# Patient Record
Sex: Male | Born: 1970 | Race: White | Hispanic: No | Marital: Married | Smoking: Never smoker
Health system: Southern US, Community
[De-identification: ages and names within clinical notes are randomized; demographics above are authoritative.]

## PROBLEM LIST (undated history)

## (undated) DIAGNOSIS — I5032 Chronic diastolic (congestive) heart failure: Secondary | ICD-10-CM

## (undated) DIAGNOSIS — R12 Heartburn: Secondary | ICD-10-CM

## (undated) DIAGNOSIS — R600 Localized edema: Secondary | ICD-10-CM

## (undated) DIAGNOSIS — K219 Gastro-esophageal reflux disease without esophagitis: Secondary | ICD-10-CM

## (undated) DIAGNOSIS — I2510 Atherosclerotic heart disease of native coronary artery without angina pectoris: Secondary | ICD-10-CM

## (undated) DIAGNOSIS — M5126 Other intervertebral disc displacement, lumbar region: Secondary | ICD-10-CM

## (undated) DIAGNOSIS — M549 Dorsalgia, unspecified: Secondary | ICD-10-CM

## (undated) DIAGNOSIS — E785 Hyperlipidemia, unspecified: Secondary | ICD-10-CM

## (undated) DIAGNOSIS — I10 Essential (primary) hypertension: Secondary | ICD-10-CM

## (undated) DIAGNOSIS — R609 Edema, unspecified: Secondary | ICD-10-CM

## (undated) DIAGNOSIS — M2550 Pain in unspecified joint: Secondary | ICD-10-CM

## (undated) HISTORY — DX: Dorsalgia, unspecified: M54.9

## (undated) HISTORY — DX: Localized edema: R60.0

## (undated) HISTORY — DX: Pain in unspecified joint: M25.50

## (undated) HISTORY — DX: Heartburn: R12

## (undated) HISTORY — DX: Atherosclerotic heart disease of native coronary artery without angina pectoris: I25.10

## (undated) HISTORY — DX: Edema, unspecified: R60.9

## (undated) HISTORY — DX: Morbid (severe) obesity due to excess calories: E66.01

## (undated) HISTORY — DX: Chronic diastolic (congestive) heart failure: I50.32

## (undated) HISTORY — DX: Hyperlipidemia, unspecified: E78.5

---

## 2000-01-19 ENCOUNTER — Encounter: Admission: RE | Admit: 2000-01-19 | Discharge: 2000-01-19 | Payer: Self-pay | Admitting: Family Medicine

## 2000-01-19 ENCOUNTER — Encounter: Payer: Self-pay | Admitting: Family Medicine

## 2014-12-18 ENCOUNTER — Other Ambulatory Visit: Payer: Self-pay | Admitting: Physician Assistant

## 2014-12-18 ENCOUNTER — Ambulatory Visit
Admission: RE | Admit: 2014-12-18 | Discharge: 2014-12-18 | Disposition: A | Discharge status: Home / Self Care | Payer: 59 | Source: Ambulatory Visit | Attending: Physician Assistant | Admitting: Physician Assistant

## 2014-12-18 DIAGNOSIS — R52 Pain, unspecified: Secondary | ICD-10-CM

## 2015-07-31 ENCOUNTER — Encounter: Payer: Self-pay | Admitting: Cardiology

## 2015-07-31 ENCOUNTER — Ambulatory Visit (INDEPENDENT_AMBULATORY_CARE_PROVIDER_SITE_OTHER): Payer: Commercial Managed Care - HMO | Admitting: Cardiology

## 2015-07-31 ENCOUNTER — Encounter: Payer: Self-pay | Admitting: *Deleted

## 2015-07-31 VITALS — BP 120/72 | HR 89 | Ht >= 80 in | Wt >= 6400 oz

## 2015-07-31 DIAGNOSIS — E785 Hyperlipidemia, unspecified: Secondary | ICD-10-CM | POA: Insufficient documentation

## 2015-07-31 DIAGNOSIS — Z8249 Family history of ischemic heart disease and other diseases of the circulatory system: Secondary | ICD-10-CM

## 2015-07-31 DIAGNOSIS — R079 Chest pain, unspecified: Secondary | ICD-10-CM

## 2015-07-31 DIAGNOSIS — I208 Other forms of angina pectoris: Secondary | ICD-10-CM

## 2015-07-31 MED ORDER — ATORVASTATIN CALCIUM 40 MG PO TABS: 40.0000 mg | ORAL_TABLET | Freq: Every day | ORAL | Status: DC

## 2015-07-31 MED ORDER — NITROGLYCERIN 0.4 MG SL SUBL: 0.4000 mg | SUBLINGUAL_TABLET | SUBLINGUAL | Status: DC | PRN

## 2015-07-31 MED ORDER — ASPIRIN EC 81 MG PO TBEC: 81.0000 mg | DELAYED_RELEASE_TABLET | Freq: Every day | ORAL | Status: AC

## 2015-07-31 NOTE — Progress Notes (Signed)
07/31/2015 Jacob Hunt [facility]   Jul 12, 1970  [id number]  Primary Physician No primary care provider on file. Primary Cardiologist: New (discussed plan with Dr. Patty Sermons)   Reason for Visit/CC: PCP Referral for CP Evaluation   HPI:  The patient is a morbidly obese male who has been referred by [facility] for evaluation for chest pain. His cardiac risk factors include, HLD, obesity and a strong family h/o premature CAD. His mother had a MI at age 64. His father had a MI at age 55. His maternal grandfather suffered a fatal MI and is brother who is 21 y/o recently suffered a MI 1 month ago and got a stent. The patient denies any prior h/o tobacco abuse or DM.  He notes a 3 day history of intermittent substernal chest tightness. Feels like "indigestion". Feels like pressure/burning sensation. Does not radiate. Unrelieved with TUMS. Worse with exertion. Relieved with rest. He is currently CP. EKG shows NSR. BP is well controled at 120/66.   His PCP checked basic labs yesterday. Lipid panel showed an elevated LDL of 132 mg/dL. Hepatic function is normal. BMP showed normal renal function. CBC negative for anemia.      Current Outpatient Prescriptions  Medication Sig Dispense Refill  . aspirin EC 81 MG tablet Take 1 tablet (81 mg total) by mouth daily. 30 tablet 0  . atorvastatin (LIPITOR) 40 MG tablet Take 1 tablet (40 mg total) by mouth daily. 90 tablet 3  . nitroGLYCERIN (NITROSTAT) 0.4 MG SL tablet Place 1 tablet (0.4 mg total) under the tongue every 5 (five) minutes as needed for chest pain. 25 tablet 3   No current facility-administered medications for this visit.    No Known Allergies  Social History   Social History  . Marital Status: Single    Spouse Name: N/A  . Number of Children: N/A  . Years of Education: N/A   Occupational History  . Not on file.   Social History Main Topics  . Smoking status: Never Smoker   . Smokeless tobacco: Not on file  . Alcohol Use:  No  . Drug Use: No  . Sexual Activity: Yes   Other Topics Concern  . Not on file   Social History Narrative     Review of Systems: General: negative for chills, fever, night sweats or weight changes.  Cardiovascular: negative for chest pain, dyspnea on exertion, edema, orthopnea, palpitations, paroxysmal nocturnal dyspnea or shortness of breath Dermatological: negative for rash Respiratory: negative for cough or wheezing Urologic: negative for hematuria Abdominal: negative for nausea, vomiting, diarrhea, bright red blood per rectum, melena, or hematemesis Neurologic: negative for visual changes, syncope, or dizziness All other systems reviewed and are otherwise negative except as noted above.    Blood pressure 120/72, pulse 89, height 7' (2.134 m), weight 419 lb 9.6 oz (190.329 kg).  General appearance: alert, cooperative, no distress and morbidly obese Neck: no carotid bruit and no JVD Lungs: clear to auscultation bilaterally Heart: regular rate and rhythm, S1, S2 normal, no murmur, click, rub or gallop Extremities: no LEE Pulses: 2+ and symmetric Skin: warm and dry Neurologic: Grossly normal  EKG NSR  ASSESSMENT AND PLAN:   1. Exertional Angina: patient with strong family h/o premature CAD, personal history of untreated HLD and morbid obesity w/ symptoms concerning for angina. He is currently CP free. Resting EKG w/o ischemia. BP well controlled. I've discussed case with Dr. Patty Sermons. We will arrange for an outpatient LHC at [facility] on 08/01/15. 81  mg of daily ASA recommended. Rx given for PRN SL NTG.   2. HLD: lipid panel yesterday showed an elevated LDL level of 123 mg/dL. He has cardiac risk factors as well as symptoms concerning for angina. Hepatic function is normal. We will start statin therapy with Lipitor 40 mg. Recheck FLP and HFTs in 6 weeks.   3. Morbid Obesity: weight loss advised, however patient instructed to wait until after cath to engage in physical activity. If  evidence of CAD, we can refer for cardiac rehab.   PLAN  Plan for OP LHC +/- PCI with Dr. Swaziland on 08/02/15.   Robbie Lis PA-C 07/31/2015 3:50 PM

## 2015-07-31 NOTE — Patient Instructions (Addendum)
Medication Instructions:  ATORVASTATIN 40 MG  1 TAB  AT BEDTIME ASPIRIN 81 MG  EVERY DAY  NTG AS  DIRECTED  Labwork: NONE  Testing/Procedures: Your physician has requested that you have a cardiac catheterization. Cardiac catheterization is used to diagnose and/or treat various heart conditions. Doctors may recommend this procedure for a number of different reasons. The most common reason is to evaluate chest pain. Chest pain can be a symptom of coronary artery disease (CAD), and cardiac catheterization can show whether plaque is narrowing or blocking your heart's arteries. This procedure is also used to evaluate the valves, as well as measure the blood flow and oxygen levels in different parts of your heart. For further information please visit [email or web address] Please follow instruction sheet, as given.   Follow-Up:Coronary Angiogram A coronary angiogram, also called coronary angiography, is an X-ray procedure used to look at the arteries in the heart. In this procedure, a dye (contrast dye) is injected through a long, hollow tube (catheter). The catheter is about the size of a piece of cooked spaghetti and is inserted through your groin, wrist, or arm. The dye is injected into each artery, and X-rays are then taken to show if there is a blockage in the arteries of your heart. LET [facility] CARE PROVIDER KNOW ABOUT:  Any allergies you have, including allergies to shellfish or contrast dye.   All medicines you are taking, including vitamins, herbs, eye drops, creams, and over-the-counter medicines.   Previous problems you or members of your family have had with the use of anesthetics.   Any blood disorders you have.   Previous surgeries you have had.  History of kidney problems or failure.   Other medical conditions you have. RISKS AND COMPLICATIONS  Generally, a coronary angiogram is a safe procedure. However, problems can occur and include:  Allergic reaction to the  dye.  Bleeding from the access site or other locations.  Kidney injury, especially in people with impaired kidney function.  Stroke (rare).  Heart attack (rare). BEFORE THE PROCEDURE   Do not eat or drink anything after midnight the night before the procedure or as directed by your health care provider.   Ask your health care provider about changing or stopping your regular medicines. This is especially important if you are taking diabetes medicines or blood thinners. PROCEDURE  You may be given a medicine to help you relax (sedative) before the procedure. This medicine is given through an intravenous (IV) access tube that is inserted into one of your veins.   The area where the catheter will be inserted will be washed and shaved. This is usually done in the groin but may be done in the fold of your arm (near your elbow) or in the wrist.   A medicine will be given to numb the area where the catheter will be inserted (local anesthetic).   The health care provider will insert the catheter into an artery. The catheter will be guided by using a special type of X-ray (fluoroscopy) of the blood vessel being examined.   A special dye will then be injected into the catheter, and X-rays will be taken. The dye will help to show where any narrowing or blockages are located in the heart arteries.  AFTER THE PROCEDURE   If the procedure is done through the leg, you will be kept in bed lying flat for several hours. You will be instructed to not bend or cross your legs.  The insertion site will be  checked frequently.   The pulse in your feet or wrist will be checked frequently.   Additional blood tests, X-rays, and an electrocardiogram may be done.    This information is not intended to replace advice given to you by your health care provider. Make sure you discuss any questions you have with your health care provider.   Document Released: 12/27/2002 Document Revised: 07/13/2014  Document Reviewed: 11/14/2012 Elsevier Interactive Patient Education 2016 [employer].    Any Other Special Instructions Will Be Listed Below (If Applicable). Nitroglycerin sublingual tablets What is this medicine? NITROGLYCERIN (nye troe GLI ser in) is a type of vasodilator. It relaxes blood vessels, increasing the blood and oxygen supply to your heart. This medicine is used to relieve chest pain caused by angina. It is also used to prevent chest pain before activities like climbing stairs, going outdoors in cold weather, or sexual activity. This medicine may be used for other purposes; ask your health care provider or pharmacist if you have questions. What should I tell my health care provider before I take this medicine? They need to know if you have any of these conditions: -anemia -head injury, recent stroke, or bleeding in the brain -liver disease -previous heart attack -an unusual or allergic reaction to nitroglycerin, other medicines, foods, dyes, or preservatives -pregnant or trying to get pregnant -breast-feeding How should I use this medicine? Take this medicine by mouth as needed. At the first sign of an angina attack (chest pain or tightness) place one tablet under your tongue. You can also take this medicine 5 to 10 minutes before an event likely to produce chest pain. Follow the directions on the prescription label. Let the tablet dissolve under the tongue. Do not swallow whole. Replace the dose if you accidentally swallow it. It will help if your mouth is not dry. Saliva around the tablet will help it to dissolve more quickly. Do not eat or drink, smoke or chew tobacco while a tablet is dissolving. If you are not better within 5 minutes after taking ONE dose of nitroglycerin, call 9-1-1 immediately to seek emergency medical care. Do not take more than 3 nitroglycerin tablets over 15 minutes. If you take this medicine often to relieve symptoms of angina, your doctor or health  care professional may provide you with different instructions to manage your symptoms. If symptoms do not go away after following these instructions, it is important to call 9-1-1 immediately. Do not take more than 3 nitroglycerin tablets over 15 minutes. Talk to your pediatrician regarding the use of this medicine in children. Special care may be needed. Overdosage: If you think you have taken too much of this medicine contact a poison control center or emergency room at once. NOTE: This medicine is only for you. Do not share this medicine with others. What if I miss a dose? This does not apply. This medicine is only used as needed. What may interact with this medicine? Do not take this medicine with any of the following medications: -certain migraine medicines like ergotamine and dihydroergotamine (DHE) -medicines used to treat erectile dysfunction like sildenafil, tadalafil, and vardenafil -riociguat This medicine may also interact with the following medications: -alteplase -aspirin -heparin -medicines for high blood pressure -medicines for mental depression -other medicines used to treat angina -phenothiazines like chlorpromazine, mesoridazine, prochlorperazine, thioridazine This list may not describe all possible interactions. Give your health care provider a list of all the medicines, herbs, non-prescription drugs, or dietary supplements you use. Also tell them  if you smoke, drink alcohol, or use illegal drugs. Some items may interact with your medicine. What should I watch for while using this medicine? Tell your doctor or health care professional if you feel your medicine is no longer working. Keep this medicine with you at all times. Sit or lie down when you take your medicine to prevent falling if you feel dizzy or faint after using it. Try to remain calm. This will help you to feel better faster. If you feel dizzy, take several deep breaths and lie down with your feet propped up, or  bend forward with your head resting between your knees. You may get drowsy or dizzy. Do not drive, use machinery, or do anything that needs mental alertness until you know how this drug affects you. Do not stand or sit up quickly, especially if you are an older patient. This reduces the risk of dizzy or fainting spells. Alcohol can make you more drowsy and dizzy. Avoid alcoholic drinks. Do not treat yourself for coughs, colds, or pain while you are taking this medicine without asking your doctor or health care professional for advice. Some ingredients may increase your blood pressure. What side effects may I notice from receiving this medicine? Side effects that you should report to your doctor or health care professional as soon as possible: -blurred vision -dry mouth -skin rash -sweating -the feeling of extreme pressure in the head -unusually weak or tired Side effects that usually do not require medical attention (report to your doctor or health care professional if they continue or are bothersome): -flushing of the face or neck -headache -irregular heartbeat, palpitations -nausea, vomiting This list may not describe all possible side effects. Call your doctor for medical advice about side effects. You may report side effects to FDA at 1-800-FDA-1088. Where should I keep my medicine? Keep out of the reach of children. Store at room temperature between 20 and 25 degrees C (68 and 77 degrees F). Store in Retail buyer. Protect from light and moisture. Keep tightly closed. Throw away any unused medicine after the expiration date. NOTE: This sheet is a summary. It may not cover all possible information. If you have questions about this medicine, talk to your doctor, pharmacist, or health care provider.    2016, Elsevier/Gold Standard. (2013-04-20 17:57:36)      If you need a refill on your cardiac medications before your next appointment, please call your pharmacy.

## 2015-08-02 ENCOUNTER — Encounter (HOSPITAL_COMMUNITY): Payer: Self-pay | Admitting: Cardiology

## 2015-08-02 ENCOUNTER — Ambulatory Visit (HOSPITAL_COMMUNITY)
Admission: RE | Admit: 2015-08-02 | Discharge: 2015-08-02 | Disposition: A | Discharge status: Home / Self Care | Payer: Commercial Managed Care - HMO | Source: Ambulatory Visit | Attending: Cardiology | Admitting: Cardiology

## 2015-08-02 ENCOUNTER — Encounter (HOSPITAL_COMMUNITY)
Admission: RE | Disposition: A | Discharge status: Home / Self Care | Payer: Self-pay | Source: Ambulatory Visit | Attending: Cardiology

## 2015-08-02 DIAGNOSIS — Z7982 Long term (current) use of aspirin: Secondary | ICD-10-CM | POA: Insufficient documentation

## 2015-08-02 DIAGNOSIS — R079 Chest pain, unspecified: Secondary | ICD-10-CM | POA: Diagnosis present

## 2015-08-02 DIAGNOSIS — I208 Other forms of angina pectoris: Secondary | ICD-10-CM | POA: Diagnosis not present

## 2015-08-02 DIAGNOSIS — Z6841 Body Mass Index (BMI) 40.0 and over, adult: Secondary | ICD-10-CM | POA: Diagnosis not present

## 2015-08-02 DIAGNOSIS — Z8249 Family history of ischemic heart disease and other diseases of the circulatory system: Secondary | ICD-10-CM | POA: Diagnosis not present

## 2015-08-02 DIAGNOSIS — E785 Hyperlipidemia, unspecified: Secondary | ICD-10-CM | POA: Diagnosis present

## 2015-08-02 HISTORY — PX: CARDIAC CATHETERIZATION: SHX172

## 2015-08-02 LAB — PROTIME-INR
INR: 1.21 (ref 0.00–1.49)
Prothrombin Time: 15.4 seconds — ABNORMAL HIGH (ref 11.6–15.2)

## 2015-08-02 SURGERY — LEFT HEART CATH AND CORONARY ANGIOGRAPHY

## 2015-08-02 MED ORDER — HEPARIN (PORCINE) IN NACL 2-0.9 UNIT/ML-% IJ SOLN
INTRAMUSCULAR | Status: AC
  Filled 2015-08-02: qty 1000

## 2015-08-02 MED ORDER — SODIUM CHLORIDE 0.9 % IV SOLN: 250.0000 mL | INTRAVENOUS | Status: DC | PRN

## 2015-08-02 MED ORDER — IOHEXOL 350 MG/ML SOLN
INTRAVENOUS | Status: DC | PRN
  Administered 2015-08-02: 80 mL via INTRA_ARTERIAL

## 2015-08-02 MED ORDER — SODIUM CHLORIDE 0.9% FLUSH: 3.0000 mL | Freq: Two times a day (BID) | INTRAVENOUS | Status: DC

## 2015-08-02 MED ORDER — HEPARIN SODIUM (PORCINE) 1000 UNIT/ML IJ SOLN
INTRAMUSCULAR | Status: DC | PRN
  Administered 2015-08-02: 6000 [IU] via INTRAVENOUS

## 2015-08-02 MED ORDER — HEPARIN SODIUM (PORCINE) 1000 UNIT/ML IJ SOLN
INTRAMUSCULAR | Status: AC
  Filled 2015-08-02: qty 1

## 2015-08-02 MED ORDER — SODIUM CHLORIDE 0.9 % WEIGHT BASED INFUSION: 1.0000 mL/kg/h | INTRAVENOUS | Status: DC

## 2015-08-02 MED ORDER — VERAPAMIL HCL 2.5 MG/ML IV SOLN
INTRAVENOUS | Status: AC
  Filled 2015-08-02: qty 2

## 2015-08-02 MED ORDER — LIDOCAINE HCL (PF) 1 % IJ SOLN
INTRAMUSCULAR | Status: DC | PRN
  Administered 2015-08-02: 10:00:00

## 2015-08-02 MED ORDER — SODIUM CHLORIDE 0.9 % WEIGHT BASED INFUSION
3.0000 mL/kg/h | INTRAVENOUS | Status: AC
  Administered 2015-08-02: 3 mL/kg/h via INTRAVENOUS

## 2015-08-02 MED ORDER — LIDOCAINE HCL (PF) 1 % IJ SOLN
INTRAMUSCULAR | Status: AC
  Filled 2015-08-02: qty 30

## 2015-08-02 MED ORDER — MIDAZOLAM HCL 2 MG/2ML IJ SOLN
INTRAMUSCULAR | Status: DC | PRN
  Administered 2015-08-02: 2 mg via INTRAVENOUS

## 2015-08-02 MED ORDER — SODIUM CHLORIDE 0.9% FLUSH
3.0000 mL | INTRAVENOUS | Status: DC | PRN
Start: 2015-08-02 — End: 2015-08-02

## 2015-08-02 MED ORDER — HEPARIN (PORCINE) IN NACL 2-0.9 UNIT/ML-% IJ SOLN
INTRAMUSCULAR | Status: AC
  Filled 2015-08-02: qty 500

## 2015-08-02 MED ORDER — FENTANYL CITRATE (PF) 100 MCG/2ML IJ SOLN
INTRAMUSCULAR | Status: AC
  Filled 2015-08-02: qty 2

## 2015-08-02 MED ORDER — FENTANYL CITRATE (PF) 100 MCG/2ML IJ SOLN
INTRAMUSCULAR | Status: DC | PRN
Start: 2015-08-02 — End: 2015-08-02
  Administered 2015-08-02: 25 ug via INTRAVENOUS

## 2015-08-02 MED ORDER — HEPARIN (PORCINE) IN NACL 2-0.9 UNIT/ML-% IJ SOLN
INTRAMUSCULAR | Status: DC | PRN
  Administered 2015-08-02: 10:00:00 via INTRA_ARTERIAL

## 2015-08-02 MED ORDER — ASPIRIN 81 MG PO CHEW
81.0000 mg | CHEWABLE_TABLET | ORAL | Status: DC
Start: 2015-08-02 — End: 2015-08-02

## 2015-08-02 MED ORDER — MIDAZOLAM HCL 2 MG/2ML IJ SOLN
INTRAMUSCULAR | Status: AC
  Filled 2015-08-02: qty 2

## 2015-08-02 MED ORDER — SODIUM CHLORIDE 0.9% FLUSH: 3.0000 mL | INTRAVENOUS | Status: DC | PRN

## 2015-08-02 SURGICAL SUPPLY — 11 items

## 2015-08-02 NOTE — H&P (View-Only) (Signed)
 07/31/2015 Jacob Hunt   03/17/1971  [id number]  Primary Physician No primary care provider on file. Primary Cardiologist: New (discussed plan with Dr. Brackbill)   Reason for Visit/CC: PCP Referral for CP Evaluation   HPI:  The patient is a morbidly obese male who has been referred by [facility] for evaluation for chest pain. His cardiac risk factors include, HLD, obesity and a strong family h/o premature CAD. His mother had a MI at age 50. His father had a MI at age 60. His maternal grandfather suffered a fatal MI and is brother who is 50 y/o recently suffered a MI 1 month ago and got a stent. The patient denies any prior h/o tobacco abuse or DM.  He notes a 3 day history of intermittent substernal chest tightness. Feels like "indigestion". Feels like pressure/burning sensation. Does not radiate. Unrelieved with TUMS. Worse with exertion. Relieved with rest. He is currently CP. EKG shows NSR. BP is well controled at 120/66.   His PCP checked basic labs yesterday. Lipid panel showed an elevated LDL of 132 mg/dL. Hepatic function is normal. BMP showed normal renal function. CBC negative for anemia.      Current Outpatient Prescriptions  Medication Sig Dispense Refill  . aspirin EC 81 MG tablet Take 1 tablet (81 mg total) by mouth daily. 30 tablet 0  . atorvastatin (LIPITOR) 40 MG tablet Take 1 tablet (40 mg total) by mouth daily. 90 tablet 3  . nitroGLYCERIN (NITROSTAT) 0.4 MG SL tablet Place 1 tablet (0.4 mg total) under the tongue every 5 (five) minutes as needed for chest pain. 25 tablet 3   No current facility-administered medications for this visit.    No Known Allergies  Social History   Social History  . Marital Status: Single    Spouse Name: N/A  . Number of Children: N/A  . Years of Education: N/A   Occupational History  . Not on file.   Social History Main Topics  . Smoking status: Never Smoker   . Smokeless tobacco: Not on file  . Alcohol Use:  No  . Drug Use: No  . Sexual Activity: Yes   Other Topics Concern  . Not on file   Social History Narrative     Review of Systems: General: negative for chills, fever, night sweats or weight changes.  Cardiovascular: negative for chest pain, dyspnea on exertion, edema, orthopnea, palpitations, paroxysmal nocturnal dyspnea or shortness of breath Dermatological: negative for rash Respiratory: negative for cough or wheezing Urologic: negative for hematuria Abdominal: negative for nausea, vomiting, diarrhea, bright red blood per rectum, melena, or hematemesis Neurologic: negative for visual changes, syncope, or dizziness All other systems reviewed and are otherwise negative except as noted above.    Blood pressure 120/72, pulse 89, height 7' (2.134 m), weight 419 lb 9.6 oz (190.329 kg).  General appearance: alert, cooperative, no distress and morbidly obese Neck: no carotid bruit and no JVD Lungs: clear to auscultation bilaterally Heart: regular rate and rhythm, S1, S2 normal, no murmur, click, rub or gallop Extremities: no LEE Pulses: 2+ and symmetric Skin: warm and dry Neurologic: Grossly normal  EKG NSR  ASSESSMENT AND PLAN:   1. Exertional Angina: patient with strong family h/o premature CAD, personal history of untreated HLD and morbid obesity w/ symptoms concerning for angina. He is currently CP free. Resting EKG w/o ischemia. BP well controlled. I've discussed case with Dr. Brackbill. We will arrange for an outpatient LHC at [facility] on 08/01/15. 81   mg of daily ASA recommended. Rx given for PRN SL NTG.   2. HLD: lipid panel yesterday showed an elevated LDL level of 123 mg/dL. He has cardiac risk factors as well as symptoms concerning for angina. Hepatic function is normal. We will start statin therapy with Lipitor 40 mg. Recheck FLP and HFTs in 6 weeks.   3. Morbid Obesity: weight loss advised, however patient instructed to wait until after cath to engage in physical activity. If  evidence of CAD, we can refer for cardiac rehab.   PLAN  Plan for OP LHC +/- PCI with Dr. Jordan on 08/02/15.   SIMMONS, BRITTAINY PA-C 07/31/2015 3:50 PM   

## 2015-08-02 NOTE — Research (Signed)
CADLAD Informed Consent   Subject Name: Jacob Hunt [facility]  Subject met inclusion and exclusion criteria.  The informed consent form, study requirements and expectations were reviewed with the subject and questions and concerns were addressed prior to the signing of the consent form.  The subject verbalized understanding of the trail requirements.  The subject agreed to participate in the CADLAD trial and signed the informed consent.  The informed consent was obtained prior to performance of any protocol-specific procedures for the subject.  A copy of the signed informed consent was given to the subject and a copy was placed in the subject's medical record.  [name] 08/02/2015, 0825 am

## 2015-08-02 NOTE — Interval H&P Note (Signed)
History and Physical Interval Note:  08/02/2015 9:38 AM  Jacob Hunt  has presented today for surgery, with the diagnosis of cp  The various methods of treatment have been discussed with the patient and family. After consideration of risks, benefits and other options for treatment, the patient has consented to  Procedure(s): Left Heart Cath and Coronary Angiography (N/A) as a surgical intervention .  The patient's history has been reviewed, patient examined, no change in status, stable for surgery.  I have reviewed the patient's chart and labs.  Questions were answered to the patient's satisfaction.     Theron [facility] 08/02/2015 9:38 AM Cath Lab Visit (complete for each Cath Lab visit)  Clinical Evaluation Leading to the Procedure:   ACS: No.  Non-ACS:    Anginal Classification: CCS III  Anti-ischemic medical therapy: No Therapy  Non-Invasive Test Results: No non-invasive testing performed  Prior CABG: No previous CABG

## 2015-08-02 NOTE — Discharge Instructions (Signed)
Radial Site Care °Refer to this sheet in the next few weeks. These instructions provide you with information about caring for yourself after your procedure. Your health care provider may also give you more specific instructions. Your treatment has been planned according to current medical practices, but problems sometimes occur. Call your health care provider if you have any problems or questions after your procedure. °WHAT TO EXPECT AFTER THE PROCEDURE °After your procedure, it is typical to have the following: °· Bruising at the radial site that usually fades within 1-2 weeks. °· Blood collecting in the tissue (hematoma) that may be painful to the touch. It should usually decrease in size and tenderness within 1-2 weeks. °HOME CARE INSTRUCTIONS °· Take medicines only as directed by your health care provider. °· You may shower 24-48 hours after the procedure or as directed by your health care provider. Remove the bandage (dressing) and gently wash the site with plain soap and water. Pat the area dry with a clean towel. Do not rub the site, because this may cause bleeding. °· Do not take baths, swim, or use a hot tub until your health care provider approves. °· Check your insertion site every day for redness, swelling, or drainage. °· Do not apply powder or lotion to the site. °· Do not flex or bend the affected arm for 24 hours or as directed by your health care provider. °· Do not push or pull heavy objects with the affected arm for 24 hours or as directed by your health care provider. °· Do not lift over 10 lb (4.5 kg) for 5 days after your procedure or as directed by your health care provider. °· Ask your health care provider when it is okay to: °¨ Return to work or school. °¨ Resume usual physical activities or sports. °¨ Resume sexual activity. °· Do not drive home if you are discharged the same day as the procedure. Have someone else drive you. °· You may drive 24 hours after the procedure unless otherwise  instructed by your health care provider. °· Do not operate machinery or power tools for 24 hours after the procedure. °· If your procedure was done as an outpatient procedure, which means that you went home the same day as your procedure, a responsible adult should be with you for the first 24 hours after you arrive home. °· Keep all follow-up visits as directed by your health care provider. This is important. °SEEK MEDICAL CARE IF: °· You have a fever. °· You have chills. °· You have increased bleeding from the radial site. Hold pressure on the site. °SEEK IMMEDIATE MEDICAL CARE IF: °· You have unusual pain at the radial site. °· You have redness, warmth, or swelling at the radial site. °· You have drainage (other than a small amount of blood on the dressing) from the radial site. °· The radial site is bleeding, and the bleeding does not stop after 30 minutes of holding steady pressure on the site. °· Your arm or hand becomes pale, cool, tingly, or numb. °  °This information is not intended to replace advice given to you by your health care provider. Make sure you discuss any questions you have with your health care provider. °  °Document Released: 07/25/2010 Document Revised: 07/13/2014 Document Reviewed: 01/08/2014 °Elsevier Interactive Patient Education ©2016 Elsevier Inc. ° °

## 2015-08-08 ENCOUNTER — Telehealth: Payer: Self-pay | Admitting: Cardiology

## 2015-08-08 NOTE — Telephone Encounter (Signed)
Pt wife calling was told to call if any problems-pt having some puss coming out of wound site, a little red, and a little swollen, no pain-doesn't look infected pls advise [phone]

## 2015-08-08 NOTE — Telephone Encounter (Signed)
Returned call.  Pt had cath on 1/27 -- now having clear drainage from cath site. This is the first day he has noticed it. Notes mild swelling, mild redness. Denies pain.  Aware I am routing to physician to advise.

## 2015-08-09 NOTE — Telephone Encounter (Signed)
Would ask the patient to come in and have a provider look at the cath site to evaluate ifnit is infected or not. Looks like Dr. Swazil[place] did the cath.  Dr. Rennis Golden

## 2015-08-09 NOTE — Telephone Encounter (Signed)
Returned call. Phone rings w/ no answer or VM pickup. 

## 2016-03-20 ENCOUNTER — Other Ambulatory Visit: Payer: Self-pay | Admitting: Orthopaedic Surgery

## 2016-03-20 DIAGNOSIS — T1590XA Foreign body on external eye, part unspecified, unspecified eye, initial encounter: Secondary | ICD-10-CM

## 2016-03-20 DIAGNOSIS — M5416 Radiculopathy, lumbar region: Secondary | ICD-10-CM

## 2016-03-20 DIAGNOSIS — M545 Low back pain: Secondary | ICD-10-CM

## 2016-03-23 ENCOUNTER — Ambulatory Visit
Admission: RE | Admit: 2016-03-23 | Discharge: 2016-03-23 | Disposition: A | Discharge status: Home / Self Care | Payer: Commercial Managed Care - HMO | Source: Ambulatory Visit | Attending: Orthopaedic Surgery | Admitting: Orthopaedic Surgery

## 2016-03-23 DIAGNOSIS — M545 Low back pain: Secondary | ICD-10-CM

## 2016-03-23 DIAGNOSIS — T1590XA Foreign body on external eye, part unspecified, unspecified eye, initial encounter: Secondary | ICD-10-CM

## 2016-03-23 DIAGNOSIS — M5416 Radiculopathy, lumbar region: Secondary | ICD-10-CM

## 2016-08-10 DIAGNOSIS — G479 Sleep disorder, unspecified: Secondary | ICD-10-CM | POA: Diagnosis not present

## 2017-03-15 ENCOUNTER — Other Ambulatory Visit (HOSPITAL_COMMUNITY): Payer: Self-pay

## 2017-03-15 ENCOUNTER — Observation Stay (HOSPITAL_COMMUNITY)
Admission: EM | Admit: 2017-03-15 | Discharge: 2017-03-16 | Disposition: A | Discharge status: Home / Self Care | Payer: 59 | Attending: Cardiology | Admitting: Cardiology

## 2017-03-15 ENCOUNTER — Other Ambulatory Visit: Payer: Self-pay

## 2017-03-15 ENCOUNTER — Encounter (HOSPITAL_COMMUNITY)
Admission: EM | Disposition: A | Discharge status: Home / Self Care | Payer: Self-pay | Source: Home / Self Care | Attending: Physician Assistant

## 2017-03-15 ENCOUNTER — Telehealth: Payer: Self-pay | Admitting: Cardiology

## 2017-03-15 ENCOUNTER — Encounter (HOSPITAL_COMMUNITY): Payer: Self-pay | Admitting: *Deleted

## 2017-03-15 ENCOUNTER — Emergency Department (HOSPITAL_COMMUNITY): Payer: 59

## 2017-03-15 DIAGNOSIS — Z7982 Long term (current) use of aspirin: Secondary | ICD-10-CM | POA: Diagnosis not present

## 2017-03-15 DIAGNOSIS — Z8249 Family history of ischemic heart disease and other diseases of the circulatory system: Secondary | ICD-10-CM

## 2017-03-15 DIAGNOSIS — Z6841 Body Mass Index (BMI) 40.0 and over, adult: Secondary | ICD-10-CM | POA: Diagnosis not present

## 2017-03-15 DIAGNOSIS — I214 Non-ST elevation (NSTEMI) myocardial infarction: Secondary | ICD-10-CM

## 2017-03-15 DIAGNOSIS — R079 Chest pain, unspecified: Secondary | ICD-10-CM | POA: Diagnosis not present

## 2017-03-15 DIAGNOSIS — I10 Essential (primary) hypertension: Secondary | ICD-10-CM

## 2017-03-15 DIAGNOSIS — E876 Hypokalemia: Secondary | ICD-10-CM | POA: Diagnosis not present

## 2017-03-15 DIAGNOSIS — R7989 Other specified abnormal findings of blood chemistry: Principal | ICD-10-CM | POA: Insufficient documentation

## 2017-03-15 DIAGNOSIS — E782 Mixed hyperlipidemia: Secondary | ICD-10-CM

## 2017-03-15 DIAGNOSIS — E785 Hyperlipidemia, unspecified: Secondary | ICD-10-CM | POA: Insufficient documentation

## 2017-03-15 DIAGNOSIS — R778 Other specified abnormalities of plasma proteins: Secondary | ICD-10-CM | POA: Diagnosis present

## 2017-03-15 DIAGNOSIS — R748 Abnormal levels of other serum enzymes: Secondary | ICD-10-CM | POA: Diagnosis not present

## 2017-03-15 HISTORY — DX: Essential (primary) hypertension: I10

## 2017-03-15 HISTORY — DX: Gastro-esophageal reflux disease without esophagitis: K21.9

## 2017-03-15 HISTORY — DX: Other intervertebral disc displacement, lumbar region: M51.26

## 2017-03-15 HISTORY — PX: LEFT HEART CATH AND CORONARY ANGIOGRAPHY: CATH118249

## 2017-03-15 HISTORY — PX: CARDIAC CATHETERIZATION: SHX172

## 2017-03-15 LAB — BASIC METABOLIC PANEL
Anion gap: 8 (ref 5–15)
BUN: 10 mg/dL (ref 6–20)
CALCIUM: 9.5 mg/dL (ref 8.9–10.3)
CO2: 24 mmol/L (ref 22–32)
CREATININE: 0.82 mg/dL (ref 0.61–1.24)
Chloride: 104 mmol/L (ref 101–111)
GFR calc Af Amer: >60 mL/min (ref >60)
GFR calc non Af Amer: >60 mL/min (ref >60)
GLUCOSE: 118 mg/dL — AB (ref 65–99)
POTASSIUM: 4.7 mmol/L (ref 3.5–5.1)
Sodium: 136 mmol/L (ref 135–145)

## 2017-03-15 LAB — CBC
HCT: 43.5 % (ref 39.0–52.0)
Hemoglobin: 14.6 g/dL (ref 13.0–17.0)
MCH: 29.8 pg (ref 26.0–34.0)
MCHC: 33.6 g/dL (ref 30.0–36.0)
MCV: 88.8 fL (ref 78.0–100.0)
PLATELETS: 239 10*3/uL (ref 150–400)
RBC: 4.9 MIL/uL (ref 4.22–5.81)
RDW: 12.9 % (ref 11.5–15.5)
WBC: 7.3 10*3/uL (ref 4.0–10.5)

## 2017-03-15 LAB — TROPONIN I: Troponin I: 6.29 ng/mL (ref <0.03)

## 2017-03-15 LAB — I-STAT TROPONIN, ED: TROPONIN I, POC: 0.66 ng/mL — AB (ref 0.00–0.08)

## 2017-03-15 LAB — PROTIME-INR
INR: 1.19
Prothrombin Time: 15 seconds (ref 11.4–15.2)

## 2017-03-15 SURGERY — LEFT HEART CATH AND CORONARY ANGIOGRAPHY
Anesthesia: LOCAL

## 2017-03-15 MED ORDER — SODIUM CHLORIDE 0.9 % IV SOLN: INTRAVENOUS | Status: AC

## 2017-03-15 MED ORDER — HEPARIN (PORCINE) IN NACL 100-0.45 UNIT/ML-% IJ SOLN
1600.0000 [IU]/h | INTRAMUSCULAR | Status: DC
  Administered 2017-03-15: 1600 [IU]/h via INTRAVENOUS
  Filled 2017-03-15 (×2): qty 250

## 2017-03-15 MED ORDER — ACETAMINOPHEN 325 MG PO TABS: 650.0000 mg | ORAL_TABLET | ORAL | Status: DC | PRN

## 2017-03-15 MED ORDER — SODIUM CHLORIDE 0.9% FLUSH: 3.0000 mL | INTRAVENOUS | Status: DC | PRN

## 2017-03-15 MED ORDER — ATORVASTATIN CALCIUM 80 MG PO TABS
80.0000 mg | ORAL_TABLET | Freq: Every day | ORAL | Status: DC
  Administered 2017-03-15: 80 mg via ORAL
  Filled 2017-03-15 (×2): qty 1

## 2017-03-15 MED ORDER — ASPIRIN EC 81 MG PO TBEC
81.0000 mg | DELAYED_RELEASE_TABLET | Freq: Every day | ORAL | Status: DC
Start: 2017-03-16 — End: 2017-03-16
  Administered 2017-03-16: 11:00:00 81 mg via ORAL
  Filled 2017-03-15: qty 1

## 2017-03-15 MED ORDER — SODIUM CHLORIDE 0.9 % IV SOLN: INTRAVENOUS | Status: DC

## 2017-03-15 MED ORDER — ONDANSETRON HCL 4 MG/2ML IJ SOLN: 4.0000 mg | Freq: Four times a day (QID) | INTRAMUSCULAR | Status: DC | PRN

## 2017-03-15 MED ORDER — IOPAMIDOL (ISOVUE-370) INJECTION 76%
INTRAVENOUS | Status: AC
  Filled 2017-03-15: qty 100

## 2017-03-15 MED ORDER — SODIUM CHLORIDE 0.9% FLUSH
3.0000 mL | Freq: Two times a day (BID) | INTRAVENOUS | Status: DC
  Administered 2017-03-16: 3 mL via INTRAVENOUS

## 2017-03-15 MED ORDER — SODIUM CHLORIDE 0.9 % IV SOLN: 250.0000 mL | INTRAVENOUS | Status: DC | PRN

## 2017-03-15 MED ORDER — HEPARIN SODIUM (PORCINE) 1000 UNIT/ML IJ SOLN
INTRAMUSCULAR | Status: AC
  Filled 2017-03-15: qty 1

## 2017-03-15 MED ORDER — HEPARIN BOLUS VIA INFUSION
4000.0000 [IU] | Freq: Once | INTRAVENOUS | Status: AC
  Administered 2017-03-15: 4000 [IU] via INTRAVENOUS
  Filled 2017-03-15: qty 4000

## 2017-03-15 MED ORDER — HEPARIN SODIUM (PORCINE) 1000 UNIT/ML IJ SOLN
INTRAMUSCULAR | Status: DC | PRN
  Administered 2017-03-15: 8000 [IU] via INTRAVENOUS

## 2017-03-15 MED ORDER — NITROGLYCERIN 0.4 MG SL SUBL
0.4000 mg | SUBLINGUAL_TABLET | SUBLINGUAL | Status: DC | PRN
Start: 2017-03-15 — End: 2017-03-16

## 2017-03-15 MED ORDER — ASPIRIN 81 MG PO CHEW: 81.0000 mg | CHEWABLE_TABLET | ORAL | Status: DC

## 2017-03-15 MED ORDER — FENTANYL CITRATE (PF) 100 MCG/2ML IJ SOLN
INTRAMUSCULAR | Status: AC
  Filled 2017-03-15: qty 2

## 2017-03-15 MED ORDER — VERAPAMIL HCL 2.5 MG/ML IV SOLN
INTRAVENOUS | Status: DC | PRN
  Administered 2017-03-15: 18:00:00 via INTRA_ARTERIAL

## 2017-03-15 MED ORDER — HEPARIN (PORCINE) IN NACL 2-0.9 UNIT/ML-% IJ SOLN
INTRAMUSCULAR | Status: AC
  Filled 2017-03-15: qty 1000

## 2017-03-15 MED ORDER — SODIUM CHLORIDE 0.9% FLUSH: 3.0000 mL | Freq: Two times a day (BID) | INTRAVENOUS | Status: DC

## 2017-03-15 MED ORDER — LOSARTAN POTASSIUM 25 MG PO TABS
25.0000 mg | ORAL_TABLET | Freq: Every day | ORAL | Status: DC
  Administered 2017-03-16: 11:00:00 25 mg via ORAL
  Filled 2017-03-15: qty 1

## 2017-03-15 MED ORDER — ASPIRIN EC 81 MG PO TBEC
81.0000 mg | DELAYED_RELEASE_TABLET | Freq: Every day | ORAL | Status: DC
  Administered 2017-03-15: 81 mg via ORAL
  Filled 2017-03-15: qty 1

## 2017-03-15 MED ORDER — LIDOCAINE HCL (PF) 1 % IJ SOLN
INTRAMUSCULAR | Status: DC | PRN
  Administered 2017-03-15: 4 mL via INTRADERMAL

## 2017-03-15 MED ORDER — MIDAZOLAM HCL 2 MG/2ML IJ SOLN
INTRAMUSCULAR | Status: DC | PRN
  Administered 2017-03-15: 2 mg via INTRAVENOUS

## 2017-03-15 MED ORDER — LIDOCAINE HCL (PF) 1 % IJ SOLN
INTRAMUSCULAR | Status: AC
  Filled 2017-03-15: qty 30

## 2017-03-15 MED ORDER — VERAPAMIL HCL 2.5 MG/ML IV SOLN
INTRAVENOUS | Status: AC
  Filled 2017-03-15: qty 2

## 2017-03-15 MED ORDER — IOPAMIDOL (ISOVUE-370) INJECTION 76%
INTRAVENOUS | Status: DC | PRN
  Administered 2017-03-15: 95 mL via INTRA_ARTERIAL

## 2017-03-15 MED ORDER — HEPARIN (PORCINE) IN NACL 2-0.9 UNIT/ML-% IJ SOLN
INTRAMUSCULAR | Status: AC | PRN
  Administered 2017-03-15: 1000 mL via INTRA_ARTERIAL

## 2017-03-15 MED ORDER — FENTANYL CITRATE (PF) 100 MCG/2ML IJ SOLN
INTRAMUSCULAR | Status: DC | PRN
  Administered 2017-03-15: 25 ug via INTRAVENOUS

## 2017-03-15 MED ORDER — MIDAZOLAM HCL 2 MG/2ML IJ SOLN
INTRAMUSCULAR | Status: AC
  Filled 2017-03-15: qty 2

## 2017-03-15 SURGICAL SUPPLY — 13 items
CATH EXPO 5F FL3.5 (CATHETERS) ×1 IMPLANT
CATH INFINITI 5FR ANG PIGTAIL (CATHETERS) ×2 IMPLANT
CATH INFINITI JR4 5F (CATHETERS) ×2 IMPLANT
DEVICE RAD COMP TR BAND LRG (VASCULAR PRODUCTS) ×2 IMPLANT
GLIDESHEATH SLEND SS 6F .021 (SHEATH) ×2 IMPLANT
GUIDEWIRE INQWIRE 1.5J.035X260 (WIRE) ×1 IMPLANT
HOVERMATT SINGLE USE (MISCELLANEOUS) ×2 IMPLANT
INQWIRE 1.5J .035X260CM (WIRE) ×2
KIT HEART LEFT (KITS) ×2 IMPLANT
PACK CARDIAC CATHETERIZATION (CUSTOM PROCEDURE TRAY) ×2 IMPLANT
SYR MEDRAD MARK V 150ML (SYRINGE) ×2 IMPLANT
TRANSDUCER W/STOPCOCK (MISCELLANEOUS) ×2 IMPLANT
TUBING CIL FLEX 10 FLL-RA (TUBING) ×2 IMPLANT

## 2017-03-15 NOTE — ED Provider Notes (Signed)
[facility]Y DEPT Provider Note   CSN: [id number] Arrival date & time: 03/15/17  1059     History   Chief Complaint Chief Complaint  Patient presents with  . Chest Pain    HPI Jacob Hunt is a 46 y.o. male.  HPI   Patient's 46 year old male presenting with chest pain. Patient has morbid obesity, and cardiac disease early in family members, hypokalemia. Patient was seen by Dr. Patty SermonsBrackbill in 2017 and had a cardiac catheterization for risk factors and symptoms of indigestion. Cath shows LAD asked ectasia otherwise normal. Patient has been having increasing indigestion indigestion symptoms at home. This morning the chest pain woke from sleep. At 6 AM. This has gotten much better. No risk factors for pulmonary embolism such as long travel, prolonged immobilization or unilateral leg swelling.   Past Medical History:  Diagnosis Date  . Morbid obesity [facility](HCC)     Patient Active Problem List   Diagnosis Date Noted  . Exertional angina (HCC) 07/31/2015  . HLD (hyperlipidemia) 07/31/2015  . Family history of premature CAD 07/31/2015  . Morbid obesity (HCC)     Past Surgical History:  Procedure Laterality Date  . CARDIAC CATHETERIZATION N/A 08/02/2015   Procedure: Left Heart Cath and Coronary Angiography;  Surgeon: Peter M SwazilandJordan, MD;  [facility];  Service: Cardiovascular;  Laterality: N/A;  . NO PAST SURGERIES         Home Medications    Prior to Admission medications   Medication Sig Start Date End Date Taking? Authorizing Provider  aspirin EC 81 MG tablet Take 1 tablet (81 mg total) by mouth daily. 07/31/15   Robbie LisSimmons, Brittainy M, PA-C  atorvastatin (LIPITOR) 40 MG tablet Take 1 tablet (40 mg total) by mouth daily. 07/31/15   Robbie LisSimmons, Brittainy M, PA-C  nitroGLYCERIN (NITROSTAT) 0.4 MG SL tablet Place 1 tablet (0.4 mg total) under the tongue every 5 (five) minutes as needed for chest pain. 07/31/15   Allayne ButcherSimmons, Brittainy M, PA-C    Family History Family  History  Problem Relation Age of Onset  . Diabetes Mother        AGE 44  . Hypertension Mother   . CAD Mother   . Cancer Mother        BREAST  . Diabetes Father        AGE 90  . Hypertension Father   . CAD Father   . Diabetes Brother        AGE 60  . Hypertension Brother   . CAD Brother   . Cancer Maternal Aunt        BREAST  . Coronary artery disease Maternal Grandfather   . Cancer Maternal Grandfather        BREAST  . Other Other        FX HX OF AODM    Social History Social History  Substance Use Topics  . Smoking status: Never Smoker  . Smokeless tobacco: Never Used  . Alcohol use No     Allergies   Patient has no known allergies.   Review of Systems Review of Systems  Constitutional: Negative for activity change.  Respiratory: Negative for shortness of breath.   Cardiovascular: Positive for chest pain.  Gastrointestinal: Negative for abdominal pain.     Physical Exam Updated Vital Signs BP (!) 143/75 (BP Location: Left Wrist)   Pulse 64   Temp 97.6 F (36.4 C) (Oral)   Resp (!) 27   SpO2 98%   Physical Exam  Constitutional: He is oriented to person, place, and time. He appears well-nourished.  Morbid obesity  HENT:  Head: Normocephalic.  Eyes: Conjunctivae are normal.  Cardiovascular: Normal rate and regular rhythm.   No murmur heard. Pulmonary/Chest: Effort normal and breath sounds normal. No respiratory distress. He has no wheezes. He has no rales.  Abdominal: Soft. He exhibits no distension. There is no tenderness.  Neurological: He is oriented to person, place, and time.  Skin: Skin is warm and dry. He is not diaphoretic.  Psychiatric: He has a normal mood and affect. His behavior is normal.     ED Treatments / Results  Labs (all labs ordered are listed, but only abnormal results are displayed) Labs Reviewed  BASIC METABOLIC PANEL - Abnormal; Notable for the following:       Result Value   Glucose, Bld 118 (*)    All other  components within normal limits  I-STAT TROPONIN, ED - Abnormal; Notable for the following:    Troponin i, poc 0.66 (*)    All other components within normal limits  CBC    EKG  EKG Interpretation  Date/Time:  Monday March 15 2017 11:26:27 EDT Ventricular Rate:  63 PR Interval:  204 QRS Duration: 86 QT Interval:  402 QTC Calculation: 411 R Axis:   36 Text Interpretation:  Normal sinus rhythm Normal ECG Normal sinus rhythm Confirmed by Corlis Leak, Courteney ([id number]) on 03/15/2017 12:11:00 PM       Radiology Dg Chest 2 View  Result Date: 03/15/2017 CLINICAL DATA:  46 year old male with a history of chest pain EXAM: CHEST  2 VIEW COMPARISON:  None. FINDINGS: The heart size and mediastinal contours are within normal limits. Both lungs are clear. The visualized skeletal structures are unremarkable. IMPRESSION: No radiographic evidence of acute cardiopulmonary disease Electronically Signed   By: Gilmer Mor D.O.   On: 03/15/2017 12:01    Procedures Procedures (including critical care time)  Medications Ordered in ED Medications - No data to display   Initial Impression / Assessment and Plan / ED Course  I have reviewed the triage vital signs and the nursing notes.  Pertinent labs & imaging results that were available during my care of the patient were reviewed by me and considered in my medical decision making (see chart for details).      Patient's 46 year old male presenting with chest pain. Patient has morbid obesity, and cardiac disease early in family members, hypokalemia. Patient was seen by Dr. Patty Sermons in 2017 and had a cardiac catheterization for risk factors and symptoms of indigestion. Cath shows LAD asked ectasia otherwise normal. Patient has been having increasing indigestion indigestion symptoms at home. This morning the chest pain woke from sleep. At 6 AM. This has gotten much better. No risk factors for pulmonary embolism such as long travel, prolonged  immobilization or unilateral leg swelling.   1:12 PM Will start heparin, repeat troponins, discuss with cardiology.   CRITICAL CARE Performed by: Arlana Hove Total critical care time: 60 minutes Critical care time was exclusive of separately billable procedures and treating other patients. Critical care was necessary to treat or prevent imminent or life-threatening deterioration. Critical care was time spent personally by me on the following activities: development of treatment plan with patient and/or surrogate as well as nursing, discussions with consultants, evaluation of patient's response to treatment, examination of patient, obtaining history from patient or surrogate, ordering and performing treatments and interventions, ordering and review of laboratory studies, ordering and review of radiographic  studies, pulse oximetry and re-evaluation of patient's condition.   Final Clinical Impressions(s) / ED Diagnoses   Final diagnoses:  None    New Prescriptions New Prescriptions   No medications on file     Abelino Derrick, MD 03/18/17 [phone]

## 2017-03-15 NOTE — ED Triage Notes (Signed)
Pt states he was awoke around 5am with chest discomfort and burning and radiated to his throat. Pt states he has been dealing with reflux and he has tried everything and it is not going away.  Pt has family history of heart disease.

## 2017-03-15 NOTE — H&P (Signed)
Expand All Collapse All   Hide copied text   Cardiology H&P   Patient ID: Jacob Hunt; [id number]; [birth year]   Admit date: 03/15/2017 Date of Consult: 03/15/2017  Primary Care Provider: Clovis Riley, L.August Saucer, MD Primary Cardiologist: New (Seen by Dr. Patty Sermons in 2017)   Patient Profile:   Jacob Hunt is a 46 y.o. male with a hx of normal coronary arteries by cath 07/2015, morbid obesity, family h/o premature CAD and HLD who is being seen today for the evaluation of chest pain and elevated troponin at the request of Dr. Corlis Leak, Emergency Medicine.  History of Present Illness:   Pt was first seen in our clinic on 07/30/16 as a new patient for evaluation of exertional dyspnea and chest pain, in the setting of risk factors including, HLD and family h/o premature CAD. His mother had a MI at age 66. His father had a MI at age 32. His maternal grandfather suffered a fatal MI and is brother also suffered an MI at age 69. Dr. Patty Sermons was DOD and recommended definitive LHC. Cath was performed by Dr. Swaziland on 08/02/15 and showed ectasia of the proximal to mid LAD, but otherwise normal coronary angiography and normal LV function. His symptoms were felt to be noncardiac and continued risk factor reduction was recommended.   He presented to the [facility] ED earlier this morning with complaint of CP, feeling like indigestion. Started ~5 AM this morning. Substernal and feels like a "gas bubble". He has had frequent belching which leads to mild relief but no complete resolution. He has also tried [place] but only 2 tablets. No relief. Discomfort is worse in supine position and relieved sitting up wright. He denies any associated dyspnea, diaphoresis, dizziness, syncope/ near syncope. The discomfort does not change with exertion. He notes he ate spicy chicken fajitas last night.  Pain inially was 6/10. Now 2/10.   EKG shows NSR w/ low voltage QRS complexes, likely secondary to body habits/ obesity.  Initialb POC troponin abnormal at 0.66. CBC and BMP unremarkable. SCr WNL at 0.82.  CXR w/o radiographic evidence of acute cardiopulmonary disease. He has been started on IV heparin.       Past Medical History:  Diagnosis Date  . Morbid obesity (HCC)          Past Surgical History:  Procedure Laterality Date  . CARDIAC CATHETERIZATION N/A 08/02/2015   Procedure: Left Heart Cath and Coronary Angiography;  Surgeon: Peter M Swaziland, MD;  Location: [facility] INVASIVE CV LAB;  Service: Cardiovascular;  Laterality: N/A;  . NO PAST SURGERIES       Home Medications:         Prior to Admission medications   Medication Sig Start Date End Date Taking? Authorizing Provider  ibuprofen (ADVIL,MOTRIN) 200 MG tablet Take 200 mg by mouth every 6 (six) hours as needed for moderate pain.   Yes [provider]  nitroGLYCERIN (NITROSTAT) 0.4 MG SL tablet Place 1 tablet (0.4 mg total) under the tongue every 5 (five) minutes as needed for chest pain. 07/31/15  Yes Robbie Lis M, PA-C  aspirin EC 81 MG tablet Take 1 tablet (81 mg total) by mouth daily. Patient not taking: Reported on 03/15/2017 07/31/15   Robbie Lis M, PA-C  atorvastatin (LIPITOR) 40 MG tablet Take 1 tablet (40 mg total) by mouth daily. Patient not taking: Reported on 03/15/2017 07/31/15   Allayne Butcher, PA-C    Inpatient Medications: Scheduled Meds:  Continuous Infusions:  PRN Meds:  Allergies:   No Known Allergies  Social History:   Social History        Social History  . Marital status: Married    Spouse name: N/A  . Number of children: N/A  . Years of education: N/A      Occupational History  . Not on file.       Social History Main Topics  . Smoking status: Never Smoker  . Smokeless tobacco: Never Used  . Alcohol use No  . Drug use: No  . Sexual activity: Yes       Other Topics Concern  . Not on file      Social History Narrative  . No narrative on file      Family History:         Family History  Problem Relation Age of Onset  . Diabetes Mother        AGE 28  . Hypertension Mother   . CAD Mother   . Cancer Mother        BREAST  . Diabetes Father        AGE 42  . Hypertension Father   . CAD Father   . Diabetes Brother        AGE 90  . Hypertension Brother   . CAD Brother   . Cancer Maternal Aunt        BREAST  . Coronary artery disease Maternal Grandfather   . Cancer Maternal Grandfather        BREAST  . Other Other        FX HX OF AODM     ROS:  Please see the history of present illness.  ROS  All other ROS reviewed and negative.     Physical Exam/Data:         Vitals:   03/15/17 1224 03/15/17 1225 03/15/17 1245 03/15/17 1300  BP: (!) 143/75 (!) 143/75  118/66  Pulse: 61 64 67 66  Resp: 16 (!) Temp:  97.6 F (36.4 C)    TempSrc:  Oral    SpO2: 97% 98% 98% 95%  Weight:    (!) 428 lb 5.7 oz (194.3 kg)  Height:    5' 7.5" (1.715 m)   No intake or output data in the 24 hours ending 03/15/17 1322    Filed Weights   03/15/17 1300  Weight: (!) 428 lb 5.7 oz (194.3 kg)   Body mass index is 66.1 kg/m.  General:  Well nourished, well developed, in no acute distress, morbidly obese  HEENT: normal Lymph: no adenopathy Neck: no JVD Endocrine:  No thryomegaly Vascular: No carotid bruits; FA pulses 2+ bilaterally without bruits  Cardiac:  normal S1, S2; RRR; no murmur  Lungs:  clear to auscultation bilaterally, no wheezing, rhonchi or rales  Abd: soft, nontender, no hepatomegaly  Ext: no edema Musculoskeletal:  No deformities, BUE and BLE strength normal and equal Skin: warm and dry  Neuro:  CNs 2-12 intact, no focal abnormalities noted Psych:  Normal affect   EKG:  The EKG was personally reviewed and demonstrates:  NSR with low QRS complexes Telemetry:  Telemetry was personally reviewed and demonstrates:  NSR  Relevant CV Studies: LHC 08/01/16 - normal  coronaries and normal LVEF (see cath report in CV procedures for details)  Laboratory Data:  Chemistry Last Labs    Recent Labs Lab 03/15/17 1139  NA 136  K 4.7  CL 104  CO2 24  GLUCOSE 118*  BUN 10  CREATININE 0.82  CALCIUM 9.5  GFRNONAA >60  GFRAA >60  ANIONGAP 8      Last Labs   No results for input(s): PROT, ALBUMIN, AST, ALT, ALKPHOS, BILITOT in the last 168 hours.   Hematology Last Labs    Recent Labs Lab 03/15/17 1139  WBC 7.3  RBC 4.90  HGB 14.6  HCT 43.5  MCV 88.8  MCH 29.8  MCHC 33.6  RDW 12.9  PLT 239     Cardiac Enzymes Last Labs   No results for input(s): TROPONINI in the last 168 hours.    Recent Labs Lab 03/15/17 1148  TROPIPOC 0.66*    BNP Last Labs   No results for input(s): BNP, PROBNP in the last 168 hours.    DDimer  Last Labs   No results for input(s): DDIMER in the last 168 hours.    Radiology/Studies:  Dg Chest 2 View  Result Date: 03/15/2017 CLINICAL DATA:  46 year old male with a history of chest pain EXAM: CHEST  2 VIEW COMPARISON:  None. FINDINGS: The heart size and mediastinal contours are within normal limits. Both lungs are clear. The visualized skeletal structures are unremarkable. IMPRESSION: No radiographic evidence of acute cardiopulmonary disease Electronically Signed   By: Gilmer MorJaime  Wagner D.O.   On: 03/15/2017 12:01    Assessment and Plan:   1. Chest Pain w/ Elevated Troponin: pt had LHC 07/2015 that showed normal coronaries and normal LVEF. Now back with CP that seems more c/w GI etiology, however in the presence of abnormal troponin at 0.66, which is concerning. He has been started on IV heparin. He will need admission for observation and further w/u. We will continue to cycle cardiac enzymes to assess trend and will obtain a 2D echo. Unfortunately, he is not a good candidate for noninvasive stress testing for risk stratification given his morbid obesity/ body habitus Body mass index is 66.1 kg/m., 428  lb. Thus repeat LHC would be the only way to exclude ACS. If no culprit lesions to explain is chest pain and elevated troponin, then suspect GI etiology/GERD. Can try GI cocktail and PPI. MD to follow with further recs.     For questions or updates, please contact [facility] Please consult [email or web address] for contact info under Cardiology/STEMI. Daytime calls, contact the Day Call APP (6a-8a) or assigned team (Teams A-D) provider (7:30a - 5p). All other daytime calls (7:30-5p), contact the Card Master @ [phone].  Nighttime calls, contact the assigned APP (5p-8p) or MD (6:30p-8p). Overnight calls (8p-6a), contact the on call Fellow @ [phone].   Signed, Robbie LisBrittainy Simmons, PA-C  03/15/2017 1:22 PM   The patient was seen, examined and discussed with Brittainy M. Sharol HarnessSimmons, PA-C and I agree with the above.   46 y.o. male with a hx of normal coronary arteries by cath 07/2015, morbid obesity, severe family h/o premature CAD with MI in his father, mother, brother at age 44, and HLD who is being seen today for the evaluation of chest pain and elevated troponin of 0.66. His ECG is normal. He is morbidly obese and not active. A year ago he had negative troponin, today 0.66. With his extensive FH of an early CAD we will repeat cardiac cath. He also didn't follow with statins or ASA. The mainstay of therapy would also be weight loss. Dietitian classes. I will start losartan 25 mg po daily for hypertension.   Tobias AlexanderKatarina Ahjanae Cassel, MD 03/15/2017

## 2017-03-15 NOTE — Telephone Encounter (Signed)
Patient's wife calling and states that the patient has been having sharp, crushing, continuous 9/10 pain in the center of his chest that radiates into his throat. She states that his pain has been going on since 5am and that he feels nauseous. Advised for the patient to be seen in the ER. Wife verbalized understanding and was in agreement.

## 2017-03-15 NOTE — ED Notes (Addendum)
CRITICAL VALUE ALERT  Critical Value:  Troponin 0.66  Date & Time Notied:  03/15/2017 1202   Provider Notified: Dr. Clarene DukeLittle  Orders Received/Actions taken: Will get pt back to room asap. Notified receiving RN , Baird LyonsCasey to do repeat EKG once pt gets to room.

## 2017-03-15 NOTE — Progress Notes (Signed)
ANTICOAGULATION CONSULT NOTE - Initial Consult  Pharmacy Consult for heparin Indication: chest pain/ACS  No Known Allergies  Patient Measurements: Height: 5' 7.5" (171.5 cm) Weight: (!) 428 lb 5.7 oz (194.3 kg) IBW/kg (Calculated) : 67.25 Heparin Dosing Weight: 117kg  Vital Signs: Temp: 97.6 F (36.4 C) (09/10 1225) Temp Source: Oral (09/10 1225) BP: 118/66 (09/10 1300) Pulse Rate: 66 (09/10 1300)  Labs:  Recent Labs  03/15/17 1139  HGB 14.6  HCT 43.5  PLT 239  CREATININE 0.82    Estimated Creatinine Clearance: 194 mL/min (by C-G formula based on SCr of 0.82 mg/dL).   Medical History: Past Medical History:  Diagnosis Date  . Morbid obesity (HCC)     Medications:  Infusions:  . heparin      Assessment: 45 yom presented to the ED with CP. Troponin elevated and now starting IV heparin. Baseline CBC is WNL and [name].   Goal of Therapy:  Heparin level 0.3-0.7 units/ml Monitor platelets by anticoagulation protocol: Yes   Plan:  Heparin bolus 4000 units IV x 1 Heparin gtt 1600 units/hr Check a 6 hr heparin level Daily heparin level and CBC  Jacob Hunt, Jacob Hunt 03/15/2017,1:26 PM

## 2017-03-15 NOTE — Telephone Encounter (Signed)
New message   Pt wife if calling for pt.  Pt c/o of Chest Pain: STAT if CP now or developed within 24 hours  1. Are you having CP right now? Yes per wife, sharp in the center of his chest into his throat.  2. Are you experiencing any other symptoms (ex. SOB, nausea, vomiting, sweating)? No   3. How long have you been experiencing CP? Started this morning about 5am. He thought is was indigestion.  4. Is your CP continuous or coming and going? Continuous   5. Have you taken Nitroglycerin? No  ?

## 2017-03-16 ENCOUNTER — Inpatient Hospital Stay (HOSPITAL_COMMUNITY): Payer: 59

## 2017-03-16 ENCOUNTER — Other Ambulatory Visit: Payer: Self-pay | Admitting: Physician Assistant

## 2017-03-16 ENCOUNTER — Encounter (HOSPITAL_COMMUNITY): Payer: Self-pay | Admitting: Cardiovascular Disease

## 2017-03-16 DIAGNOSIS — R748 Abnormal levels of other serum enzymes: Secondary | ICD-10-CM

## 2017-03-16 DIAGNOSIS — R072 Precordial pain: Secondary | ICD-10-CM | POA: Diagnosis not present

## 2017-03-16 DIAGNOSIS — R079 Chest pain, unspecified: Secondary | ICD-10-CM

## 2017-03-16 DIAGNOSIS — R0789 Other chest pain: Secondary | ICD-10-CM | POA: Diagnosis not present

## 2017-03-16 DIAGNOSIS — E782 Mixed hyperlipidemia: Secondary | ICD-10-CM | POA: Diagnosis not present

## 2017-03-16 DIAGNOSIS — I214 Non-ST elevation (NSTEMI) myocardial infarction: Secondary | ICD-10-CM | POA: Diagnosis not present

## 2017-03-16 DIAGNOSIS — R7989 Other specified abnormal findings of blood chemistry: Secondary | ICD-10-CM | POA: Diagnosis not present

## 2017-03-16 DIAGNOSIS — R778 Other specified abnormalities of plasma proteins: Secondary | ICD-10-CM

## 2017-03-16 LAB — CBC
HEMATOCRIT: 40.5 % (ref 39.0–52.0)
HEMOGLOBIN: 13.8 g/dL (ref 13.0–17.0)
MCH: 30.3 pg (ref 26.0–34.0)
MCHC: 34.1 g/dL (ref 30.0–36.0)
MCV: 88.8 fL (ref 78.0–100.0)
Platelets: 221 10*3/uL (ref 150–400)
RBC: 4.56 MIL/uL (ref 4.22–5.81)
RDW: 13.4 % (ref 11.5–15.5)
WBC: 8.2 10*3/uL (ref 4.0–10.5)

## 2017-03-16 LAB — LIPID PANEL
CHOLESTEROL: 186 mg/dL (ref 0–200)
HDL: 36 mg/dL — AB (ref >40)
LDL CALC: 103 mg/dL — AB (ref 0–99)
TRIGLYCERIDES: 235 mg/dL — AB (ref <150)
Total CHOL/HDL Ratio: 5.2 RATIO
VLDL: 47 mg/dL — ABNORMAL HIGH (ref 0–40)

## 2017-03-16 LAB — RAPID URINE DRUG SCREEN, HOSP PERFORMED
Amphetamines: NOT DETECTED
BENZODIAZEPINES: POSITIVE — AB
Barbiturates: NOT DETECTED
Cocaine: NOT DETECTED
Opiates: NOT DETECTED
Tetrahydrocannabinol: NOT DETECTED

## 2017-03-16 LAB — ECHOCARDIOGRAM COMPLETE
Height: 67.5 in
Weight: 6843.08 oz

## 2017-03-16 LAB — HIV ANTIBODY (ROUTINE TESTING W REFLEX): HIV SCREEN 4TH GENERATION: NONREACTIVE

## 2017-03-16 LAB — TROPONIN I: Troponin I: 5.77 ng/mL (ref <0.03)

## 2017-03-16 MED ORDER — LOSARTAN POTASSIUM 50 MG PO TABS: 50.0000 mg | ORAL_TABLET | Freq: Every day | ORAL | 2 refills | Status: DC

## 2017-03-16 MED ORDER — IOPAMIDOL (ISOVUE-370) INJECTION 76%
INTRAVENOUS | Status: AC
  Administered 2017-03-16: 100 mL
  Filled 2017-03-16: qty 100

## 2017-03-16 MED ORDER — LOSARTAN POTASSIUM 50 MG PO TABS: 50.0000 mg | ORAL_TABLET | Freq: Every day | ORAL | Status: DC

## 2017-03-16 MED ORDER — HYDROCHLOROTHIAZIDE 12.5 MG PO CAPS: 25.0000 mg | ORAL_CAPSULE | Freq: Every day | ORAL | Status: DC

## 2017-03-16 MED ORDER — HYDROCHLOROTHIAZIDE 25 MG PO TABS: 25.0000 mg | ORAL_TABLET | Freq: Every day | ORAL | 2 refills | Status: DC

## 2017-03-16 MED ORDER — ATORVASTATIN CALCIUM 80 MG PO TABS: 80.0000 mg | ORAL_TABLET | Freq: Every day | ORAL | 12 refills | Status: DC

## 2017-03-16 MED ORDER — LOSARTAN POTASSIUM 50 MG PO TABS
25.0000 mg | ORAL_TABLET | Freq: Once | ORAL | Status: AC
Start: 2017-03-16 — End: 2017-03-16
  Administered 2017-03-16: 25 mg via ORAL
  Filled 2017-03-16: qty 1

## 2017-03-16 MED ORDER — PERFLUTREN LIPID MICROSPHERE
1.0000 mL | INTRAVENOUS | Status: AC | PRN
Start: 2017-03-16 — End: 2017-03-16
  Administered 2017-03-16: 10:00:00 3 mL via INTRAVENOUS

## 2017-03-16 MED ORDER — NITROGLYCERIN 0.4 MG SL SUBL: 0.4000 mg | SUBLINGUAL_TABLET | SUBLINGUAL | 3 refills | Status: DC | PRN

## 2017-03-16 NOTE — Progress Notes (Signed)
Progress Note  Patient Name: Jacob Hunt Date of Encounter: 03/16/2017  Primary Cardiologist: Dr. Delton See (new, prev Dr. Patty Sermons)  Subjective   No CP. Feeling back to baseline. Ambulated without complication.  Inpatient Medications    Scheduled Meds: . aspirin EC  81 mg Oral Daily  . atorvastatin  80 mg Oral q1800  . losartan  25 mg Oral Daily  . sodium chloride flush  3 mL Intravenous Q12H   Continuous Infusions: . sodium chloride     PRN Meds: sodium chloride, acetaminophen, nitroGLYCERIN, ondansetron (ZOFRAN) IV, perflutren lipid microspheres (DEFINITY) IV suspension, sodium chloride flush   Vital Signs    Vitals:   03/16/17 0010 03/16/17 0600 03/16/17 0700 03/16/17 0805  BP: 122/74 121/72 (!) 155/86 (!) 155/86  Pulse: 68 67 68   Resp: 20  (!) 21 (!) 25  Temp: 98.7 F (37.1 C) 98.2 F (36.8 C) 97.7 F (36.5 C)   TempSrc: Oral Oral Oral   SpO2: 97% 97% 97%   Weight:  (!) 427 lb 11.1 oz (194 kg)    Height:        Intake/Output Summary (Last 24 hours) at 03/16/17 1058 Last data filed at 03/16/17 0700  Gross per 24 hour  Intake              290 ml  Output                0 ml  Net              290 ml   Filed Weights   03/15/17 1300 03/16/17 0600  Weight: (!) 428 lb 5.7 oz (194.3 kg) (!) 427 lb 11.1 oz (194 kg)    Telemetry    NSR, brief SB 56bpm but no significant pauses or bradycardia - Personally Reviewed  Physical Exam   GEN: No acute distress. Morbidly obese HEENT: Normocephalic, atraumatic, sclera non-icteric. Neck: No JVD or bruits. Cardiac: RRR no murmurs, rubs, or gallops.  Radials/DP/PT 1+ and equal bilaterally.  Respiratory: Diminished throughout likely due to habitus but otherwise clear to auscultation bilaterally. Breathing is unlabored. GI: Soft, nontender, non-distended, BS +x 4. MS: no deformity. Extremities: No clubbing or cyanosis. Extremely large baseline leg habitus, equal bilaterally, indentation of sock line, suspect  lipedema. Distal pedal pulses are 2+ and equal bilaterally. Right radial cath site without hematoma or ecchymosis; good pulse. Neuro:  AAOx3. Follows commands. Psych:  Responds to questions appropriately with a normal affect.  Labs    Chemistry Recent Labs Lab 03/15/17 1139  NA 136  K 4.7  CL 104  CO2 24  GLUCOSE 118*  BUN 10  CREATININE 0.82  CALCIUM 9.5  GFRNONAA >60  GFRAA >60  ANIONGAP 8     Hematology Recent Labs Lab 03/15/17 1139 03/16/17 0132  WBC 7.3 8.2  RBC 4.90 4.56  HGB 14.6 13.8  HCT 43.5 40.5  MCV 88.8 88.8  MCH 29.8 30.3  MCHC 33.6 34.1  RDW 12.9 13.4  PLT 239 221    Cardiac Enzymes Recent Labs Lab 03/15/17 1834 03/16/17 0132  TROPONINI 6.29* 5.77*    Recent Labs Lab 03/15/17 1148  TROPIPOC 0.66*   Radiology    Dg Chest 2 View  Result Date: 03/15/2017 CLINICAL DATA:  46 year old male with a history of chest pain EXAM: CHEST  2 VIEW COMPARISON:  None. FINDINGS: The heart size and mediastinal contours are within normal limits. Both lungs are clear. The visualized skeletal structures are unremarkable. IMPRESSION: No  radiographic evidence of acute cardiopulmonary disease Electronically Signed   By: Gilmer MorJaime  Wagner D.O.   On: 03/15/2017 12:01    Cardiac Studies   Cath 03/15/17  Procedures  LEFT HEART CATH AND CORONARY ANGIOGRAPHY  Conclusion   1. Widely patent coronary arteries with mild ectasia of the proximal LAD and minimal nonobstructive disease involving the proximal RCA, unchanged from 2017 cath 2. Normal LV systolic function  Suspect noncardiac symptoms     Patient Profile     46 y.o. male with normal coronaries 07/2015, super morbid obesity (Body mass index is 66 kg/m.), HLD, family history of premature CAD who [facility] which woke him out of sleep. R/i with +Troponins up to 6 overnight.  Assessment & Plan    1. Elevated troponin - etiology not totally clear, peaked at 6 overnight. Cath unrevealing for cause.  Remaining differential includes untreated OSA leading to hypoxia/demand ischemia, coronary vasospasm or possibly transient tachyarrhythmia not perceived by patient. Discussed PE w/u with Dr. Delton SeeNelson who recommends to hold off. Await her recs regarding further workup. 2D echo pending. UDS ordered.  2. Morbid obesity - discussed importance of long-term weight loss. Wife has OSA. She has not noticed patient snoring or stopping breathing but also wears mask herself so may not hear these events. Recommend OP sleep study. Will review with Dr. Delton SeeNelson whether we need to consider inpatient overnight oximetry prior to DC.  3. HTN - losartan started yesterday. Will discuss potential addition of beta blocker.  4. Hyperlipidemia - patient not taking atorvastatin as OP. Resumed this admission.  Signed, Ronie Spiesayna Dunn PA-C (pager [phone]) 03/16/2017, 10:58 AM    The patient was seen, examined and discussed with Ronie Spiesayna Dunn, PA-C and I agree with the above.   46 y.o.malewith a hx of normal coronary arteries by cath 07/2015, morbid obesity, severe family h/o premature CAD with MI in his father, mother, brother at age 44, and HLDwho is being seen today for the evaluation of chest pain and elevated troponinof 0.66 ->6.3->5.7. His ECG is normal. Cardiac cath showed normal coronaries, normal LVEF. He feels better today, no recurrent chest pain or SOB. We will order chest CTA to rule out pulmonary embolism, if normal discharge home.  He is morbidly obese and not active. The mainstay of therapy would also be weight loss. Dietitian classes. I will further increase losartan to 50 mg po daily and add HCTZ 25 mg po daily for hypertension.   Tobias AlexanderKatarina Marchetta Navratil, MD 03/16/2017

## 2017-03-16 NOTE — Care Management Note (Signed)
Case Management Note  Patient Details  Name: Gale Journeyhomas S Alpern MRN: [id number] Date of Birth: 1970-12-25  Subjective/Objective:    From home, s/p left heart cath and coronary angiography.                Action/Plan: NCM will follow for dc needs.   Expected Discharge Date:                  Expected Discharge Plan:  Home/Self Care  In-House Referral:     Discharge planning Services  CM Consult  Post Acute Care Choice:    Choice offered to:     DME Arranged:    DME Agency:     HH Arranged:    HH Agency:     Status of Service:  Completed, signed off  If discussed at [employer] Length of Stay Meetings, dates discussed:    Additional Comments:  Leone Havenaylor, Renalda Locklin Clinton, RN 03/16/2017, 9:16 AM

## 2017-03-16 NOTE — Progress Notes (Signed)
Spoke w/ CM earlier regarding pt status as they will have to have physician advisors review appropriateness of status. Admitting team placed as inpatient, pt with troponin of 6 but not clearly an ACS-mediated STEMI. She reported since patient is [employer] they have opportunity to further review on back end. Dayna Dunn PA-C

## 2017-03-16 NOTE — Progress Notes (Signed)
  Echocardiogram 2D Echocardiogram with definity has been performed.  Leta JunglingCooper, Colbi Staubs M 03/16/2017, 10:03 AM

## 2017-03-16 NOTE — Discharge Summary (Signed)
Discharge Summary    Patient ID: Jacob Hunt,  MRN: [id number], DOB/AGE: 44-Nov-1972 44 y.o.  Admit date: 03/15/2017 Discharge date: 03/16/2017  Primary Care Provider: Clovis Riley, L.Dean Primary Cardiologist: Previously Dr. Patty Sermons, now Dr. Delton See  Discharge Diagnoses    Principal Problem:   Elevated troponin Active Problems:   Morbid obesity (HCC)   HLD (hyperlipidemia)   Chest pain, unspecified   Diagnostic Studies/Procedures    Procedures LEFT HEART CATH AND CORONARY ANGIOGRAPHY  Conclusion   1. Widely patent coronary arteries with mild ectasia of the proximal LAD and minimal nonobstructive disease involving the proximal RCA, unchanged from 2017 cath 2. Normal LV systolic function Suspect noncardiac symptoms   2d echo 03/16/17 Study Conclusions  - Left ventricle: The cavity size was normal. There was mild focal   basal hypertrophy of the septum. Systolic function was normal.   The estimated ejection fraction was in the range of 55% to 60%.   Wall motion was normal; there were no regional wall motion   abnormalities. Doppler parameters are consistent with abnormal   left ventricular relaxation (grade 1 diastolic dysfunction).  Impressions:  - Technically diffiicult; definity used; normal LV systolic   function; mild diastolic dysfunction.   _____________     History of Present Illness     Jacob Hunt is a 46 y.o. male with history of normal coronaries 07/2015, super morbid obesity (Body mass index is 66 kg/m.), HLD, family history of premature CAD who presented to [facility] with CP which woke him out of sleep.   Hospital Course    It started ~5 AM on morning of admission, substernal, felt like a "gas bubble." He had had frequent belching which led to mild relief but no complete resolution. He has also tried TUMS without relief.  Discomfort was worse in supine position and relieved sitting upright. He denies any associated dyspnea, diaphoresis, dizziness,  syncope/ near syncope. EKG showedNSR w/ low voltage QRS complexes, likely secondary to body habitus/obesity. Initial POC troponin abnormal at 0.66. CBC and BMP unremarkable. CXR w/o radiographic evidence of acute cardiopulmonary disease. He was started on IV heparin and admitted for further evaluation. He underwent cardiac cath same day as admission demonstrating widely patent coronary arteries with mild ectasia of the proximal LAD and minimal nonobstructive disease involving the proximal RCA, unchanged from 2017 cath, normal LV function. Overnight his troponins rose to 6.29 then down to 5.77. Etiology of troponins not totally clear. CTA was negative for PE. Dr.Nelson recommended medical therapy with BP control and statin. Losartan and HCTZ were added. Will need BMET at f/u visit. LDL was 103 with trig 235; patient not taking statin prior to admission - atorvastatin was resumed. If the patient is tolerating statin at time of follow-up appointment, would consider rechecking liver function/lipid panel in 6-8 weeks. There was question raised of whether his severe morbid obesity could have contributed to OSA, possibly causing a demand ischemia picture. Dr. Excell Seltzer also suggested that the possibility of an underlying tachy-arrhythmia could have precipitated events. Will plan 30 day event monitor. Sent message to schedulers at office to help arrange sleep study and 30 day event monitor. They will call him with this information. TOC f/u arranged. Dr. Delton See has seen and examined the patient today and feels he is stable for discharge. He has ambulated without any recurrent symptoms and felt fine today. _____________  Discharge Vitals Blood pressure (!) 152/89, pulse 68, temperature 97.8 F (36.6 C), temperature source Oral, resp. rate 17, height  5' 7.5" (1.715 m), weight (!) 427 lb 11.1 oz (194 kg), SpO2 94 %.  Filed Weights   03/15/17 1300 03/16/17 0600  Weight: (!) 428 lb 5.7 oz (194.3 kg) (!) 427 lb 11.1 oz (194  kg)    Labs & Radiologic Studies    CBC  Recent Labs  03/15/17 1139 03/16/17 0132  WBC 7.3 8.2  HGB 14.6 13.8  HCT 43.5 40.5  MCV 88.8 88.8  PLT 239 221   Basic Metabolic Panel  Recent Labs  03/15/17 1139  NA 136  K 4.7  CL 104  CO2 24  GLUCOSE 118*  BUN 10  CREATININE 0.82  CALCIUM 9.5   Cardiac Enzymes  Recent Labs  03/15/17 1834 03/16/17 0132  TROPONINI 6.29* 5.77*   Fasting Lipid Panel  Recent Labs  03/16/17 0132  CHOL 186  HDL 36*  LDLCALC 103*  TRIG 235*  CHOLHDL 5.2   _____________  Dg Chest 2 View  Result Date: 03/15/2017 CLINICAL DATA:  46 year old male with a history of chest pain EXAM: CHEST  2 VIEW COMPARISON:  None. FINDINGS: The heart size and mediastinal contours are within normal limits. Both lungs are clear. The visualized skeletal structures are unremarkable. IMPRESSION: No radiographic evidence of acute cardiopulmonary disease Electronically Signed   By: Gilmer MorJaime  Wagner D.O.   On: 03/15/2017 12:01   Ct Angio Chest Pe W Or Wo Contrast  Result Date: 03/16/2017 CLINICAL DATA:  Centralized chest pain began yesterday. No shortness of breath. EXAM: CT ANGIOGRAPHY CHEST WITH CONTRAST TECHNIQUE: Multidetector CT imaging of the chest was performed using the standard protocol during bolus administration of intravenous contrast. Multiplanar CT image reconstructions and MIPs were obtained to evaluate the vascular anatomy. CONTRAST:  83 mL Isovue 370 COMPARISON:  None. FINDINGS: Cardiovascular: Satisfactory opacification of the pulmonary arteries to the segmental level. No evidence of pulmonary embolism. Normal heart size. No pericardial effusion. Mediastinum/Nodes: No enlarged mediastinal, hilar, or axillary lymph nodes. Thyroid gland, trachea, and esophagus demonstrate no significant findings. Lungs/Pleura: Lungs are clear. No pleural effusion or pneumothorax. Upper Abdomen: No acute abnormality. Diffuse low attenuation of the liver as can be seen with  hepatic steatosis. Musculoskeletal: No chest wall abnormality. No acute or significant osseous findings. Review of the MIP images confirms the above findings. IMPRESSION: 1. No evidence pulmonary embolus. 2. No active cardiopulmonary disease. Electronically Signed   By: Elige KoHetal  Patel   On: 03/16/2017 16:02   Disposition   Pt is being discharged home today in good condition.  Follow-up Plans & Appointments    Follow-up Information    Lars MassonNelson, Katarina H, MD Follow up.   Specialty:  Cardiology Why:  [facility] - office will call you to schedule your sleep study and set up a 30-day heart monitor.  Contact information: [address] [place] [address] [phone]        Dyann KiefLenze, Michele M, PA-C Follow up.   Specialty:  Cardiology Why:  [facility] - 03/29/17 at 12:15pm. Contact information: [address] [place] [address] [phone]          Discharge Instructions    Diet - low sodium heart healthy    Complete by:  As directed    Increase activity slowly    Complete by:  As directed    No driving for 1 week. No lifting over 10 lbs for 2 weeks. No sexual activity for 2 weeks. You will be seen back in the office before  clearing to return to work. Keep procedure site clean & dry. If you notice increased pain, swelling, bleeding or pus, call/return!  You may shower, but no soaking baths/hot tubs/pools for 1 week.   The provoking event for your elevated troponin was not clear, but your weight puts you at risk for significant sleep apnea which can sometimes cause decreased oxygen levels at night. You will receive a call about setting up sleep study as outpatient. We would also like for you to wear a 30-day event monitor to exclude rhythm abnormalities causing this elevation. If symptoms recur you should return to the emergency room.  Ibuprofen was stopped due to increased cardiovascular risk.  Please monitor your blood pressure occasionally  at home. Call your doctor if you tend to get readings of greater than 130 on the top number or 80 on the bottom number.      Discharge Medications   Allergies as of 03/16/2017   No Known Allergies     Medication List    STOP taking these medications   ibuprofen 200 MG tablet Commonly known as:  ADVIL,MOTRIN     TAKE these medications   aspirin EC 81 MG tablet Take 1 tablet (81 mg total) by mouth daily.   atorvastatin 40 MG tablet Commonly known as:  LIPITOR Take 1 tablet (40 mg total) by mouth daily.   hydrochlorothiazide 25 MG tablet Commonly known as:  HYDRODIURIL Take 1 tablet (25 mg total) by mouth daily.   losartan 50 MG tablet Commonly known as:  COZAAR Take 1 tablet (50 mg total) by mouth daily.   nitroGLYCERIN 0.4 MG SL tablet Commonly known as:  NITROSTAT Place 1 tablet (0.4 mg total) under the tongue every 5 (five) minutes as needed for chest pain.            Discharge Care Instructions        Start     Ordered   03/17/17 0000  losartan (COZAAR) 50 MG tablet  Daily    Question:  Supervising Provider  Answer:  Dolores Patty   03/16/17 1638   03/16/17 0000  hydrochlorothiazide (HYDRODIURIL) 25 MG tablet  Daily    Question:  Supervising Provider  Answer:  Dolores Patty   03/16/17 1638   03/16/17 0000  Diet - low sodium heart healthy     03/16/17 1638   03/16/17 0000  Increase activity slowly     03/16/17 1645       Allergies:  No Known Allergies   Outstanding Labs/Studies   N/A  Duration of Discharge Encounter   Greater than 30 minutes including physician time.  Signed, Laurann Montana PA-C 03/16/2017, 4:56 PM

## 2017-03-17 ENCOUNTER — Telehealth: Payer: Self-pay | Admitting: *Deleted

## 2017-03-17 NOTE — Telephone Encounter (Signed)
[facility].  Patient understands to follow up with provider Stacie GlazeYes--[name], PA  on 03/23/17 at [address]  Patient understands discharge instructions? Yes  Patient understands medications and regiment?  Yes  Patient understands to bring all medications to this visit? yes

## 2017-03-19 ENCOUNTER — Telehealth: Payer: Self-pay | Admitting: *Deleted

## 2017-03-19 NOTE — Telephone Encounter (Signed)
-----   Message from Gerome Sam sent at 03/17/2017  8:10 AM EDT ----- Regarding: 03/17/17  sleep study.   Thanks   ----- Message ----- From: Laurann Montana, PA-C Sent: 03/16/2017   4:49 PM To: [facility], [facility] Scheduling Subject: Need some items for follow-up.                 Hey scheduling and [facility], I am discharging this patient from the hospital today. Needs a sleep study for morbid obesity and elevated troponin. Also needs a 30 day monitor for elevated troponin. Now that Lamar Laundry is gone I wasn't sure who to send to. Will also need TOC phone call. Can you help with these requests and call pt with appt info? Thanks. Dayna Dunn PA-C

## 2017-03-19 NOTE — Telephone Encounter (Signed)
Informed patient of upcoming sleep study and patient understanding was verbalized. Patient understands his sleep study is scheduled for Tuesday April 20 2017. Patient understands his sleep study will be done at [facility] sleep lab. Patient understands he will receive a sleep packet in a week or so. Patient understands to call if he does not receive the sleep packet in a timely manner. Patient agrees with treatment and thanked me for call.

## 2017-03-23 ENCOUNTER — Ambulatory Visit (INDEPENDENT_AMBULATORY_CARE_PROVIDER_SITE_OTHER): Payer: 59 | Admitting: Physician Assistant

## 2017-03-23 ENCOUNTER — Encounter: Payer: Self-pay | Admitting: Physician Assistant

## 2017-03-23 VITALS — BP 118/80 | HR 82 | Ht 67.0 in | Wt >= 6400 oz

## 2017-03-23 DIAGNOSIS — R7989 Other specified abnormal findings of blood chemistry: Principal | ICD-10-CM

## 2017-03-23 DIAGNOSIS — R778 Other specified abnormalities of plasma proteins: Secondary | ICD-10-CM

## 2017-03-23 DIAGNOSIS — I10 Essential (primary) hypertension: Secondary | ICD-10-CM | POA: Diagnosis not present

## 2017-03-23 DIAGNOSIS — E782 Mixed hyperlipidemia: Secondary | ICD-10-CM

## 2017-03-23 DIAGNOSIS — R748 Abnormal levels of other serum enzymes: Secondary | ICD-10-CM

## 2017-03-23 NOTE — Progress Notes (Signed)
Cardiology Office Note    Date:  03/23/2017   ID:  Jacob Hunt, DOB 05-27-1971, MRN [id number]  PCP:  Clovis Riley, Elbert Ewings.Jacob Saucer, MD  Cardiologist: Dr. Delton See  No chief complaint on file.   History of Present Illness:  Jacob Hunt is a 46 y.o. male with history of normal coronary arteries 07/2015, morbid obesity BMI 66, HLD, family history of premature CAD. He was admitted to [facility] with chest pain that woke him from sleep. Troponins rose to 6.29. He underwent cardiac catheterization demonstrated widely patent coronary arteries with mild ectasia of the proximal LAD and minimal nonobstructive disease involving the proximal RCA unchanged from 2017. Had normal LV function. Etiology of troponins not totally clear. CTA was negative for PE. Medical therapy for blood pressure control and statins was recommended. Losartan HCTZ were added. Needs follow-up bmet. Patient is scheduled for sleep apnea test as well as a 30 day monitor to rule out any underlying tachyarrhythmia that could have precipitated this event.  Patient comes in accompanied by his wife and mother. He's had some dizziness after he takes his medications and leg cramps. They have been checking his blood pressures but have a regular sized cuff and need to get a large cuff as it doesn't coincides with our blood pressure readings. He like to return to work next week. 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 Leg swelling has improved and he lost 24 pounds the hospital. Had some minor chest pains but they're nonspecific and short lived. He also complains of cough wondering if some of this is, from his medications. He didn't take his medications this morning and he feels good without any symptoms.   Past Medical History:  Diagnosis Date  . Essential hypertension   . GERD (gastroesophageal reflux disease)   . Lumbar herniated disc   . Morbid obesity (HCC)     Past Surgical History:  Procedure Laterality Date  . CARDIAC CATHETERIZATION N/A 08/02/2015   Procedure: Left Heart Cath and Coronary Angiography;  Surgeon: Peter M Swaziland, MD;  Location: [facility] INVASIVE CV LAB;  Service: Cardiovascular;  Laterality: N/A;  . CARDIAC CATHETERIZATION  03/15/2017  . LEFT HEART CATH AND CORONARY ANGIOGRAPHY N/A 03/15/2017   Procedure: LEFT HEART CATH AND CORONARY ANGIOGRAPHY;  Surgeon: Tonny Bollman, MD;  Location: [facility] INVASIVE CV LAB;  Service: Cardiovascular;  Laterality: N/A;    Current Medications: Current Meds  Medication Sig  . aspirin EC 81 MG tablet Take 1 tablet (81 mg total) by mouth daily.  Marland Kitchen atorvastatin (LIPITOR) 80 MG tablet Take 1 tablet (80 mg total) by mouth daily at 6 PM.  . hydrochlorothiazide (HYDRODIURIL) 25 MG tablet Take 1 tablet (25 mg total) by mouth daily.  Marland Kitchen losartan (COZAAR) 50 MG tablet Take 1 tablet (50 mg total) by mouth daily.  . nitroGLYCERIN (NITROSTAT) 0.4 MG SL tablet Place 1 tablet (0.4 mg total) under the tongue every 5 (five) minutes x 3 doses as needed for chest pain.     Allergies:   Patient has no known allergies.   Social History   Social History  . Marital status: Married    Spouse name: N/A  . Number of children: N/A  . Years of education: N/A   Social History Main Topics  . Smoking status: Never Smoker  . Smokeless tobacco: Never Used  . Alcohol use No  . Drug use: No  . Sexual activity: Yes   Other Topics Concern  . None   Social History Narrative  .  None     Family History:  The patient's   family history includes CAD in his brother, father, and mother; Cancer in his maternal aunt, maternal grandfather, and mother; Coronary artery disease in his maternal grandfather; Diabetes in his brother, father, and mother; Hypertension in his brother, father, and mother; Other in his other.   ROS:   Please see the history of present illness.    Review of Systems  Constitution: Positive for weakness.  HENT: Negative.   Cardiovascular: Positive for chest pain and leg swelling.  Respiratory: Positive  for cough.   Endocrine: Negative.   Hematologic/Lymphatic: Negative.   Musculoskeletal: Negative.   Gastrointestinal: Negative.   Genitourinary: Negative.    All other systems reviewed and are negative.   PHYSICAL EXAM:   VS:  BP 118/80   Pulse 82   Ht  (1.702 m)   Wt (!) 403 lb (182.8 kg)   BMI 63.12 kg/m   Physical Exam  GEN: Well nourished, well developed, in no acute distress  HEENT: normal  Neck: no JVD, carotid bruits, or masses Cardiac:RRR; no murmurs, rubs, or gallops  Respiratory:  clear to auscultation bilaterally, normal work of breathing GI: soft, nontender, nondistended, + BS Ext: without cyanosis, clubbing, or edema, Good distal pulses bilaterally MS: no deformity or atrophy  Skin: warm and dry, no rash Neuro:  Alert and Oriented x 3, Strength and sensation are intact Psych: euthymic mood, full affect  Wt Readings from Last 3 Encounters:  03/23/17 (!) 403 lb (182.8 kg)  03/16/17 (!) 427 lb 11.1 oz (194 kg)  08/02/15 (!) 419 lb (190.1 kg)      Studies/Labs Reviewed:   EKG:  EKG is not ordered today.    Recent Labs: 03/15/2017: BUN 10; Creatinine, Ser 0.82; Potassium 4.7; Sodium 136 03/16/2017: Hemoglobin 13.8; Platelets 221   Lipid Panel    Component Value Date/Time   CHOL 186 03/16/2017 0132   TRIG 235 (H) 03/16/2017 0132   HDL 36 (L) 03/16/2017 0132   CHOLHDL 5.2 03/16/2017 0132   VLDL 47 (H) 03/16/2017 0132   LDLCALC 103 (H) 03/16/2017 0132    Additional studies/ records that were reviewed today include:  2Decho 03/16/17 Study Conclusions   - Left ventricle: The cavity size was normal. There was mild focal   basal hypertrophy of the septum. Systolic function was normal.   The estimated ejection fraction was in the range of 55% to 60%.   Wall motion was normal; there were no regional wall motion   abnormalities. Doppler parameters are consistent with abnormal   left ventricular relaxation (grade 1 diastolic dysfunction).     Impressions:   - Technically diffiicult; definity used; normal LV systolic   function; mild diastolic dysfunction  CTA 03/16/17 IMPRESSION: 1. No evidence pulmonary embolus. 2. No active cardiopulmonary disease.     Electronically Signed   By: Elige Ko   On: 03/16/2017 16:02    Cardiac Cath 03/15/17 Conclusion  1. Widely patent coronary arteries with mild ectasia of the proximal LAD and minimal nonobstructive disease involving the proximal RCA, unchanged from 2017 cath 2. Normal LV systolic function   Suspect noncardiac symptoms      ASSESSMENT:    1. Elevated troponin   2. Morbid obesity (HCC)   3. Essential hypertension   4. Mixed hyperlipidemia      PLAN:  In order of problems listed above:  Elevated troponins with chest pain unclear etiology, cardiac catheterization with normal coronary arteries and  unchanged from cath 2017. Scheduled for sleep apnea test and 30 day monitor to rule out any underlying tachyarrhythmias that caused the elevated troponins of 6. Follow-up with Dr. Delton See next available.  Morbid obesity weight loss essential. Discussed joining weight watchers which he did many years ago.  Essential hypertension blood pressure is good today and he hasn't taken any medications. I wonder if his blood pressures are actually getting low. Bascom to take his HCTZ in the morning and losartan in the evening. They were are going to order a large cuff for his blood pressure machine to keep closer track. The meantime he will have it checked at the local pharmacy.  Mixed hyperlipidemia on increased Lipitor. Check fasting lipid panels and LFTs in 6 weeks.    Medication Adjustments/Labs and Tests Ordered: Current medicines are reviewed at length with the patient today.  Concerns regarding medicines are outlined above.  Medication changes, Labs and Tests ordered today are listed in the Patient Instructions below. There are no Patient Instructions on file for this  visit.   Elson Clan, PA-C  03/23/2017 12:03 PM    [facility] [address] [place], [place], [place]  [address] Phone: [phone]; Fax: [phone]

## 2017-03-23 NOTE — Addendum Note (Signed)
Addended by: Dyann Kief on: 03/23/2017 02:08 PM   Modules accepted: Level of Service

## 2017-03-23 NOTE — Patient Instructions (Signed)
Medication Instructions:  Your physician has recommended you make the following change in your medication:  1. Take HCTZ in the am. 2. Take Losartan in the pm.   Labwork: Your physician recommends that you have  lab work in: today BMET Your physician recommends that you return for a FASTING lipid profile: on October 29.   Testing/Procedures: none  Follow-Up: Your physician recommends that you keep your scheduled follow-up appointment with Dr. Delton See.   Any Other Special Instructions Will Be Listed Below (If Applicable). Jacob Hunt would like patient to join Weight Watchers. Patient had success in the past.  Patient is to go to the local [facility] to get blood pressure checked and get a large cuff.  Patient was given a note today to return to work.   If you need a refill on your cardiac medications before your next appointment, please call your pharmacy.

## 2017-03-24 LAB — BASIC METABOLIC PANEL
BUN / CREAT RATIO: 18 (ref 9–20)
BUN: 17 mg/dL (ref 6–24)
CO2: 18 mmol/L — ABNORMAL LOW (ref 20–29)
CREATININE: 0.94 mg/dL (ref 0.76–1.27)
Calcium: 10 mg/dL (ref 8.7–10.2)
Chloride: 95 mmol/L — ABNORMAL LOW (ref 96–106)
GFR calc Af Amer: 114 mL/min/{1.73_m2} (ref >59)
GFR, EST NON AFRICAN AMERICAN: 99 mL/min/{1.73_m2} (ref >59)
GLUCOSE: 104 mg/dL — AB (ref 65–99)
Potassium: 4.7 mmol/L (ref 3.5–5.2)
SODIUM: 134 mmol/L (ref 134–144)

## 2017-03-26 ENCOUNTER — Telehealth: Payer: Self-pay | Admitting: Cardiology

## 2017-03-26 NOTE — Telephone Encounter (Signed)
[name](Wife) is calling to find out if Mr. Petrosyan can take Tyenol with his medication ( Atorvastatin , losartan,HCTZ and a low dose Aspirin ) Please call

## 2017-03-26 NOTE — Telephone Encounter (Signed)
Left a detailed message on the confirmed VM of the pt and wife's number that Tylenol is safe with his health history and cardiac meds taking, and NSAIDS should be avoided. Left a detailed message for both parties to call back with any additional questions regarding this issue.

## 2017-03-29 ENCOUNTER — Ambulatory Visit: Payer: 59 | Admitting: Physician Assistant

## 2017-03-30 ENCOUNTER — Other Ambulatory Visit: Payer: Self-pay | Admitting: Physician Assistant

## 2017-03-30 DIAGNOSIS — R778 Other specified abnormalities of plasma proteins: Secondary | ICD-10-CM

## 2017-03-30 DIAGNOSIS — R000 Tachycardia, unspecified: Secondary | ICD-10-CM

## 2017-03-30 DIAGNOSIS — R079 Chest pain, unspecified: Secondary | ICD-10-CM

## 2017-03-30 DIAGNOSIS — R7989 Other specified abnormal findings of blood chemistry: Secondary | ICD-10-CM

## 2017-03-31 ENCOUNTER — Ambulatory Visit (INDEPENDENT_AMBULATORY_CARE_PROVIDER_SITE_OTHER): Payer: 59

## 2017-03-31 DIAGNOSIS — R000 Tachycardia, unspecified: Secondary | ICD-10-CM | POA: Diagnosis not present

## 2017-03-31 DIAGNOSIS — R778 Other specified abnormalities of plasma proteins: Secondary | ICD-10-CM

## 2017-03-31 DIAGNOSIS — R748 Abnormal levels of other serum enzymes: Secondary | ICD-10-CM

## 2017-03-31 DIAGNOSIS — R7989 Other specified abnormal findings of blood chemistry: Secondary | ICD-10-CM

## 2017-03-31 DIAGNOSIS — R079 Chest pain, unspecified: Secondary | ICD-10-CM

## 2017-04-01 ENCOUNTER — Telehealth: Payer: Self-pay | Admitting: Physician Assistant

## 2017-04-01 NOTE — Telephone Encounter (Signed)
New message  Pt wife call requesting to speak with someone about pts monitor. She would like to ask a few questions. Please call back to discuss

## 2017-04-02 DIAGNOSIS — R000 Tachycardia, unspecified: Secondary | ICD-10-CM | POA: Diagnosis not present

## 2017-04-02 NOTE — Telephone Encounter (Signed)
Follow up   Pt wife is calling back to speak to rn about pt monitor    1. Is this related to a heart monitor you are wearing? Yes pt wife is calling  (If the patient says no, please ask     if they are caling about ICD/pacemaker.)   2. What is your issue?? Pt wife is calling because she has some questions that she want to to ask    (If the patient is calling for results of the heart monitor this     message should be sent to nurse.)   Please route to covering RN/CMA/RMA for results. Route to monitor technicians or your monitor tech representative for your site for any technical concerns

## 2017-04-02 NOTE — Telephone Encounter (Signed)
Wife not on DPR. But spoke to the patient and answered all there questions. Also advised he should fill out a DPR at next appt.

## 2017-04-14 NOTE — Telephone Encounter (Addendum)
Patient's wife Jacob Hunt) called today on behalf of the patient to cancel his in lab study and order a home sleep study. Home sleep study will be ordered through [facility] today. Patient and wife notified and given the [phone] number to reach [facility] for questions.  Informed patient of upcoming Home sleep study and patient understanding was verbalized. Patient understands his sleep study will be done at Home. Patient understands he will receive a sleep call in a week or so. Patient understands to call if he does not receive the sleep packet in a timely manner. Patient agrees with treatment and thanked me for call.

## 2017-04-14 NOTE — Telephone Encounter (Signed)
In lab denied  Deidre Ala, CMA        [place], in lab study was denied. But I see this has already been canceled.  Thanks,  Amy

## 2017-04-20 ENCOUNTER — Encounter (HOSPITAL_BASED_OUTPATIENT_CLINIC_OR_DEPARTMENT_OTHER): Payer: 59

## 2017-04-29 ENCOUNTER — Encounter (INDEPENDENT_AMBULATORY_CARE_PROVIDER_SITE_OTHER): Payer: Self-pay

## 2017-04-29 ENCOUNTER — Ambulatory Visit (INDEPENDENT_AMBULATORY_CARE_PROVIDER_SITE_OTHER): Payer: 59 | Admitting: Cardiology

## 2017-04-29 ENCOUNTER — Encounter: Payer: Self-pay | Admitting: Cardiology

## 2017-04-29 VITALS — BP 124/72 | HR 77 | Ht 67.0 in | Wt >= 6400 oz

## 2017-04-29 DIAGNOSIS — I10 Essential (primary) hypertension: Secondary | ICD-10-CM | POA: Diagnosis not present

## 2017-04-29 DIAGNOSIS — R748 Abnormal levels of other serum enzymes: Secondary | ICD-10-CM | POA: Diagnosis not present

## 2017-04-29 DIAGNOSIS — Z8249 Family history of ischemic heart disease and other diseases of the circulatory system: Secondary | ICD-10-CM

## 2017-04-29 DIAGNOSIS — R7989 Other specified abnormal findings of blood chemistry: Secondary | ICD-10-CM

## 2017-04-29 DIAGNOSIS — I5033 Acute on chronic diastolic (congestive) heart failure: Secondary | ICD-10-CM | POA: Diagnosis not present

## 2017-04-29 DIAGNOSIS — R778 Other specified abnormalities of plasma proteins: Secondary | ICD-10-CM

## 2017-04-29 DIAGNOSIS — E785 Hyperlipidemia, unspecified: Secondary | ICD-10-CM | POA: Diagnosis not present

## 2017-04-29 MED ORDER — FUROSEMIDE 40 MG PO TABS: 40.0000 mg | ORAL_TABLET | Freq: Every day | ORAL | 3 refills | Status: DC

## 2017-04-29 NOTE — Progress Notes (Signed)
Cardiology Office Note    Date:  04/29/2017   ID:  Jacob Hunt, DOB 06/21/1971, MRN [id number]  PCP:  Clovis Riley, Elbert Ewings.August Saucer, MD  Cardiologist: Dr. Delton See  Chief complain: One-month follow-up  History of Present Illness:  Jacob Hunt is a 46 y.o. male with history of normal coronary arteries 07/2015, morbid obesity BMI 66, HLD, family history of premature CAD. He was admitted to [facility] with chest pain that woke him from sleep. Troponins rose to 6.29. He underwent cardiac catheterization demonstrated widely patent coronary arteries with mild ectasia of the proximal LAD and minimal nonobstructive disease involving the proximal RCA unchanged from 2017. Had normal LV function. Etiology of troponins not totally clear. CTA was negative for PE. Medical therapy for blood pressure control and statins was recommended. Losartan HCTZ were added. Needs follow-up bmet. Patient is scheduled for sleep apnea test as well as a 30 day monitor to rule out any underlying tachyarrhythmia that could have precipitated this event.  Patient comes in accompanied by his wife and mother. He's had some dizziness after he takes his medications and leg cramps. They have been checking his blood pressures but have a regular sized cuff and need to get a large cuff as it doesn't coincides with our blood pressure readings. He like to return to work next week. 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 Leg swelling has improved and he lost 24 pounds the hospital. Had some minor chest pains but they're nonspecific and short lived. He also complains of cough wondering if some of this is, from his medications. He didn't take his medications this morning and he feels good without any symptoms.  04/29/2017, this is one month's follow-up, the patient is coming in accompanied by his wife, he denies any further episodes of chest pain or shortness of breath but has noticed worsening lower extremity edema. This is not responses to hydrochlorothiazide. He  doesn't have any orthopnea or proximal nocturnal dyspnea. No palpitations. He works for [employer] and recreation for the city of [place]. He states he walks a lot however when asked to exercise he says he cannot walk as his herniated disc but would be able to use stationary bike. He is trying to get into weight watcher class through his work.   Past Medical History:  Diagnosis Date  . Essential hypertension   . GERD (gastroesophageal reflux disease)   . Lumbar herniated disc   . Morbid obesity (HCC)     Past Surgical History:  Procedure Laterality Date  . CARDIAC CATHETERIZATION N/A 08/02/2015   Procedure: Left Heart Cath and Coronary Angiography;  Surgeon: Peter M Swaziland, MD;  Location: [facility] INVASIVE CV LAB;  Service: Cardiovascular;  Laterality: N/A;  . CARDIAC CATHETERIZATION  03/15/2017  . LEFT HEART CATH AND CORONARY ANGIOGRAPHY N/A 03/15/2017   Procedure: LEFT HEART CATH AND CORONARY ANGIOGRAPHY;  Surgeon: Tonny Bollman, MD;  Location: [facility] INVASIVE CV LAB;  Service: Cardiovascular;  Laterality: N/A;    Current Medications: Current Meds  Medication Sig  . aspirin EC 81 MG tablet Take 1 tablet (81 mg total) by mouth daily.  Marland Kitchen atorvastatin (LIPITOR) 80 MG tablet Take 1 tablet (80 mg total) by mouth daily at 6 PM.  . losartan (COZAAR) 50 MG tablet Take 1 tablet (50 mg total) by mouth daily.  . nitroGLYCERIN (NITROSTAT) 0.4 MG SL tablet Place 1 tablet (0.4 mg total) under the tongue every 5 (five) minutes x 3 doses as needed for chest pain.  . [DISCONTINUED] hydrochlorothiazide (HYDRODIURIL)  25 MG tablet Take 1 tablet (25 mg total) by mouth daily.     Allergies:   Patient has no known allergies.   Social History   Social History  . Marital status: Married    Spouse name: N/A  . Number of children: N/A  . Years of education: N/A   Social History Main Topics  . Smoking status: Never Smoker  . Smokeless tobacco: Never Used  . Alcohol use No  . Drug use: No  . Sexual activity:  Yes   Other Topics Concern  . None   Social History Narrative  . None     Family History:  The patient's   family history includes CAD in his brother, father, and mother; Cancer in his maternal aunt, maternal grandfather, and mother; Coronary artery disease in his maternal grandfather; Diabetes in his brother, father, and mother; Hypertension in his brother, father, and mother; Other in his other.   ROS:   Please see the history of present illness.    Review of Systems  Constitution: Positive for weakness.  HENT: Negative.   Cardiovascular: Positive for chest pain and leg swelling.  Respiratory: Positive for cough.   Endocrine: Negative.   Hematologic/Lymphatic: Negative.   Musculoskeletal: Negative.   Gastrointestinal: Negative.   Genitourinary: Negative.    All other systems reviewed and are negative.   PHYSICAL EXAM:   VS:  BP 124/72   Pulse 77   Ht 5\' 7"  (1.702 m)   Wt (!) 413 lb (187.3 kg)   SpO2 97%   BMI 64.68 kg/m   Physical Exam  GEN: Well nourished, well developed, in no acute distress  HEENT: normal  Neck: no JVD, carotid bruits, or masses Cardiac:RRR; no murmurs, rubs, or gallops  Respiratory:  clear to auscultation bilaterally, normal work of breathing GI: soft, nontender, nondistended, + BS Ext: without cyanosis, clubbing, or edema, Good distal pulses bilaterally MS: no deformity or atrophy  Skin: warm and dry, no rash Neuro:  Alert and Oriented x 3, Strength and sensation are intact Psych: euthymic mood, full affect  Wt Readings from Last 3 Encounters:  04/29/17 (!) 413 lb (187.3 kg)  03/23/17 (!) 403 lb (182.8 kg)  03/16/17 (!) 427 lb 11.1 oz (194 kg)      Studies/Labs Reviewed:   EKG:  EKG is not ordered today.    Recent Labs: 03/16/2017: Hemoglobin 13.8; Platelets 221 03/23/2017: BUN 17; Creatinine, Ser 0.94; Potassium 4.7; Sodium 134   Lipid Panel    Component Value Date/Time   CHOL 186 03/16/2017 0132   TRIG 235 (H) 03/16/2017 0132     HDL 36 (L) 03/16/2017 0132   CHOLHDL 5.2 03/16/2017 0132   VLDL 47 (H) 03/16/2017 0132   LDLCALC 103 (H) 03/16/2017 0132    Additional studies/ records that were reviewed today include:  2Decho 03/16/17 Study Conclusions   - Left ventricle: The cavity size was normal. There was mild focal   basal hypertrophy of the septum. Systolic function was normal.   The estimated ejection fraction was in the range of 55% to 60%.   Wall motion was normal; there were no regional wall motion   abnormalities. Doppler parameters are consistent with abnormal   left ventricular relaxation (grade 1 diastolic dysfunction).   Impressions:   - Technically diffiicult; definity used; normal LV systolic   function; mild diastolic dysfunction  CTA 03/16/17 IMPRESSION: 1. No evidence pulmonary embolus. 2. No active cardiopulmonary disease.     Electronically Signed  By: Elige KoHetal  Patel   On: 03/16/2017 16:02    Cardiac Cath 03/15/17 Conclusion  1. Widely patent coronary arteries with mild ectasia of the proximal LAD and minimal nonobstructive disease involving the proximal RCA, unchanged from 2017 cath 2. Normal LV systolic function   Suspect noncardiac symptoms      ASSESSMENT:    1. Acute on chronic diastolic CHF (congestive heart failure) (HCC)   2. Hyperlipidemia, unspecified hyperlipidemia type   3. Essential hypertension   4. Morbid obesity (HCC)   5. Family history of premature CAD   6. Elevated troponin      PLAN:  In order of problems listed above:  1. I will discontinue hydrochlorothiazide and start Lasix 40 mg daily, he is advised to take an extra Lasix in the afternoon if his lower extremity edema persist. The mainstay of therapy will be weight loss and regular exercise.   2. Elevated troponins with chest pain unclear etiology, cardiac catheterization with normal coronary arteries and unchanged from cath 2017. Echocardiogram shows normal LV EF with no regional wall motion  abnormalities.  3. Hypertension, well controlled we'll continue the same regimen.  4. Obstructive sleep apnea  -awaiting home sleep study.   5. Morbid obesity  - I will check his TSH today, he is encouraged to join weight loss programs through his work however if this fails he should continue bariatric surgery or at least get a consultation.   6. Hyperlipidemia  - will recheck lipids and LFTs.    Medication Adjustments/Labs and Tests Ordered: Current medicines are reviewed at length with the patient today.  Concerns regarding medicines are outlined above.  Medication changes, Labs and Tests ordered today are listed in the Patient Instructions below. Patient Instructions  Medication Instructions:   STOP TAKING HYDROCHLOROTHIAZIDE NOW  START TAKING LASIX 40 MG ONCE DAILY    Labwork:  COME AS SCHEDULED ON 05/03/17 AT OUR LAB--WE WILL ADD ON ADDITIONAL LABS ETC. CBC W DIFF, TSH, AND HEMOGLOBIN A1C.  MAKE SURE YOU COME FASTING TO THIS LAB APPOINTMENT.     Follow-Up:  3 MONTHS WITH DR Delton SeeNELSON       If you need a refill on your cardiac medications before your next appointment, please call your pharmacy.      Signed, Tobias AlexanderKatarina Jaleea Alesi, MD  04/29/2017 1:03 PM    [facility] [address], [place], [place]  [address] Phone: [phone]) [phone]; Fax: [phone]

## 2017-04-29 NOTE — Patient Instructions (Signed)
Medication Instructions:   STOP TAKING HYDROCHLOROTHIAZIDE NOW  START TAKING LASIX 40 MG ONCE DAILY    Labwork:  COME AS SCHEDULED ON 05/03/17 AT OUR LAB--WE WILL ADD ON ADDITIONAL LABS ETC. CBC W DIFF, TSH, AND HEMOGLOBIN A1C.  MAKE SURE YOU COME FASTING TO THIS LAB APPOINTMENT.     Follow-Up:  3 MONTHS WITH DR Delton SeeNELSON       If you need a refill on your cardiac medications before your next appointment, please call your pharmacy.

## 2017-05-03 ENCOUNTER — Other Ambulatory Visit: Payer: 59

## 2017-05-03 DIAGNOSIS — E782 Mixed hyperlipidemia: Secondary | ICD-10-CM

## 2017-05-03 DIAGNOSIS — I5033 Acute on chronic diastolic (congestive) heart failure: Secondary | ICD-10-CM

## 2017-05-03 DIAGNOSIS — E785 Hyperlipidemia, unspecified: Secondary | ICD-10-CM | POA: Diagnosis not present

## 2017-05-03 DIAGNOSIS — I10 Essential (primary) hypertension: Secondary | ICD-10-CM | POA: Diagnosis not present

## 2017-05-03 DIAGNOSIS — Z8249 Family history of ischemic heart disease and other diseases of the circulatory system: Secondary | ICD-10-CM

## 2017-05-03 LAB — HEPATIC FUNCTION PANEL
ALT: 25 IU/L (ref 0–44)
AST: 24 IU/L (ref 0–40)
Albumin: 3.9 g/dL (ref 3.5–5.5)
Alkaline Phosphatase: 85 IU/L (ref 39–117)
BILIRUBIN TOTAL: 0.4 mg/dL (ref 0.0–1.2)
Bilirubin, Direct: 0.13 mg/dL (ref 0.00–0.40)
TOTAL PROTEIN: 6 g/dL (ref 6.0–8.5)

## 2017-05-03 LAB — LIPID PANEL
CHOL/HDL RATIO: 2.9 ratio (ref 0.0–5.0)
Cholesterol, Total: 125 mg/dL (ref 100–199)
HDL: 43 mg/dL (ref >39)
LDL CALC: 46 mg/dL (ref 0–99)
TRIGLYCERIDES: 179 mg/dL — AB (ref 0–149)
VLDL Cholesterol Cal: 36 mg/dL (ref 5–40)

## 2017-05-04 LAB — CBC WITH DIFFERENTIAL/PLATELET
Basophils Absolute: 0 10*3/uL (ref 0.0–0.2)
Basos: 0 %
EOS (ABSOLUTE): 0.1 10*3/uL (ref 0.0–0.4)
Eos: 1 %
Hematocrit: 41.6 % (ref 37.5–51.0)
Hemoglobin: 14.1 g/dL (ref 13.0–17.7)
Immature Grans (Abs): 0 10*3/uL (ref 0.0–0.1)
Immature Granulocytes: 0 %
Lymphocytes Absolute: 2.6 10*3/uL (ref 0.7–3.1)
Lymphs: 36 %
MCH: 29.1 pg (ref 26.6–33.0)
MCHC: 33.9 g/dL (ref 31.5–35.7)
MCV: 86 fL (ref 79–97)
Monocytes Absolute: 0.7 10*3/uL (ref 0.1–0.9)
Monocytes: 9 %
Neutrophils Absolute: 3.8 10*3/uL (ref 1.4–7.0)
Neutrophils: 54 %
Platelets: 282 10*3/uL (ref 150–379)
RBC: 4.85 x10E6/uL (ref 4.14–5.80)
RDW: 13.9 % (ref 12.3–15.4)
WBC: 7.1 10*3/uL (ref 3.4–10.8)

## 2017-05-04 LAB — TSH: TSH: 3.45 u[IU]/mL (ref 0.450–4.500)

## 2017-05-04 LAB — HEMOGLOBIN A1C
Est. average glucose Bld gHb Est-mCnc: 114 mg/dL
Hgb A1c MFr Bld: 5.6 % (ref 4.8–5.6)

## 2017-05-17 NOTE — Telephone Encounter (Addendum)
Late Entry: Received a fax 05/12/17 for patient from [facility] stating the Home Sleep Test was cancelled for the reason: Does not meet payers medical policy requirements. Questions about this matter, please contact [facility] at [phone]. Reached out to [facility] says patients BMI should be less than 50. Patients BMI was 63 so he has to go into the lab to be monitored according to his insurance [employer].

## 2017-06-03 DIAGNOSIS — M545 Low back pain: Secondary | ICD-10-CM | POA: Diagnosis not present

## 2017-06-03 DIAGNOSIS — M5116 Intervertebral disc disorders with radiculopathy, lumbar region: Secondary | ICD-10-CM | POA: Diagnosis not present

## 2017-06-07 ENCOUNTER — Other Ambulatory Visit: Payer: Self-pay | Admitting: *Deleted

## 2017-06-07 MED ORDER — LOSARTAN POTASSIUM 50 MG PO TABS: 50.0000 mg | ORAL_TABLET | Freq: Every day | ORAL | 9 refills | Status: DC

## 2017-06-11 DIAGNOSIS — M6281 Muscle weakness (generalized): Secondary | ICD-10-CM | POA: Diagnosis not present

## 2017-06-11 DIAGNOSIS — M545 Low back pain: Secondary | ICD-10-CM | POA: Diagnosis not present

## 2017-06-16 DIAGNOSIS — M6281 Muscle weakness (generalized): Secondary | ICD-10-CM | POA: Diagnosis not present

## 2017-06-16 DIAGNOSIS — M545 Low back pain: Secondary | ICD-10-CM | POA: Diagnosis not present

## 2017-06-21 DIAGNOSIS — M6281 Muscle weakness (generalized): Secondary | ICD-10-CM | POA: Diagnosis not present

## 2017-06-21 DIAGNOSIS — M545 Low back pain: Secondary | ICD-10-CM | POA: Diagnosis not present

## 2017-06-23 DIAGNOSIS — M545 Low back pain: Secondary | ICD-10-CM | POA: Diagnosis not present

## 2017-06-23 DIAGNOSIS — M6281 Muscle weakness (generalized): Secondary | ICD-10-CM | POA: Diagnosis not present

## 2017-06-30 DIAGNOSIS — M6281 Muscle weakness (generalized): Secondary | ICD-10-CM | POA: Diagnosis not present

## 2017-06-30 DIAGNOSIS — M545 Low back pain: Secondary | ICD-10-CM | POA: Diagnosis not present

## 2017-07-05 DIAGNOSIS — M6281 Muscle weakness (generalized): Secondary | ICD-10-CM | POA: Diagnosis not present

## 2017-07-05 DIAGNOSIS — M545 Low back pain: Secondary | ICD-10-CM | POA: Diagnosis not present

## 2017-07-07 DIAGNOSIS — M545 Low back pain: Secondary | ICD-10-CM | POA: Diagnosis not present

## 2017-07-07 DIAGNOSIS — M6281 Muscle weakness (generalized): Secondary | ICD-10-CM | POA: Diagnosis not present

## 2017-07-12 DIAGNOSIS — M6281 Muscle weakness (generalized): Secondary | ICD-10-CM | POA: Diagnosis not present

## 2017-07-12 DIAGNOSIS — M545 Low back pain: Secondary | ICD-10-CM | POA: Diagnosis not present

## 2017-07-14 DIAGNOSIS — M6281 Muscle weakness (generalized): Secondary | ICD-10-CM | POA: Diagnosis not present

## 2017-07-14 DIAGNOSIS — M545 Low back pain: Secondary | ICD-10-CM | POA: Diagnosis not present

## 2017-07-19 DIAGNOSIS — M545 Low back pain: Secondary | ICD-10-CM | POA: Diagnosis not present

## 2017-07-19 DIAGNOSIS — M6281 Muscle weakness (generalized): Secondary | ICD-10-CM | POA: Diagnosis not present

## 2017-07-21 DIAGNOSIS — M6281 Muscle weakness (generalized): Secondary | ICD-10-CM | POA: Diagnosis not present

## 2017-07-21 DIAGNOSIS — M545 Low back pain: Secondary | ICD-10-CM | POA: Diagnosis not present

## 2017-07-26 DIAGNOSIS — M545 Low back pain: Secondary | ICD-10-CM | POA: Diagnosis not present

## 2017-07-26 DIAGNOSIS — M6281 Muscle weakness (generalized): Secondary | ICD-10-CM | POA: Diagnosis not present

## 2017-08-02 ENCOUNTER — Encounter: Payer: Self-pay | Admitting: Cardiology

## 2017-08-02 ENCOUNTER — Ambulatory Visit: Payer: 59 | Admitting: Cardiology

## 2017-08-02 VITALS — BP 112/72 | HR 68 | Ht 67.0 in | Wt >= 6400 oz

## 2017-08-02 DIAGNOSIS — I10 Essential (primary) hypertension: Secondary | ICD-10-CM | POA: Diagnosis not present

## 2017-08-02 DIAGNOSIS — I5032 Chronic diastolic (congestive) heart failure: Secondary | ICD-10-CM

## 2017-08-02 DIAGNOSIS — E785 Hyperlipidemia, unspecified: Secondary | ICD-10-CM

## 2017-08-02 LAB — COMPREHENSIVE METABOLIC PANEL
ALT: 21 IU/L (ref 0–44)
AST: 21 IU/L (ref 0–40)
Albumin/Globulin Ratio: 1.8 (ref 1.2–2.2)
Albumin: 4 g/dL (ref 3.5–5.5)
Alkaline Phosphatase: 97 IU/L (ref 39–117)
BUN/Creatinine Ratio: 16 (ref 9–20)
BUN: 13 mg/dL (ref 6–24)
Bilirubin Total: 0.7 mg/dL (ref 0.0–1.2)
CO2: 23 mmol/L (ref 20–29)
Calcium: 9.6 mg/dL (ref 8.7–10.2)
Chloride: 100 mmol/L (ref 96–106)
Creatinine, Ser: 0.81 mg/dL (ref 0.76–1.27)
GFR calc Af Amer: 125 mL/min/{1.73_m2} (ref >59)
GFR calc non Af Amer: 108 mL/min/{1.73_m2} (ref >59)
Globulin, Total: 2.2 g/dL (ref 1.5–4.5)
Glucose: 104 mg/dL — ABNORMAL HIGH (ref 65–99)
Potassium: 4.5 mmol/L (ref 3.5–5.2)
Sodium: 140 mmol/L (ref 134–144)
Total Protein: 6.2 g/dL (ref 6.0–8.5)

## 2017-08-02 NOTE — Patient Instructions (Signed)
Medication Instructions:   Your physician recommends that you continue on your current medications as directed. Please refer to the Current Medication list given to you today.   Labwork:  TODAY--CMET    Follow-Up:  IN June 2019 WITH DR Delton SeeNELSON       If you need a refill on your cardiac medications before your next appointment, please call your pharmacy.

## 2017-08-02 NOTE — Progress Notes (Signed)
Cardiology Office Note    Date:  08/02/2017   ID:  Jacob Hunt, DOB Jul 31, 1970, MRN [id number]  PCP:  Clovis RileyMitchell, Elbert EwingsL.August Saucerean, MD  Cardiologist: Dr. Delton SeeNelson  Chief complain: One-month follow-up  History of Present Illness:  Jacob Hunt is a 47 y.o. male with history of normal coronary arteries 07/2015, morbid obesity BMI 66, HLD, family history of premature CAD. He was [facility] to [facility] with chest pain that woke him from sleep. Troponins rose to 6.29. He underwent cardiac catheterization demonstrated widely patent coronary arteries with mild ectasia of the proximal LAD and minimal nonobstructive disease involving the proximal RCA unchanged from 2017. Had normal LV function. Etiology of troponins not totally clear. CTA was negative for PE. Medical therapy for blood pressure control and statins was recommended. Losartan HCTZ were added. Needs follow-up bmet. Patient is scheduled for sleep apnea test as well as a 30 day monitor to rule out any underlying tachyarrhythmia that could have precipitated this event.  Patient comes in accompanied by his wife and mother. He's had some dizziness after he takes his medications and leg cramps. They have been checking his blood pressures but have a regular sized cuff and need to get a large cuff as it doesn't coincides with our blood pressure readings. He like to return to work next week. 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 Leg swelling has improved and he lost 24 pounds the hospital. Had some minor chest pains but they're nonspecific and short lived. He also complains of cough wondering if some of this is, from his medications. He didn't take his medications this morning and he feels good without any symptoms.  04/29/2017, this is one month's follow-up, the patient is coming in accompanied by his wife, he denies any further episodes of chest pain or shortness of breath but has noticed worsening lower extremity edema. This is not responses to hydrochlorothiazide. He  doesn't have any orthopnea or proximal nocturnal dyspnea. No palpitations. He works for [employer] [place]. He states he walks a lot however when asked to exercise he says he cannot walk as his herniated disc but would be able to use stationary bike. He is trying to get into weight watcher class through his work.  08/02/2017 - 3 months follow-up, the patient states that Lasix but his lower extremity edema significantly. However in December he injured his back and was diagnosed with a herniated disc. He was bedbound for months unable to work and again 10 pounds. His now undergoing physical therapy and also going to gym 3 times a week, determined to loose weight. No more chest pain shortness of breath dizziness.   Past Medical History:  Diagnosis Date  . Essential hypertension   . GERD (gastroesophageal reflux disease)   . Lumbar herniated disc   . Morbid obesity (HCC)     Past Surgical History:  Procedure Laterality Date  . CARDIAC CATHETERIZATION N/A 08/02/2015   Procedure: Left Heart Cath and Coronary Angiography;  Surgeon: Peter M SwazilandJordan, MD;  [facility];  Service: Cardiovascular;  Laterality: N/A;  . CARDIAC CATHETERIZATION  03/15/2017  . LEFT HEART CATH AND CORONARY ANGIOGRAPHY N/A 03/15/2017   Procedure: LEFT HEART CATH AND CORONARY ANGIOGRAPHY;  Surgeon: Tonny Bollmanooper, Michael, MD;  [facility];  Service: Cardiovascular;  Laterality: N/A;    Current Medications: Current Meds  Medication Sig  . aspirin EC 81 MG tablet Take 1 tablet (81 mg total) by mouth daily.  .Marland Kitchen  atorvastatin (LIPITOR) 80 MG tablet Take 1 tablet (80 mg total) by mouth daily at 6 PM.  . losartan (COZAAR) 50 MG tablet Take 1 tablet (50 mg total) by mouth daily.  . nitroGLYCERIN (NITROSTAT) 0.4 MG SL tablet Place 1 tablet (0.4 mg total) under the tongue every 5 (five) minutes x 3 doses as needed for chest pain.     Allergies:   Patient has no known allergies.    Social History   Socioeconomic History  . Marital status: Married    Spouse name: None  . Number of children: None  . Years of education: None  . Highest education level: None  Social Needs  . Financial resource strain: None  . Food insecurity - worry: None  . Food insecurity - inability: None  . Transportation needs - medical: None  . Transportation needs - non-medical: None  Occupational History  . None  Tobacco Use  . Smoking status: Never Smoker  . Smokeless tobacco: Never Used  Substance and Sexual Activity  . Alcohol use: No    Alcohol/week: 0.0 oz  . Drug use: No  . Sexual activity: Yes  Other Topics Concern  . None  Social History Narrative  . None     Family History:  The patient's   family history includes CAD in his brother, father, and mother; Cancer in his maternal aunt, maternal grandfather, and mother; Coronary artery disease in his maternal grandfather; Diabetes in his brother, father, and mother; Hypertension in his brother, father, and mother; Other in his other.   ROS:   Please see the history of present illness.    Review of Systems  Constitution: Positive for weakness.  HENT: Negative.   Cardiovascular: Positive for chest pain and leg swelling.  Respiratory: Positive for cough.   Endocrine: Negative.   Hematologic/Lymphatic: Negative.   Musculoskeletal: Negative.   Gastrointestinal: Negative.   Genitourinary: Negative.    All other systems reviewed and are negative.   PHYSICAL EXAM:   VS:  BP 112/72   Pulse 68   Ht 5\' 7"  (1.702 m)   Wt (!) 414 lb (187.8 kg)   SpO2 97%   BMI 64.84 kg/m   Physical Exam  GEN: Well nourished, well developed, in no acute distress  HEENT: normal  Neck: no JVD, carotid bruits, or masses Cardiac:RRR; no murmurs, rubs, or gallops  Respiratory:  clear to auscultation bilaterally, normal work of breathing GI: soft, nontender, nondistended, + BS Ext: without cyanosis, clubbing, or edema, Good distal pulses  bilaterally MS: no deformity or atrophy  Skin: warm and dry, no rash Neuro:  Alert and Oriented x 3, Strength and sensation are intact Psych: euthymic mood, full affect  Wt Readings from Last 3 Encounters:  08/02/17 (!) 414 lb (187.8 kg)  04/29/17 (!) 413 lb (187.3 kg)  03/23/17 (!) 403 lb (182.8 kg)      Studies/Labs Reviewed:   EKG:  EKG is not ordered today.    Recent Labs: 03/23/2017: BUN 17; Creatinine, Ser 0.94; Potassium 4.7; Sodium 134 05/03/2017: ALT 25; Hemoglobin 14.1; Platelets 282; TSH 3.450   Lipid Panel    Component Value Date/Time   CHOL 125 05/03/2017 0935   TRIG 179 (H) 05/03/2017 0935   HDL 43 05/03/2017 0935   CHOLHDL 2.9 05/03/2017 0935   CHOLHDL 5.2 03/16/2017 0132   VLDL 47 (H) 03/16/2017 0132   LDLCALC 46 05/03/2017 0935    Additional studies/ records that were reviewed today include:  2Decho 03/16/17 Study Conclusions   -  Left ventricle: The cavity size was normal. There was mild focal   basal hypertrophy of the septum. Systolic function was normal.   The estimated ejection fraction was in the range of 55% to 60%.   Wall motion was normal; there were no regional wall motion   abnormalities. Doppler parameters are consistent with abnormal   left ventricular relaxation (grade 1 diastolic dysfunction).   Impressions:   - Technically diffiicult; definity used; normal LV systolic   function; mild diastolic dysfunction  CTA 03/16/17 IMPRESSION: 1. No evidence pulmonary embolus. 2. No active cardiopulmonary disease.     Electronically Signed   By: Elige Ko   On: 03/16/2017 16:02    Cardiac Cath 03/15/17 Conclusion  1. Widely patent coronary arteries with mild ectasia of the proximal LAD and minimal nonobstructive disease involving the proximal RCA, unchanged from 2017 cath 2. Normal LV systolic function   Suspect noncardiac symptoms      ASSESSMENT:    1. Hyperlipidemia, unspecified hyperlipidemia type   2. Essential  hypertension   3. Morbid obesity (HCC)   4. Chronic diastolic CHF (congestive heart failure) (HCC)      PLAN:  In order of problems listed above:  1. Chronic diastolic CHF - continue Lasix daily, check electrolytes, kidney function today.  2. Elevated troponins with chest pain unclear etiology, cardiac catheterization with normal coronary arteries and unchanged from cath 2017. Echocardiogram shows normal LV EF with no regional wall motion abnormalities.  3. Hypertension, well controlled we'll continue the same regimen.  4. Obstructive sleep apnea  - awaiting home sleep study.   5. Morbid obesity  - TSH normal, he struggled with weight for the last 10 years, trying different diets, always loose 20 pounds but then gains 30. He is explained bariatric surgery, he wants to try on his own sometime, we'll follow-up in 6 months with goal to 30 pound loss and if that fails he might consider bariatric surgery then.  6. Hyperlipidemia  - will recheck LFTs. Continue atorvastatin.   Medication Adjustments/Labs and Tests Ordered: Current medicines are reviewed at length with the patient today.  Concerns regarding medicines are outlined above.  Medication changes, Labs and Tests ordered today are listed in the Patient Instructions below. Patient Instructions  Medication Instructions:   Your physician recommends that you continue on your current medications as directed. Please refer to the Current Medication list given to you today.   Labwork:  TODAY--CMET    Follow-Up:  IN June 2019 WITH DR Delton See       If you need a refill on your cardiac medications before your next appointment, please call your pharmacy.      Signed, Tobias Alexander, MD  08/02/2017 9:04 AM    [facility] [address] [place], [place], [place]  [address] Phone: [phone]; Fax: [phone]

## 2017-08-09 DIAGNOSIS — M545 Low back pain: Secondary | ICD-10-CM | POA: Diagnosis not present

## 2017-08-09 DIAGNOSIS — M6281 Muscle weakness (generalized): Secondary | ICD-10-CM | POA: Diagnosis not present

## 2017-11-19 ENCOUNTER — Encounter: Payer: Self-pay | Admitting: Cardiology

## 2017-12-12 NOTE — Progress Notes (Signed)
Cardiology Office Note    Date:  12/13/2017   ID:  Jacob Journeyhomas S Crutcher, DOB Apr 25, 1971, MRN [id number]  PCP:  Clovis RileyMitchell, Elbert EwingsL.August Saucerean, MD  Cardiologist: Dr. Delton SeeNelson  Chief complain: 4 months follow-up  History of Present Illness:  Jacob Hunt is a 47 y.o. male with history of normal coronary arteries 07/2015, morbid obesity BMI 66, HLD, family history of premature CAD. [facility]2017 with chest pain that woke him from sleep. Troponins rose to 6.29. He underwent cardiac catheterization demonstrated widely patent coronary arteries with mild ectasia of the proximal LAD and minimal nonobstructive disease involving the proximal RCA unchanged from 2017. Had normal LV function. Etiology of troponins not totally clear. CTA was negative for PE. Medical therapy for blood pressure control and statins was recommended. Losartan HCTZ were added. Needs follow-up bmet. Patient is scheduled for sleep apnea test as well as a 30 day monitor to rule out any underlying tachyarrhythmia that could have precipitated this event. He has been treated for lower extremity edema with lasix.  08/02/2017 - 3 months follow-up, the patient states that Lasix but his lower extremity edema significantly. However in December he injured his back and was diagnosed with a herniated disc. He was bedbound for months unable to work and again 10 pounds. His now undergoing physical therapy and also going to gym 3 times a week, determined to loose weight. No more chest pain shortness of breath dizziness.  12/13/17 - 4 months follow up, no admission since the last visit, he has been feeling well, he denies any chest pain shortness of breath no orthopnea proximal nocturnal dyspnea.  He developed lower extremity edema only when he forgets to take his Lasix.  No dizziness or syncope.  He is trying to go to gym once a week.  Past Medical History:  Diagnosis Date  . Essential hypertension   . GERD (gastroesophageal reflux disease)     . Lumbar herniated disc   . Morbid obesity (HCC)    Past Surgical History:  Procedure Laterality Date  . CARDIAC CATHETERIZATION N/A 08/02/2015   Procedure: Left Heart Cath and Coronary Angiography;  Surgeon: Peter M SwazilandJordan, MD;  [facility];  Service: Cardiovascular;  Laterality: N/A;  . CARDIAC CATHETERIZATION  03/15/2017  . LEFT HEART CATH AND CORONARY ANGIOGRAPHY N/A 03/15/2017   Procedure: LEFT HEART CATH AND CORONARY ANGIOGRAPHY;  Surgeon: Tonny Bollmanooper, Michael, MD;  [facility];  Service: Cardiovascular;  Laterality: N/A;    Current Medications: Current Meds  Medication Sig  . aspirin EC 81 MG tablet Take 1 tablet (81 mg total) by mouth daily.  Marland Kitchen. atorvastatin (LIPITOR) 80 MG tablet Take 1 tablet (80 mg total) by mouth daily at 6 PM.  . losartan (COZAAR) 50 MG tablet Take 1 tablet (50 mg total) by mouth daily.  . nitroGLYCERIN (NITROSTAT) 0.4 MG SL tablet Place 1 tablet (0.4 mg total) under the tongue every 5 (five) minutes x 3 doses as needed for chest pain.     Allergies:   Patient has no known allergies.   Social History   Socioeconomic History  . Marital status: Married    Spouse name: Not on file  . Number of children: Not on file  . Years of education: Not on file  . Highest education level: Not on file  Occupational History  . Not on file  Social Needs  . Financial resource strain: Not on file  . Food insecurity:  Worry: Not on file    Inability: Not on file  . Transportation needs:    Medical: Not on file    Non-medical: Not on file  Tobacco Use  . Smoking status: Never Smoker  . Smokeless tobacco: Never Used  Substance and Sexual Activity  . Alcohol use: No    Alcohol/week: 0.0 oz  . Drug use: No  . Sexual activity: Yes  Lifestyle  . Physical activity:    Days per week: Not on file    Minutes per session: Not on file  . Stress: Not on file  Relationships  . Social connections:    Talks on phone: Not on file    Gets  together: Not on file    Attends religious service: Not on file    Active member of club or organization: Not on file    Attends meetings of clubs or organizations: Not on file    Relationship status: Not on file  Other Topics Concern  . Not on file  Social History Narrative  . Not on file     Family History:  The patient's   family history includes CAD in his brother, father, and mother; Cancer in his maternal aunt, maternal grandfather, and mother; Coronary artery disease in his maternal grandfather; Diabetes in his brother, father, and mother; Hypertension in his brother, father, and mother; Other in his other.   ROS:   Please see the history of present illness.    Review of Systems  HENT: Negative.   Cardiovascular: Positive for chest pain and leg swelling.  Respiratory: Positive for cough.   Endocrine: Negative.   Hematologic/Lymphatic: Negative.   Musculoskeletal: Negative.   Gastrointestinal: Negative.   Genitourinary: Negative.   Neurological: Positive for weakness.   All other systems reviewed and are negative.   PHYSICAL EXAM:   VS:  BP 122/80   Pulse 71   Ht 5\' 7"  (1.702 m)   Wt (!) 412 lb 12.8 oz (187.2 kg)   SpO2 96%   BMI 64.65 kg/m   Physical Exam  GEN: Well nourished, well developed, in no acute distress  HEENT: normal  Neck: no JVD, carotid bruits, or masses Cardiac:RRR; no murmurs, rubs, or gallops  Respiratory:  clear to auscultation bilaterally, normal work of breathing GI: soft, nontender, nondistended, + BS Ext: without cyanosis, clubbing, or edema, Good distal pulses bilaterally MS: no deformity or atrophy  Skin: warm and dry, no rash Neuro:  Alert and Oriented x 3, Strength and sensation are intact Psych: euthymic mood, full affect  Wt Readings from Last 3 Encounters:  12/13/17 (!) 412 lb 12.8 oz (187.2 kg)  08/02/17 (!) 414 lb (187.8 kg)  04/29/17 (!) 413 lb (187.3 kg)      Studies/Labs Reviewed:   EKG:  EKG is not ordered today.     Recent Labs: 05/03/2017: Hemoglobin 14.1; Platelets 282; TSH 3.450 08/02/2017: ALT 21; BUN 13; Creatinine, Ser 0.81; Potassium 4.5; Sodium 140   Lipid Panel    Component Value Date/Time   CHOL 125 05/03/2017 0935   TRIG 179 (H) 05/03/2017 0935   HDL 43 05/03/2017 0935   CHOLHDL 2.9 05/03/2017 0935   CHOLHDL 5.2 03/16/2017 0132   VLDL 47 (H) 03/16/2017 0132   LDLCALC 46 05/03/2017 0935    Additional studies/ records that were reviewed today include:  2Decho 03/16/17 Study Conclusions   - Left ventricle: The cavity size was normal. There was mild focal   basal hypertrophy of the septum. Systolic function  was normal.   The estimated ejection fraction was in the range of 55% to 60%.   Wall motion was normal; there were no regional wall motion   abnormalities. Doppler parameters are consistent with abnormal   left ventricular relaxation (grade 1 diastolic dysfunction).   Impressions: - Technically diffiicult; definity used; normal LV systolic   function; mild diastolic dysfunction  CTA 03/16/17 IMPRESSION: 1. No evidence pulmonary embolus. 2. No active cardiopulmonary disease.   Cardiac Cath 03/15/17 Conclusion  1. Widely patent coronary arteries with mild ectasia of the proximal LAD and minimal nonobstructive disease involving the proximal RCA, unchanged from 2017 cath 2. Normal LV systolic function   Suspect noncardiac symptoms    ASSESSMENT:    1. Hyperlipidemia, unspecified hyperlipidemia type   2. Essential hypertension   3. Chronic diastolic CHF (congestive heart failure) (HCC)   4. Elevated troponin      PLAN:  In order of problems listed above:  1. Chronic diastolic CHF - continue Lasix daily, electrolytes and creatinine normal in January 2019, he is advised to take extra Lasix in the afternoon on days his swelling is worse.  2. H/o elevated troponins with chest pain unclear etiology, cardiac catheterization with normal coronary arteries and unchanged from  cath 2017. Echocardiogram shows normal LV EF with no regional wall motion abnormalities.  3. Hypertension, well controlled we'll continue the same regimen.  4. Obstructive sleep apnea  - awaiting home sleep study.   5. Morbid obesity  - TSH normal, he struggled with weight for the last 10 years, trying different diets, always loose 20 pounds but then gains 30. He is explained bariatric surgery, he is reluctant and basically not interested.  6. Hyperlipidemia  -on atorvastatin 80 mg daily, he is tolerating it well, LFTs are normal.  Medication Adjustments/Labs and Tests Ordered: Current medicines are reviewed at length with the patient today.  Concerns regarding medicines are outlined above.  Medication changes, Labs and Tests ordered today are listed in the Patient Instructions below. Patient Instructions  Medication Instructions:   Your physician recommends that you continue on your current medications as directed. Please refer to the Current Medication list given to you today.    Labwork:  IN ONE YEAR---PRIOR TO YOUR ONE YEAR FOLLOW-UP APPOINTMENT WITH DR Karletta Millay TO CHECK--CMET, CBC W DIFF, TSH, AND LIPIDS---PLEASE COME FASTING TO THIS LAB APPOINTMENT     Follow-Up:  Your physician wants you to follow-up in: ONE YEAR WITH DR Johnell Comings will receive a reminder letter in the mail two months in advance. If you don't receive a letter, please call our office to schedule the follow-up appointment.  PLEASE HAVE YOUR LABS DONE A WEEK OR SO PRIOR TO THIS FOLLOW-UP APPOINTMENT        If you need a refill on your cardiac medications before your next appointment, please call your pharmacy.      Signed, Tobias Alexander, MD  12/13/2017 8:32 AM    [facility] [address] [place], [place], [place]  [address] Phone: [phone]; Fax: [phone]

## 2017-12-13 ENCOUNTER — Encounter (INDEPENDENT_AMBULATORY_CARE_PROVIDER_SITE_OTHER): Payer: Self-pay

## 2017-12-13 ENCOUNTER — Ambulatory Visit: Payer: 59 | Admitting: Cardiology

## 2017-12-13 ENCOUNTER — Encounter: Payer: Self-pay | Admitting: Cardiology

## 2017-12-13 VITALS — BP 122/80 | HR 71 | Ht 67.0 in | Wt >= 6400 oz

## 2017-12-13 DIAGNOSIS — E785 Hyperlipidemia, unspecified: Secondary | ICD-10-CM | POA: Diagnosis not present

## 2017-12-13 DIAGNOSIS — R748 Abnormal levels of other serum enzymes: Secondary | ICD-10-CM

## 2017-12-13 DIAGNOSIS — R778 Other specified abnormalities of plasma proteins: Secondary | ICD-10-CM

## 2017-12-13 DIAGNOSIS — R7989 Other specified abnormal findings of blood chemistry: Secondary | ICD-10-CM

## 2017-12-13 DIAGNOSIS — I10 Essential (primary) hypertension: Secondary | ICD-10-CM | POA: Diagnosis not present

## 2017-12-13 DIAGNOSIS — I5032 Chronic diastolic (congestive) heart failure: Secondary | ICD-10-CM

## 2017-12-13 NOTE — Patient Instructions (Signed)
Medication Instructions:   Your physician recommends that you continue on your current medications as directed. Please refer to the Current Medication list given to you today.    Labwork:  IN ONE YEAR---PRIOR TO YOUR ONE YEAR FOLLOW-UP APPOINTMENT WITH DR NELSON TO CHECK--CMET, CBC W DIFF, TSH, AND LIPIDS---PLEASE COME FASTING TO THIS LAB APPOINTMENT     Follow-Up:  Your physician wants you to follow-up in: ONE YEAR WITH DR Johnell ComingsNELSON You will receive a reminder letter in the mail two months in advance. If you don't receive a letter, please call our office to schedule the follow-up appointment.  PLEASE HAVE YOUR LABS DONE A WEEK OR SO PRIOR TO THIS FOLLOW-UP APPOINTMENT        If you need a refill on your cardiac medications before your next appointment, please call your pharmacy.

## 2018-02-02 DIAGNOSIS — R6883 Chills (without fever): Secondary | ICD-10-CM | POA: Diagnosis not present

## 2018-03-18 DIAGNOSIS — Z3141 Encounter for fertility testing: Secondary | ICD-10-CM | POA: Diagnosis not present

## 2018-04-20 ENCOUNTER — Other Ambulatory Visit: Payer: Self-pay | Admitting: *Deleted

## 2018-04-20 MED ORDER — ATORVASTATIN CALCIUM 80 MG PO TABS: 80.0000 mg | ORAL_TABLET | Freq: Every day | ORAL | 6 refills | Status: DC

## 2018-06-24 ENCOUNTER — Other Ambulatory Visit: Payer: Self-pay | Admitting: Cardiology

## 2018-06-24 MED ORDER — LOSARTAN POTASSIUM 50 MG PO TABS: 50.0000 mg | ORAL_TABLET | Freq: Every day | ORAL | 5 refills | Status: DC

## 2018-06-24 NOTE — Telephone Encounter (Signed)
Pt's medication was sent to pt's pharmacy as requested. Confirmation received.  °

## 2018-06-27 ENCOUNTER — Other Ambulatory Visit: Payer: Self-pay | Admitting: Physician Assistant

## 2018-10-08 ENCOUNTER — Other Ambulatory Visit: Payer: Self-pay | Admitting: Cardiology

## 2018-10-08 DIAGNOSIS — Z8249 Family history of ischemic heart disease and other diseases of the circulatory system: Secondary | ICD-10-CM

## 2018-10-08 DIAGNOSIS — I10 Essential (primary) hypertension: Secondary | ICD-10-CM

## 2018-10-08 DIAGNOSIS — I5033 Acute on chronic diastolic (congestive) heart failure: Secondary | ICD-10-CM

## 2018-10-08 DIAGNOSIS — E785 Hyperlipidemia, unspecified: Secondary | ICD-10-CM

## 2019-01-12 ENCOUNTER — Other Ambulatory Visit: Payer: Self-pay | Admitting: Cardiology

## 2019-01-12 ENCOUNTER — Telehealth: Payer: Self-pay | Admitting: *Deleted

## 2019-01-12 MED ORDER — ATORVASTATIN CALCIUM 80 MG PO TABS: 80.0000 mg | ORAL_TABLET | Freq: Every day | ORAL | 0 refills | Status: DC

## 2019-01-12 NOTE — Telephone Encounter (Signed)
Pt's medication was sent to pt's pharmacy as requested. Confirmation received.  °

## 2019-01-12 NOTE — Telephone Encounter (Signed)
Left the pt a message to call the office back to discuss setting up his virtual visit with Dr Meda Coffee on 7/16 at 0800, with labs to be done a few days prior to this visit.

## 2019-01-13 ENCOUNTER — Telehealth: Payer: Self-pay | Admitting: Cardiology

## 2019-01-13 NOTE — Telephone Encounter (Signed)
New message     Patient returning your call and will have labs done 7/13.

## 2019-01-13 NOTE — Telephone Encounter (Signed)
Virtual Visit Pre-Appointment Phone Call  "(Name), I am calling you today to discuss your upcoming appointment. We are currently trying to limit exposure to the virus that causes COVID-19 by seeing patients at home rather than in the office."  1. "What is the BEST phone number to call the day of the visit?" - include this in appointment notes-YES UPDATED  2. "Do you have or have access to (through a family member/friend) a smartphone with video capability that we can use for your visit?"  a. If no - list the appointment type as a PHONE visit in appointment notes-YES UPDATED  3. Confirm consent - "In the setting of the current Covid19 crisis, you are scheduled for a (phone ) visit with Dr. Meda Coffee on 01/19/19 at 0800.  Just as we do with many in-office visits, in order for you to participate in this visit, we must obtain consent.  If you'd like, I can send this to your mychart (if signed up) or email for you to review.  Otherwise, I can obtain your verbal consent now.  All virtual visits are billed to your insurance company just like a normal visit would be.  By agreeing to a virtual visit, we'd like you to understand that the technology does not allow for your provider to perform an examination, and thus may limit your provider's ability to fully assess your condition. If your provider identifies any concerns that need to be evaluated in person, we will make arrangements to do so.  Finally, though the technology is pretty good, we cannot assure that it will always work on either your or our end, and in the setting of a video visit, we may have to convert it to a phone-only visit.  In either situation, we cannot ensure that we have a secure connection.  Are you willing to proceed?" STAFF: Did the patient verbally acknowledge consent to telehealth visit? Document YES/NO here: YES PT GAVE VERBAL CONSENT FOR DR Meda Coffee TO TREAT HIM VIA TELEPHONE VISIT--PT NOT INTERESTED IN SIGNING UP FOR [place].  4.  Advise patient to be prepared - "Two hours prior to your appointment, go ahead and check your blood pressure, pulse, oxygen saturation, and your weight (if you have the equipment to check those) and write them all down. When your visit starts, your provider will ask you for this information. If you have an Apple Watch or Kardia device, please plan to have heart rate information ready on the day of your appointment. Please have a pen and paper handy nearby the day of the visit as well."--PT WILL TRY TO TAKE BUT SAID HIS BP CUFF HAS BEEN OFF HERE LATELY   5. Inform patient they will receive a phone call 15 minutes prior to their appointment time (may be from unknown caller ID) so they should be prepared to answer--YES PT AWARE    TELEPHONE CALL NOTE  Jacob Hunt has been deemed a candidate for a follow-up tele-health visit to limit community exposure during the Covid-19 pandemic. I spoke with the patient via phone to ensure availability of phone/video source, confirm preferred email & phone number, and discuss instructions and expectations.  I reminded Jacob Hunt to be prepared with any vital sign and/or heart rhythm information that could potentially be obtained via home monitoring, at the time of his visit. I reminded Jacob Hunt to expect a phone call prior to his visit.  Nuala Alpha, LPN 6/96/2952 84:13 AM  FULL LENGTH CONSENT FOR TELE-HEALTH VISIT   I hereby voluntarily request, consent and authorize [facility] and its employed or contracted physicians, physician assistants, nurse practitioners or other licensed health care professionals (the Practitioner), to provide me with telemedicine health care services (the "Services") as deemed necessary by the treating Practitioner. I acknowledge and consent to receive the Services by the Practitioner via telemedicine. I understand that the telemedicine visit will involve communicating with the Practitioner through live  audiovisual communication technology and the disclosure of certain medical information by electronic transmission. I acknowledge that I have been given the opportunity to request an in-person assessment or other available alternative prior to the telemedicine visit and am voluntarily participating in the telemedicine visit.  I understand that I have the right to withhold or withdraw my consent to the use of telemedicine in the course of my care at any time, without affecting my right to future care or treatment, and that the Practitioner or I may terminate the telemedicine visit at any time. I understand that I have the right to inspect all information obtained and/or recorded in the course of the telemedicine visit and may receive copies of available information for a reasonable fee.  I understand that some of the potential risks of receiving the Services via telemedicine include:  Marland Kitchen. Delay or interruption in medical evaluation due to technological equipment failure or disruption; . Information transmitted may not be sufficient (e.g. poor resolution of images) to allow for appropriate medical decision making by the Practitioner; and/or  . In rare instances, security protocols could fail, causing a breach of personal health information.  Furthermore, I acknowledge that it is my responsibility to provide information about my medical history, conditions and care that is complete and accurate to the best of my ability. I acknowledge that Practitioner's advice, recommendations, and/or decision may be based on factors not within their control, such as incomplete or inaccurate data provided by me or distortions of diagnostic images or specimens that may result from electronic transmissions. I understand that the practice of medicine is not an exact science and that Practitioner makes no warranties or guarantees regarding treatment outcomes. I acknowledge that I will receive a copy of this consent concurrently upon  execution via email to the email address I last provided but may also request a printed copy by calling the office of [facility].    I understand that my insurance will be billed for this visit.   I have read or had this consent read to me. . I understand the contents of this consent, which adequately explains the benefits and risks of the Services being provided via telemedicine.  . I have been provided ample opportunity to ask questions regarding this consent and the Services and have had my questions answered to my satisfaction. . I give my informed consent for the services to be provided through the use of telemedicine in my medical care  By participating in this telemedicine visit I agree to the above.--PT GAVE VERBAL CONSENT FOR DR NELSON TO TREAT HIM VIA TELEPHONE VISIT ON 7/16 AT 0800.

## 2019-01-16 ENCOUNTER — Other Ambulatory Visit: Payer: 59 | Admitting: *Deleted

## 2019-01-16 ENCOUNTER — Encounter (INDEPENDENT_AMBULATORY_CARE_PROVIDER_SITE_OTHER): Payer: Self-pay

## 2019-01-16 ENCOUNTER — Other Ambulatory Visit: Payer: Self-pay

## 2019-01-16 DIAGNOSIS — I10 Essential (primary) hypertension: Secondary | ICD-10-CM

## 2019-01-16 DIAGNOSIS — E785 Hyperlipidemia, unspecified: Secondary | ICD-10-CM

## 2019-01-17 ENCOUNTER — Telehealth: Payer: Self-pay | Admitting: *Deleted

## 2019-01-17 ENCOUNTER — Telehealth: Payer: Self-pay | Admitting: Cardiology

## 2019-01-17 LAB — COMPREHENSIVE METABOLIC PANEL
ALT: 18 IU/L (ref 0–44)
AST: 17 IU/L (ref 0–40)
Albumin/Globulin Ratio: 1.7 (ref 1.2–2.2)
Albumin: 4.1 g/dL (ref 4.0–5.0)
Alkaline Phosphatase: 117 IU/L (ref 39–117)
BUN/Creatinine Ratio: 14 (ref 9–20)
BUN: 13 mg/dL (ref 6–24)
Bilirubin Total: 0.4 mg/dL (ref 0.0–1.2)
CO2: 25 mmol/L (ref 20–29)
Calcium: 9.5 mg/dL (ref 8.7–10.2)
Chloride: 101 mmol/L (ref 96–106)
Creatinine, Ser: 0.91 mg/dL (ref 0.76–1.27)
GFR calc Af Amer: 117 mL/min/{1.73_m2} (ref >59)
GFR calc non Af Amer: 101 mL/min/{1.73_m2} (ref >59)
Globulin, Total: 2.4 g/dL (ref 1.5–4.5)
Glucose: 108 mg/dL — ABNORMAL HIGH (ref 65–99)
Potassium: 4.6 mmol/L (ref 3.5–5.2)
Sodium: 138 mmol/L (ref 134–144)
Total Protein: 6.5 g/dL (ref 6.0–8.5)

## 2019-01-17 LAB — CBC WITH DIFFERENTIAL/PLATELET
Basophils Absolute: 0 10*3/uL (ref 0.0–0.2)
Basos: 0 %
EOS (ABSOLUTE): 0.1 10*3/uL (ref 0.0–0.4)
Eos: 1 %
Hematocrit: 43.8 % (ref 37.5–51.0)
Hemoglobin: 14.6 g/dL (ref 13.0–17.7)
Immature Grans (Abs): 0 10*3/uL (ref 0.0–0.1)
Immature Granulocytes: 0 %
Lymphocytes Absolute: 2.6 10*3/uL (ref 0.7–3.1)
Lymphs: 36 %
MCH: 29.1 pg (ref 26.6–33.0)
MCHC: 33.3 g/dL (ref 31.5–35.7)
MCV: 87 fL (ref 79–97)
Monocytes Absolute: 0.5 10*3/uL (ref 0.1–0.9)
Monocytes: 7 %
Neutrophils Absolute: 3.9 10*3/uL (ref 1.4–7.0)
Neutrophils: 56 %
Platelets: 279 10*3/uL (ref 150–450)
RBC: 5.01 x10E6/uL (ref 4.14–5.80)
RDW: 13 % (ref 11.6–15.4)
WBC: 7.2 10*3/uL (ref 3.4–10.8)

## 2019-01-17 LAB — LIPID PANEL
Chol/HDL Ratio: 4.1 ratio (ref 0.0–5.0)
Cholesterol, Total: 171 mg/dL (ref 100–199)
HDL: 42 mg/dL (ref >39)
LDL Calculated: 93 mg/dL (ref 0–99)
Triglycerides: 178 mg/dL — ABNORMAL HIGH (ref 0–149)
VLDL Cholesterol Cal: 36 mg/dL (ref 5–40)

## 2019-01-17 LAB — TSH: TSH: 2.91 u[IU]/mL (ref 0.450–4.500)

## 2019-01-17 MED ORDER — OMEGA-3-ACID ETHYL ESTERS 1 G PO CAPS: 2.0000 g | ORAL_CAPSULE | Freq: Every day | ORAL | 3 refills | Status: DC

## 2019-01-17 NOTE — Telephone Encounter (Signed)
-----   Message from Dorothy Spark, MD sent at 01/17/2019 12:50 PM EDT ----- Normal CBC, CMP, TSH, LDL, stable TG

## 2019-01-17 NOTE — Telephone Encounter (Signed)
Patient was calling to discuss his recent lab results.

## 2019-01-17 NOTE — Telephone Encounter (Signed)
Pt has been notified of lab results and is agreeable to plan of care to start Lovaza. Rx Lovaza 2 gm daily has been sent in to [facility]. Pt asked if we could send in his Losartan, lasix and Atorvastatin. I did tell the pt since he is seeing Dr. Meda Coffee 7/16 it is better to hold off on those refills incase she makes any dose changes. Advised pt if no changes just ask for refills to be sent in. Pt thanked me for the call . Patient notified of result.  Please refer to phone note from today for complete details.   Julaine Hua, [facility] 01/17/2019 5:37 PM

## 2019-01-18 NOTE — Telephone Encounter (Signed)
Pt was not calling back about his lab results he was calling back to confirm his virtual visit with Dr Meda Coffee scheduled for tomorrow 7/16 at 0800.  Called pt earlier to remind him of this and he was calling me back to inform me that he will be attending this appt.  Pt appreciative for the call back and we will see him virtually as planned.

## 2019-01-19 ENCOUNTER — Telehealth (INDEPENDENT_AMBULATORY_CARE_PROVIDER_SITE_OTHER): Payer: 59 | Admitting: Cardiology

## 2019-01-19 ENCOUNTER — Encounter: Payer: Self-pay | Admitting: Cardiology

## 2019-01-19 ENCOUNTER — Other Ambulatory Visit: Payer: Self-pay

## 2019-01-19 VITALS — Wt >= 6400 oz

## 2019-01-19 DIAGNOSIS — Z8249 Family history of ischemic heart disease and other diseases of the circulatory system: Secondary | ICD-10-CM

## 2019-01-19 DIAGNOSIS — I214 Non-ST elevation (NSTEMI) myocardial infarction: Secondary | ICD-10-CM | POA: Diagnosis not present

## 2019-01-19 DIAGNOSIS — I10 Essential (primary) hypertension: Secondary | ICD-10-CM

## 2019-01-19 DIAGNOSIS — E785 Hyperlipidemia, unspecified: Secondary | ICD-10-CM

## 2019-01-19 MED ORDER — NITROGLYCERIN 0.4 MG SL SUBL: 0.4000 mg | SUBLINGUAL_TABLET | SUBLINGUAL | 3 refills | Status: DC | PRN

## 2019-01-19 NOTE — Patient Instructions (Signed)
Medication Instructions:   Your physician recommends that you continue on your current medications as directed. Please refer to the Current Medication list given to you today.  If you need a refill on your cardiac medications before your next appointment, please call your pharmacy.    Lab work:  IN ONE YEAR, PRIOR TO YOUR ONE YEAR FOLLOW-UP APPOINTMENT WITH DR NELSON--WE WILL CHECK CMET, CBC W DIFF, TSH, PRO-BNP, AND LIPIDS--PLEASE COME FASTING TO THIS LAB APPOINTMENT.  WE WILL CALL YOU CLOSER TO THIS TIME FRAME TO ARRANGE THIS APPOINTMENT.  If you have labs (blood work) drawn today and your tests are completely normal, you will receive your results only by: Marland Kitchen MyChart Message (if you have MyChart) OR . A paper copy in the mail If you have any lab test that is abnormal or we need to change your treatment, we will call you to review the results.    Follow-Up: At [facility], you and your health needs are our priority.  As part of our continuing mission to provide you with exceptional heart care, we have created designated Provider Care Teams.  These Care Teams include your primary Cardiologist (physician) and Advanced Practice Providers (APPs -  Physician Assistants and Nurse Practitioners) who all work together to provide you with the care you need, when you need it.  Your physician wants you to follow-up in: [place] will receive a reminder letter in the mail two months in advance. If you don't receive a letter, please call our office to schedule the follow-up appointment. YOU WILL NEED YOUR LAB APPOINTMENT ARRANGED A FEW DAYS PRIOR TO THIS APPOINTMENT PER DR NELSON.   WE HAVE REFERRED YOU TO OUR SOCIAL WORKER TO RECEIVE A FREE BP CUFF TO BE MAILED TO YOUR HOME ADDRESS.  IF THIS IS STILL APPLICABLE, SHE WILL REACH OUT TO YOU AND MAIL THIS TO YOUR HOME ADDRESS.

## 2019-01-19 NOTE — Progress Notes (Signed)
Cardiology Office Note    Date:  01/19/2019   ID:  Jacob Hunt, DOB 1971/02/08, MRN [id number]  PCP:  Alroy Dust, Carlean Jews.Marlou Sa, MD  Cardiologist: Dr. Meda Coffee  Chief complain: 1 year follow-up  History of Present Illness:  Jacob Hunt is a 48 y.o. male with history of normal coronary arteries 07/2015, morbid obesity BMI 32, HLD, family history of premature CAD. He was admitted to [facility] on 03/15/2017 with chest pain that woke him from sleep. Troponins rose to 6.29. He underwent cardiac catheterization demonstrated widely patent coronary arteries with mild ectasia of the proximal LAD and minimal nonobstructive disease involving the proximal RCA unchanged from 2017. Had normal LV function. Etiology of troponins not totally clear. CTA was negative for PE. Medical therapy for blood pressure control and statins was recommended. Losartan HCTZ were added. Needs follow-up bmet. Patient is scheduled for sleep apnea test as well as a 30 day monitor to rule out any underlying tachyarrhythmia that could have precipitated this event. He has been treated for lower extremity edema with lasix.  08/02/2017 - 3 months follow-up, the patient states that Lasix but his lower extremity edema significantly. However in December he injured his back and was diagnosed with a herniated disc. He was bedbound for months unable to work and again 10 pounds. His now undergoing physical therapy and also going to gym 3 times a week, determined to loose weight. No more chest pain shortness of breath dizziness.  12/13/17 - 4 months follow up, no admission since the last visit, he has been feeling well, he denies any chest pain shortness of breath no orthopnea proximal nocturnal dyspnea.  He developed lower extremity edema only when he forgets to take his Lasix.  No dizziness or syncope.  He is trying to go to gym once a week.  01/19/2019 - 1 year follow up, patient is back at work, he is driving a tractor, he has not been having any issues,  denies chest pain or shortness of breath, his lower extremity edema is well controlled with Lasix, he continues to gain weight, he started to drink sodas again.  He does not exercise.  He was supposed to be evaluated for sleep apnea but his insurance did not approve.  Past Medical History:  Diagnosis Date  . Essential hypertension   . GERD (gastroesophageal reflux disease)   . Lumbar herniated disc   . Morbid obesity ([place])    Past Surgical History:  Procedure Laterality Date  . CARDIAC CATHETERIZATION N/A 08/02/2015   Procedure: Left Heart Cath and Coronary Angiography;  Surgeon: Peter M Martinique, MD;  Location: [place] [facility];  Service: Cardiovascular;  Laterality: N/A;  . CARDIAC CATHETERIZATION  03/15/2017  . LEFT HEART CATH AND CORONARY ANGIOGRAPHY N/A 03/15/2017   Procedure: LEFT HEART CATH AND CORONARY ANGIOGRAPHY;  Surgeon: Sherren Mocha, MD;  Location: [place] [facility];  Service: Cardiovascular;  Laterality: N/A;    Current Medications: Current Meds  Medication Sig  . aspirin EC 81 MG tablet Take 1 tablet (81 mg total) by mouth daily.  Marland Kitchen atorvastatin (LIPITOR) 80 MG tablet Take 1 tablet (80 mg total) by mouth daily at 6 PM. Please keep upcoming appt with Dr. Meda Coffee for July before anymore refills. Thank you  . furosemide (LASIX) 40 MG tablet Take 1 tablet (40 mg total) by mouth daily. Please make annual appt with Dr. Meda Coffee for future refills. Thank you  . losartan (COZAAR) 50 MG tablet TAKE 1 TABLET BY MOUTH ONCE  DAILY  . omega-3 acid ethyl esters (LOVAZA) 1 g capsule Take 2 capsules (2 g total) by mouth daily.    Allergies:   Patient has no known allergies.   Social History   Socioeconomic History  . Marital status: Married    Spouse name: Not on file  . Number of children: Not on file  . Years of education: Not on file  . Highest education level: Not on file  Occupational History  . Not on file  Social Needs  . Financial resource strain: Not on file  . Food  insecurity    Worry: Not on file    Inability: Not on file  . Transportation needs    Medical: Not on file    Non-medical: Not on file  Tobacco Use  . Smoking status: Never Smoker  . Smokeless tobacco: Never Used  Substance and Sexual Activity  . Alcohol use: No    Alcohol/week: 0.0 standard drinks  . Drug use: No  . Sexual activity: Yes  Lifestyle  . Physical activity    Days per week: Not on file    Minutes per session: Not on file  . Stress: Not on file  Relationships  . Social Musicianconnections    Talks on phone: Not on file    Gets together: Not on file    Attends religious service: Not on file    Active member of club or organization: Not on file    Attends meetings of clubs or organizations: Not on file    Relationship status: Not on file  Other Topics Concern  . Not on file  Social History Narrative  . Not on file     Family History:  The patient's   family history includes CAD in his brother, father, and mother; Cancer in his maternal aunt, maternal grandfather, and mother; Coronary artery disease in his maternal grandfather; Diabetes in his brother, father, and mother; Hypertension in his brother, father, and mother; Other in an other family member.   ROS:   Please see the history of present illness.    Review of Systems  HENT: Negative.   Cardiovascular: Positive for chest pain and leg swelling.  Respiratory: Positive for cough.   Endocrine: Negative.   Hematologic/Lymphatic: Negative.   Musculoskeletal: Negative.   Gastrointestinal: Negative.   Genitourinary: Negative.   Neurological: Positive for weakness.   All other systems reviewed and are negative.   PHYSICAL EXAM:   VS:  Wt (!) 418 lb (189.6 kg)   BMI 65.47 kg/m   Physical Exam  GEN: Well nourished, well developed, in no acute distress  HEENT: normal  Neck: no JVD, carotid bruits, or masses Cardiac:RRR; no murmurs, rubs, or gallops  Respiratory:  clear to auscultation bilaterally, normal work of  breathing GI: soft, nontender, nondistended, + BS Ext: without cyanosis, clubbing, or edema, Good distal pulses bilaterally MS: no deformity or atrophy  Skin: warm and dry, no rash Neuro:  Alert and Oriented x 3, Strength and sensation are intact Psych: euthymic mood, full affect  Wt Readings from Last 3 Encounters:  01/19/19 (!) 418 lb (189.6 kg)  12/13/17 (!) 412 lb 12.8 oz (187.2 kg)  08/02/17 (!) 414 lb (187.8 kg)     Studies/Labs Reviewed:   EKG:  EKG is not ordered today.    Recent Labs: 01/16/2019: ALT 18; BUN 13; Creatinine, Ser 0.91; Hemoglobin 14.6; Platelets 279; Potassium 4.6; Sodium 138; TSH 2.910   Lipid Panel    Component Value Date/Time  CHOL 171 01/16/2019 0839   TRIG 178 (H) 01/16/2019 0839   HDL 42 01/16/2019 0839   CHOLHDL 4.1 01/16/2019 0839   CHOLHDL 5.2 03/16/2017 0132   VLDL 47 (H) 03/16/2017 0132   LDLCALC 93 01/16/2019 0839    Additional studies/ records that were reviewed today include:  2Decho 03/16/17 Study Conclusions   - Left ventricle: The cavity size was normal. There was mild focal   basal hypertrophy of the septum. Systolic function was normal.   The estimated ejection fraction was in the range of 55% to 60%.   Wall motion was normal; there were no regional wall motion   abnormalities. Doppler parameters are consistent with abnormal   left ventricular relaxation (grade 1 diastolic dysfunction).   Impressions: - Technically diffiicult; definity used; normal LV systolic   function; mild diastolic dysfunction  CTA 03/16/17 IMPRESSION: 1. No evidence pulmonary embolus. 2. No active cardiopulmonary disease.   Cardiac Cath 03/15/17 Conclusion  1. Widely patent coronary arteries with mild ectasia of the proximal LAD and minimal nonobstructive disease involving the proximal RCA, unchanged from 2017 cath 2. Normal LV systolic function   Suspect noncardiac symptoms    ASSESSMENT:    1. NSTEMI (non-ST elevated myocardial infarction)  (HCC)   2. Hyperlipidemia, unspecified hyperlipidemia type   3. Essential hypertension   4. Family history of premature CAD   5. Morbid obesity (HCC)      PLAN:  In order of problems listed above:  1. Chronic diastolic CHF -well controlled with Lasix, he is also advised about low-sodium diet, creatinine and electrolytes are normal..  2. H/o elevated troponins with chest pain unclear etiology, cardiac catheterization with normal coronary arteries and unchanged from cath 2017. Echocardiogram shows normal LV EF with no regional wall motion abnormalities.  3. Hypertension, he does not have a blood pressure cuff, will continue current regimen and try to arrange for free blood pressure cuff.  4. Morbid obesity  -he has poor understanding, continues to drink sodas, he is again explained healthy diet and necessity to exercise, he was previously reluctant about bariatric surgery.  5. Hyperlipidemia  -on atorvastatin 80 mg daily, he is tolerating it well, LFTs are normal.  LDL at goal, triglycerides are elevated, he is advised to cut on sugars and sugary drinks.  Medication Adjustments/Labs and Tests Ordered: Current medicines are reviewed at length with the patient today.  Concerns regarding medicines are outlined above.  Medication changes, Labs and Tests ordered today are listed in the Patient Instructions below. There are no Patient Instructions on file for this visit.   Signed, Tobias AlexanderKatarina Maysin Carstens, MD  01/19/2019 8:29 AM    [facility] [address], [place], [place]  [address] Phone: [phone]; Fax: [phone]

## 2019-01-24 ENCOUNTER — Telehealth: Payer: Self-pay | Admitting: *Deleted

## 2019-01-24 ENCOUNTER — Telehealth: Payer: Self-pay | Admitting: Licensed Clinical Social Worker

## 2019-01-24 NOTE — Telephone Encounter (Signed)
-----   Message from Louann Liv,  sent at 01/24/2019  4:17 PM EDT ----- Regarding: RE: FREE BP CUFF? Hi, Sorry I am so late in responding but I was on PAL. Yes, I will order now for this patient and have it shipped to the home. Thanks for the referral. Kennyth Lose ----- Message ----- From: Nuala Alpha, LPN Sent: 1/32/4401   8:38 AM EDT To: Louann Liv, LCSW Subject: FREE BP CUFF?                                  Dr. Meda Coffee did a virtual visit on this pt this morning and he had no BP cuff to obtain vitals.  He is high risk in this pandemic. Dr. Meda Coffee wanted me to reach out to you to see if you were still offering to mail free BP cuffs to virtual visit pts? If so would you be so kind and mail this pt one? Really appreciate all you do for our patients.  Take care and hope you are doing well.  Thanks,  [facility]

## 2019-01-24 NOTE — Telephone Encounter (Signed)
CSW referred to assist patient with obtaining a BP cuff. CSW contacted patient to inform cuff will be delivered to home. Message left. CSW available as needed. Jackie Girard Koontz, LCSW, CCSW-MCS [phone]  

## 2019-02-08 ENCOUNTER — Other Ambulatory Visit: Payer: Self-pay | Admitting: Cardiology

## 2019-02-08 DIAGNOSIS — E785 Hyperlipidemia, unspecified: Secondary | ICD-10-CM

## 2019-02-08 DIAGNOSIS — I214 Non-ST elevation (NSTEMI) myocardial infarction: Secondary | ICD-10-CM

## 2019-02-08 DIAGNOSIS — I5033 Acute on chronic diastolic (congestive) heart failure: Secondary | ICD-10-CM

## 2019-02-08 DIAGNOSIS — I10 Essential (primary) hypertension: Secondary | ICD-10-CM

## 2019-02-08 DIAGNOSIS — Z8249 Family history of ischemic heart disease and other diseases of the circulatory system: Secondary | ICD-10-CM

## 2019-02-08 MED ORDER — FUROSEMIDE 40 MG PO TABS: 40.0000 mg | ORAL_TABLET | Freq: Every day | ORAL | 3 refills | Status: DC

## 2019-02-08 MED ORDER — LOSARTAN POTASSIUM 50 MG PO TABS: 50.0000 mg | ORAL_TABLET | Freq: Every day | ORAL | 3 refills | Status: DC

## 2019-02-08 MED ORDER — ATORVASTATIN CALCIUM 80 MG PO TABS: 80.0000 mg | ORAL_TABLET | Freq: Every day | ORAL | 3 refills | Status: DC

## 2019-02-08 MED ORDER — NITROGLYCERIN 0.4 MG SL SUBL: 0.4000 mg | SUBLINGUAL_TABLET | SUBLINGUAL | 3 refills | Status: DC | PRN

## 2019-02-08 NOTE — Telephone Encounter (Signed)
Pt's medications was sent to pt's pharmacy as requested. Confirmation received.  

## 2019-02-13 ENCOUNTER — Telehealth: Payer: Self-pay | Admitting: Cardiology

## 2019-02-13 NOTE — Telephone Encounter (Signed)
Spoke with patient and made him aware that [facility] for one year supplies as requested. Made him aware that [facility] if he request that it be done. He will call [facility] to request this.

## 2019-02-13 NOTE — Telephone Encounter (Signed)
New Message     *STAT* If patient is at the pharmacy, call can be transferred to refill team.   1. Which medications need to be refilled? (please list name of each medication and dose if known) Furosemide 40mg , Atorvastatin 80mg , Losartan 50mg   2. Which pharmacy/location (including street and city if local pharmacy) is medication to be sent to? [facility] [address]   3. Do they need a 30 day or 90 day supply? 90 day supply

## 2019-07-09 IMAGING — CT CT ANGIO CHEST
2 of 8 series · 18 of 46 positions shown · IV contrast (APPLIED)
Comparison: None.

CLINICAL DATA: Centralized chest pain began yesterday. No shortness
of breath.

EXAM:
CT ANGIOGRAPHY CHEST WITH CONTRAST
TECHNIQUE: Multidetector CT imaging of the chest was performed using the
standard protocol during bolus administration of intravenous
contrast. Multiplanar CT image reconstructions and MIPs were
obtained to evaluate the vascular anatomy.
CONTRAST:  83 mL Isovue 370

[Series 6: thins · axial · 0.87mm/px · z∈[+1289,+1523]mm · 15 of 258 slices shown]
[im 12/258  lung]
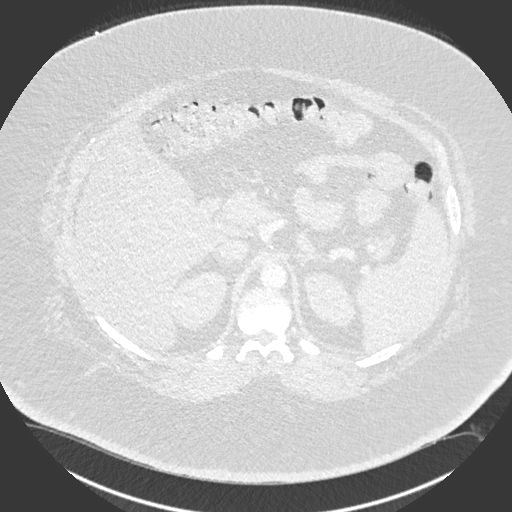
[im 36/258  soft-tissue]
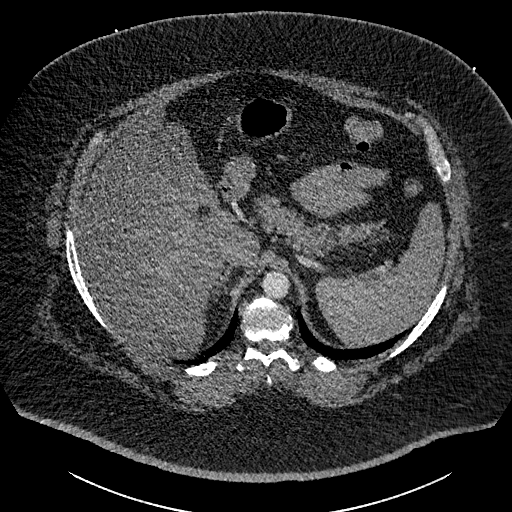
[im 47/258  lung]
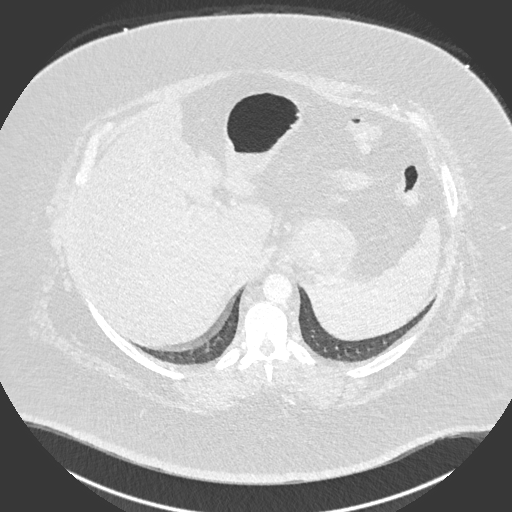
[im 59/258  soft-tissue]
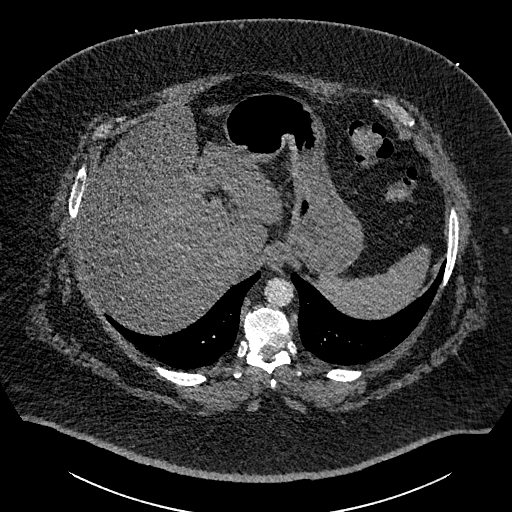
[im 82/258  lung]
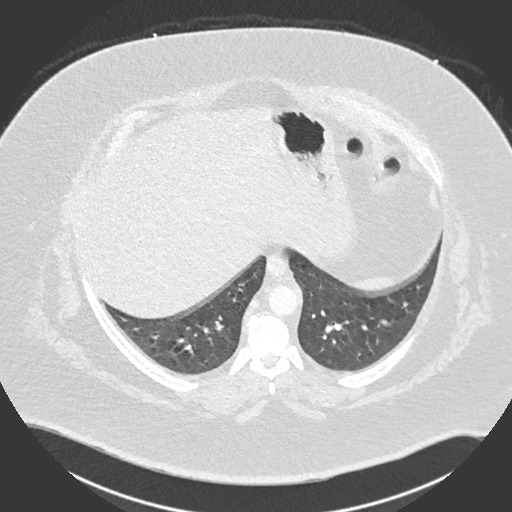
[im 94/258  soft-tissue]
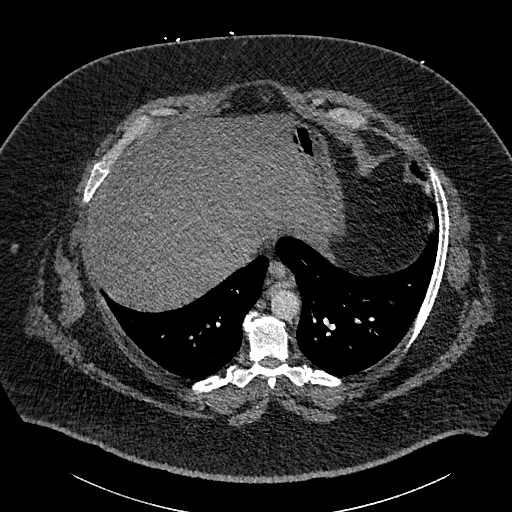
[im 117/258  lung]
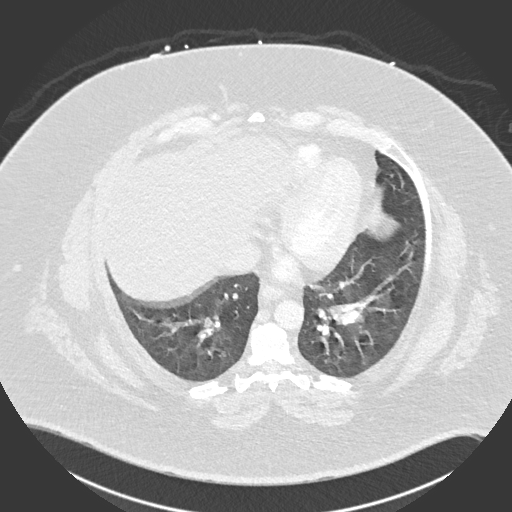
[im 129/258  soft-tissue]
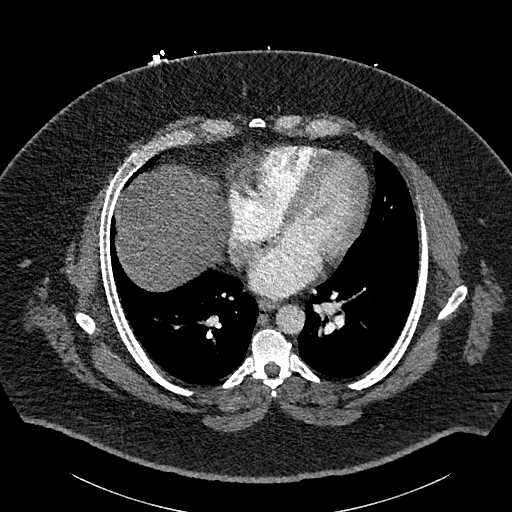
[im 141/258  lung]
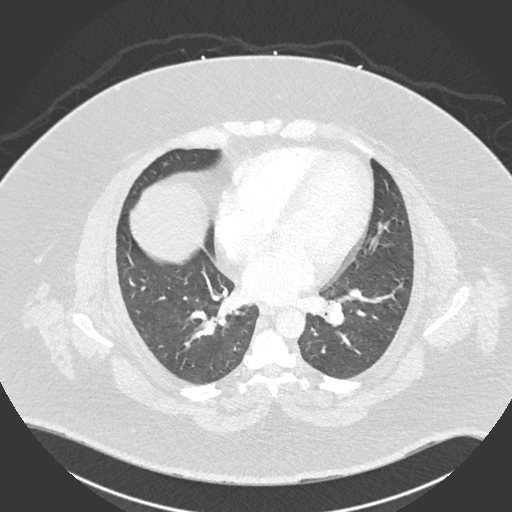
[im 164/258  soft-tissue]
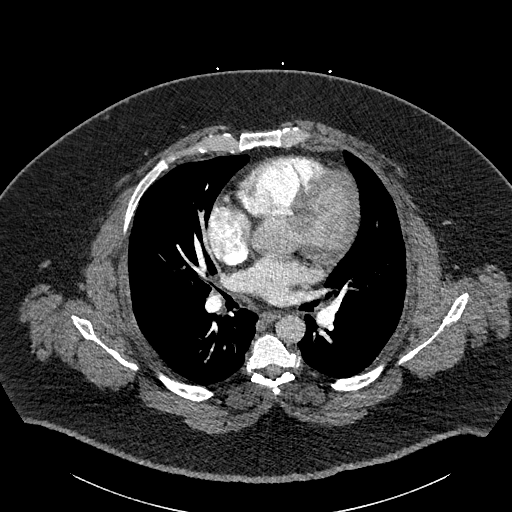
[im 176/258  lung]
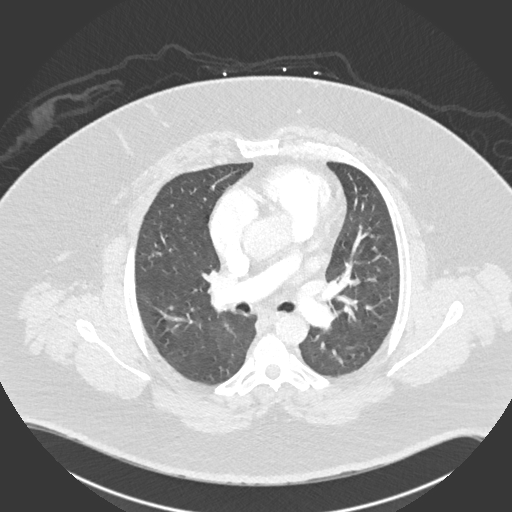
[im 199/258  soft-tissue]
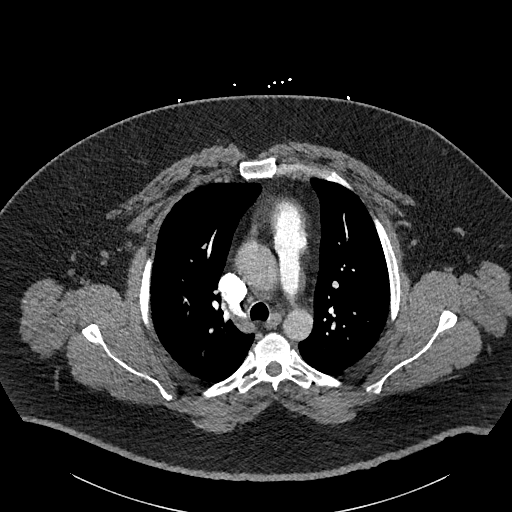
[im 211/258  lung]
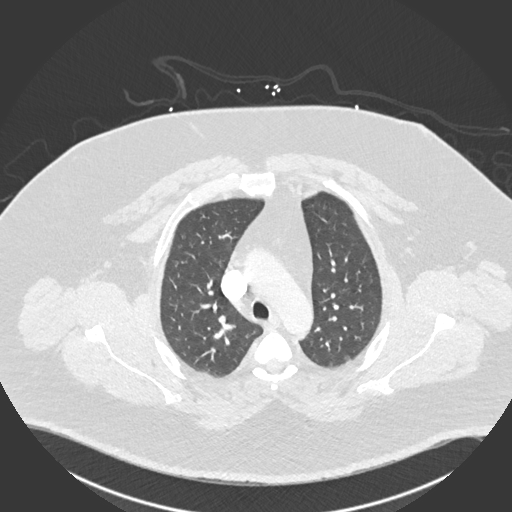
[im 222/258  soft-tissue]
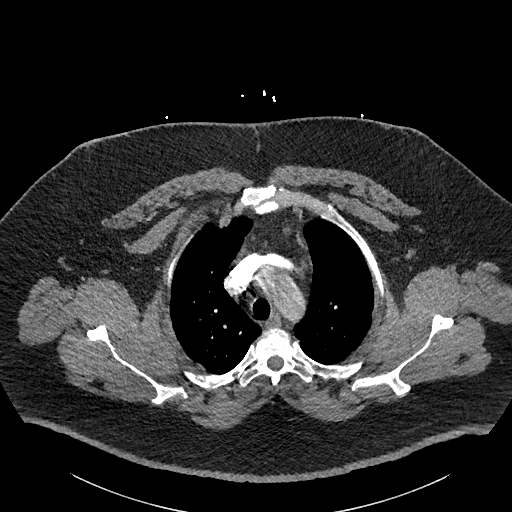
[im 246/258  lung]
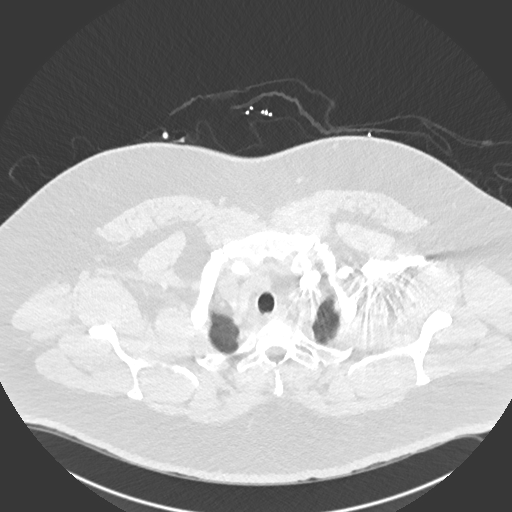

[Series 8: coronal mpr · coronal · 0.53mm/px · 3 of 187 slices shown]
[im 47/187  soft-tissue]
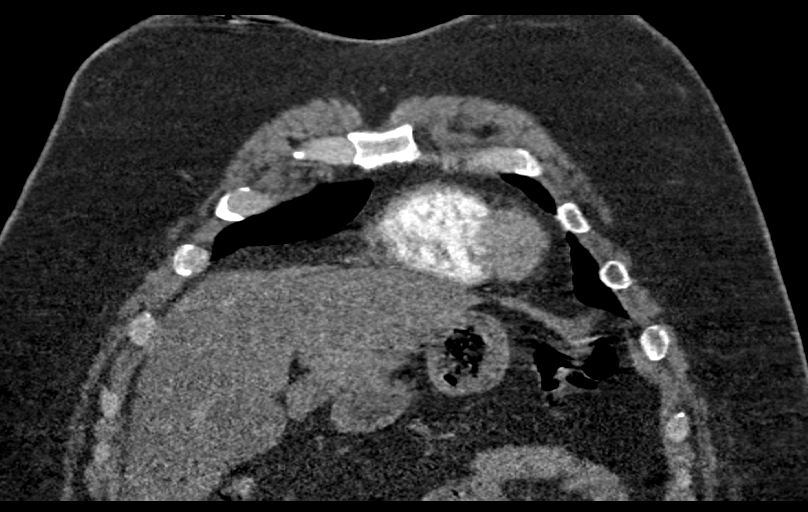
[im 94/187  soft-tissue]
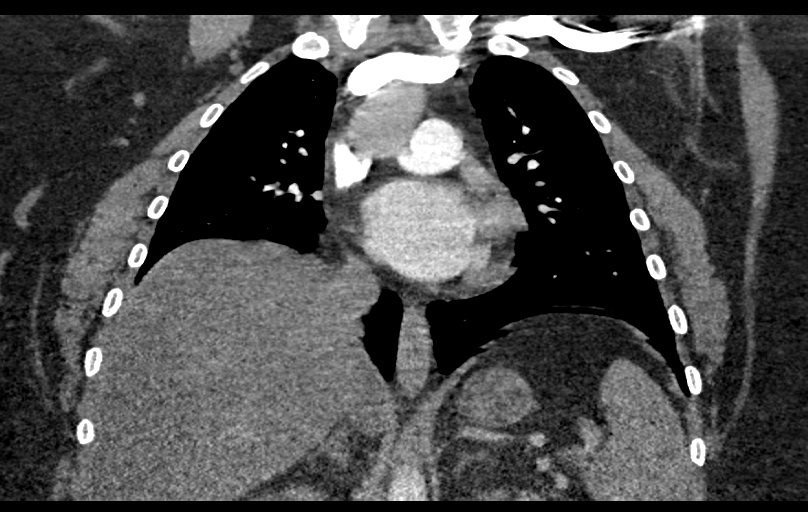
[im 140/187  soft-tissue]
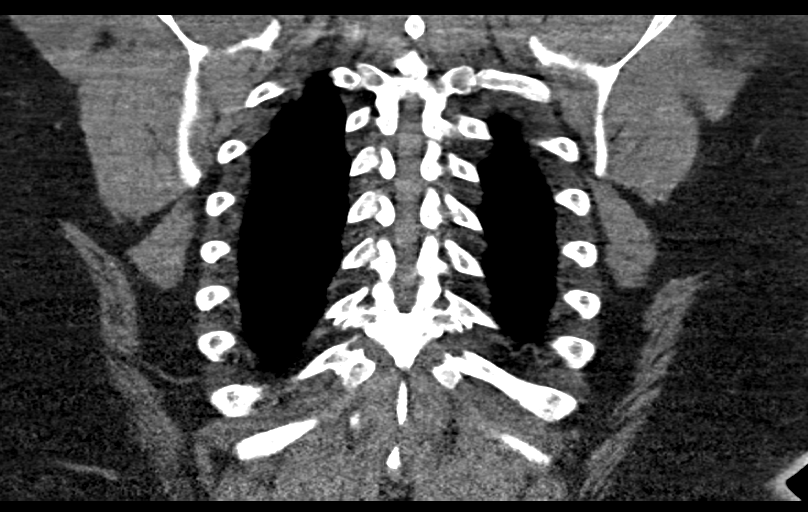

[18 of 46 positions shown; findings below may reference images not displayed]

FINDINGS: Cardiovascular: Satisfactory opacification of the pulmonary arteries
to the segmental level. No evidence of pulmonary embolism. Normal
heart size. No pericardial effusion.

Mediastinum/Nodes: No enlarged mediastinal, hilar, or axillary lymph
nodes. Thyroid gland, trachea, and esophagus demonstrate no
significant findings.

Lungs/Pleura: Lungs are clear. No pleural effusion or pneumothorax.

Upper Abdomen: No acute abnormality. Diffuse low attenuation of the
liver as can be seen with hepatic steatosis.

Musculoskeletal: No chest wall abnormality. No acute or significant
osseous findings.

Review of the MIP images confirms the above findings.
IMPRESSION: 1. No evidence pulmonary embolus.
2. No active cardiopulmonary disease.

## 2019-07-25 ENCOUNTER — Other Ambulatory Visit: Payer: Self-pay | Admitting: Cardiology

## 2020-02-27 ENCOUNTER — Other Ambulatory Visit: Payer: Self-pay | Admitting: Cardiology

## 2020-02-27 DIAGNOSIS — Z8249 Family history of ischemic heart disease and other diseases of the circulatory system: Secondary | ICD-10-CM

## 2020-02-27 DIAGNOSIS — E785 Hyperlipidemia, unspecified: Secondary | ICD-10-CM

## 2020-02-27 DIAGNOSIS — I5033 Acute on chronic diastolic (congestive) heart failure: Secondary | ICD-10-CM

## 2020-02-27 DIAGNOSIS — I10 Essential (primary) hypertension: Secondary | ICD-10-CM

## 2020-02-27 DIAGNOSIS — I214 Non-ST elevation (NSTEMI) myocardial infarction: Secondary | ICD-10-CM

## 2020-02-27 MED ORDER — FUROSEMIDE 40 MG PO TABS: 40.0000 mg | ORAL_TABLET | Freq: Every day | ORAL | 1 refills | Status: DC

## 2020-02-27 MED ORDER — ATORVASTATIN CALCIUM 80 MG PO TABS
80.0000 mg | ORAL_TABLET | Freq: Every day | ORAL | 1 refills | Status: DC
Start: 2020-02-27 — End: 2020-04-10

## 2020-02-27 MED ORDER — LOSARTAN POTASSIUM 50 MG PO TABS
50.0000 mg | ORAL_TABLET | Freq: Every day | ORAL | 1 refills | Status: DC
Start: 2020-02-27 — End: 2020-04-10

## 2020-02-27 MED ORDER — NITROGLYCERIN 0.4 MG SL SUBL: 0.4000 mg | SUBLINGUAL_TABLET | SUBLINGUAL | 1 refills | Status: DC | PRN

## 2020-02-27 MED ORDER — OMEGA-3-ACID ETHYL ESTERS 1 G PO CAPS: 2.0000 | ORAL_CAPSULE | Freq: Every day | ORAL | 1 refills | Status: DC

## 2020-02-27 NOTE — Telephone Encounter (Signed)
°*  STAT* If patient is at the pharmacy, call can be transferred to refill team.   1. Which medications need to be refilled? (please list name of each medication and dose if known)  losartan (COZAAR) 50 MG tablet atorvastatin (LIPITOR) 80 MG tablet omega-3 acid ethyl esters (LOVAZA) 1 g capsule nitroGLYCERIN (NITROSTAT) 0.4 MG SL tablet furosemide (LASIX) 40 MG tablet   2. Which pharmacy/location (including street and city if local pharmacy) is medication to be sent to? [facility] - [place], [place] - [address]  3. Do they need a 30 day or 90 day supply? 30 with refills  Pt is scheduled to see Jacolyn Reedy 04/10/20

## 2020-02-27 NOTE — Telephone Encounter (Signed)
Pt's medications were sent to pt's pharmacy as requested. Confirmation received.  

## 2020-04-05 ENCOUNTER — Other Ambulatory Visit: Payer: 59

## 2020-04-08 ENCOUNTER — Other Ambulatory Visit: Payer: Self-pay | Admitting: *Deleted

## 2020-04-08 ENCOUNTER — Other Ambulatory Visit: Payer: Self-pay

## 2020-04-08 ENCOUNTER — Other Ambulatory Visit: Payer: 59 | Admitting: *Deleted

## 2020-04-08 ENCOUNTER — Other Ambulatory Visit: Payer: Self-pay | Admitting: Cardiology

## 2020-04-08 DIAGNOSIS — E785 Hyperlipidemia, unspecified: Secondary | ICD-10-CM

## 2020-04-08 DIAGNOSIS — Z8249 Family history of ischemic heart disease and other diseases of the circulatory system: Secondary | ICD-10-CM

## 2020-04-08 DIAGNOSIS — I214 Non-ST elevation (NSTEMI) myocardial infarction: Secondary | ICD-10-CM

## 2020-04-08 DIAGNOSIS — I10 Essential (primary) hypertension: Secondary | ICD-10-CM

## 2020-04-08 LAB — CBC
Hematocrit: 41.4 % (ref 37.5–51.0)
Hemoglobin: 13.8 g/dL (ref 13.0–17.7)
MCH: 29.4 pg (ref 26.6–33.0)
MCHC: 33.3 g/dL (ref 31.5–35.7)
MCV: 88 fL (ref 79–97)
Platelets: 251 10*3/uL (ref 150–450)
RBC: 4.7 x10E6/uL (ref 4.14–5.80)
RDW: 14.2 % (ref 11.6–15.4)
WBC: 7 10*3/uL (ref 3.4–10.8)

## 2020-04-08 NOTE — Progress Notes (Signed)
Previous labs ordered on this pt, per Dr. Delton See, was for him to have CMET, CBC, TSH, PRO-BNP, and LIPIDS. Orders expired and lab tech requested new ones be placed, for the pt is here now getting these drawn.  Orders placed again and pt will have drawn now.  Lab [employer] and Katrina aware.

## 2020-04-08 NOTE — Progress Notes (Signed)
Cardiology Office Note    Date:  04/10/2020   ID:  Jacob Hunt, DOB 08/05/1970, MRN [id number]  PCP:  Clovis Riley, Elbert Ewings.August Saucer, MD  Cardiologist: Tobias Alexander, MD EPS: None  Chief Complaint  Patient presents with  . Follow-up    History of Present Illness:  Jacob Hunt is a 49 y.o. male with history of normal coronary arteries 07/2015, morbid obesity BMI over 70, HLD, family history of premature CAD. He was admitted to [facility] on 03/15/2017 with chest pain that woke him from sleep. Troponins rose to 6.29. He underwent cardiac catheterization demonstrated widely patent coronary arteries with mild ectasia of the proximal LAD and minimal nonobstructive disease involving the proximal RCA unchanged from 2017. Had normal LV function. Etiology of troponins not totally clear. CTA was negative for PE. Medical therapy for blood pressure control and statins was recommended.   Patient last saw Dr. Delton See 01/19/19 at which time he was doing well.  Insurance would not pay for sleep study.  Patient comes in for f/u. No complaints. 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 Has gained 36 lbs since here last. No exercise. Everyone in his family is large. Labs reviewed from 04/08/20 normal. Right leg swells at night. No taking lasix because of leg cramps. Also doesn't drink enough water. Drinks a lot of mountain dew and sweet tea.   Past Medical History:  Diagnosis Date  . Essential hypertension   . GERD (gastroesophageal reflux disease)   . Lumbar herniated disc   . Morbid obesity (HCC)     Past Surgical History:  Procedure Laterality Date  . CARDIAC CATHETERIZATION N/A 08/02/2015   Procedure: Left Heart Cath and Coronary Angiography;  Surgeon: Peter M Swaziland, MD;  Location: [facility] INVASIVE CV LAB;  Service: Cardiovascular;  Laterality: N/A;  . CARDIAC CATHETERIZATION  03/15/2017  . LEFT HEART CATH AND CORONARY ANGIOGRAPHY N/A 03/15/2017   Procedure: LEFT HEART CATH AND CORONARY ANGIOGRAPHY;  Surgeon:  Tonny Bollman, MD;  Location: [facility] INVASIVE CV LAB;  Service: Cardiovascular;  Laterality: N/A;    Current Medications: Current Meds  Medication Sig  . aspirin EC 81 MG tablet Take 1 tablet (81 mg total) by mouth daily.  Marland Kitchen atorvastatin (LIPITOR) 80 MG tablet Take 1 tablet (80 mg total) by mouth daily at 6 PM. Please keep upcoming appt in October before anymore refills. Thank you  . Cholecalciferol (VITAMIN D) 125 MCG (5000 UT) CAPS Take by mouth.  . furosemide (LASIX) 40 MG tablet Take 1 tablet (40 mg total) by mouth daily. Please keep upcoming appt in October before anymore refills. Thank you  . losartan (COZAAR) 50 MG tablet Take 1 tablet (50 mg total) by mouth daily. Please keep upcoming appt in October before anymore refills. Thank you  . nitroGLYCERIN (NITROSTAT) 0.4 MG SL tablet Place 1 tablet (0.4 mg total) under the tongue every 5 (five) minutes x 3 doses as needed for chest pain.  Marland Kitchen omega-3 acid ethyl esters (LOVAZA) 1 g capsule Take 2 capsules (2 g total) by mouth daily. Please keep upcoming appt in October before anymore refills. Thank you     Allergies:   Patient has no known allergies.   Social History   Socioeconomic History  . Marital status: Married    Spouse name: Not on file  . Number of children: Not on file  . Years of education: Not on file  . Highest education level: Not on file  Occupational History  . Not on file  Tobacco  Use  . Smoking status: Never Smoker  . Smokeless tobacco: Never Used  Vaping Use  . Vaping Use: Never used  Substance and Sexual Activity  . Alcohol use: No    Alcohol/week: 0.0 standard drinks  . Drug use: No  . Sexual activity: Yes  Other Topics Concern  . Not on file  Social History Narrative  . Not on file   Social Determinants of Health   Financial Resource Strain:   . Difficulty of Paying Living Expenses: Not on file  Food Insecurity:   . Worried About Programme researcher, broadcasting/film/video in the Last Year: Not on file  . Ran Out of Food  in the Last Year: Not on file  Transportation Needs:   . Lack of Transportation (Medical): Not on file  . Lack of Transportation (Non-Medical): Not on file  Physical Activity:   . Days of Exercise per Week: Not on file  . Minutes of Exercise per Session: Not on file  Stress:   . Feeling of Stress : Not on file  Social Connections:   . Frequency of Communication with Friends and Family: Not on file  . Frequency of Social Gatherings with Friends and Family: Not on file  . Attends Religious Services: Not on file  . Active Member of Clubs or Organizations: Not on file  . Attends Banker Meetings: Not on file  . Marital Status: Not on file     Family History:  The patient's family history includes CAD in his brother, father, and mother; Cancer in his maternal aunt, maternal grandfather, and mother; Coronary artery disease in his maternal grandfather; Diabetes in his brother, father, and mother; Hypertension in his brother, father, and mother; Other in an other family member.   ROS:   Please see the history of present illness.    ROS All other systems reviewed and are negative.   PHYSICAL EXAM:   VS:  BP 120/76   Pulse 76   Ht 5\' 7"  (1.702 m)   Wt (!) 454 lb 12.8 oz (206.3 kg)   SpO2 97%   BMI 71.23 kg/m   Physical Exam  GEN: Obese, in no acute distress  Neck: no JVD, carotid bruits, or masses Cardiac:RRR; no murmurs, rubs, or gallops  Respiratory:  clear to auscultation bilaterally, normal work of breathing GI: soft, nontender, nondistended, + BS edema right>left small cystlike ulcerations on the top of his right lower leg Neuro:  Alert and Oriented x 3 Psych: euthymic mood, full affect  Wt Readings from Last 3 Encounters:  04/10/20 (!) 454 lb 12.8 oz (206.3 kg)  01/19/19 (!) 418 lb (189.6 kg)  12/13/17 (!) 412 lb 12.8 oz (187.2 kg)      Studies/Labs Reviewed:   EKG:  EKG is  ordered today.  The ekg ordered today demonstrates NSR  Recent  Labs: 04/08/2020: ALT 21; BUN 13; Creatinine, Ser 0.88; NT-Pro BNP 70; Potassium 4.8; Sodium 138; TSH 3.350   Lipid Panel    Component Value Date/Time   CHOL 149 04/08/2020 0747   TRIG 127 04/08/2020 0747   HDL 48 04/08/2020 0747   CHOLHDL 3.1 04/08/2020 0747   CHOLHDL 5.2 03/16/2017 0132   VLDL 47 (H) 03/16/2017 0132   LDLCALC 78 04/08/2020 0747    Additional studies/ records that were reviewed today include:  2Decho 03/16/17 Study Conclusions   - Left ventricle: The cavity size was normal. There was mild focal   basal hypertrophy of the septum. Systolic function was  normal.   The estimated ejection fraction was in the range of 55% to 60%.   Wall motion was normal; there were no regional wall motion   abnormalities. Doppler parameters are consistent with abnormal   left ventricular relaxation (grade 1 diastolic dysfunction).   Impressions: - Technically diffiicult; definity used; normal LV systolic   function; mild diastolic dysfunction   CTA 03/16/17 IMPRESSION: 1. No evidence pulmonary embolus. 2. No active cardiopulmonary disease.   Cardiac Cath 03/15/17 Conclusion  1. Widely patent coronary arteries with mild ectasia of the proximal LAD and minimal nonobstructive disease involving the proximal RCA, unchanged from 2017 cath 2. Normal LV systolic function   Suspect noncardiac symptoms        ASSESSMENT:    1. History of non-ST elevation myocardial infarction (NSTEMI)   2. Chronic diastolic CHF (congestive heart failure) (HCC)   3. Essential hypertension   4. Hyperlipidemia, unspecified hyperlipidemia type   5. Morbid obesity (HCC)   6. Lower extremity edema   7. Family history of premature CAD   8. Acute on chronic diastolic CHF (congestive heart failure) (HCC)      PLAN:  In order of problems listed above:  NSTEMI 2017 with elevated troponins and chest pain but cardiac cath with normal coronary arteries.  No change on follow-up cath in 2017.  Echo normal  LVEF no regional wall motion abnormalities-no chest pain or cardiac symptoms.  Very sedentary and his BMI is 71.  Weight loss essential to his overall health.  Chronic diastolic with right lower extremity edema worse as the day goes on.  Has not been taking his Lasix because it causes leg cramps.  Recent blood work potassium was normal.  Also has some ulcerations on the top of his right leg.  Will order venous Dopplers to rule out blood clot.  Hypertension blood pressure controlled with losartan  Hyperlipidemia LDL 78 on Lipitor and Lovaza  Morbid obesity has gained 36 pounds in the past year.  BMI is now 71.  Long discussion about the importance of weight loss to his overall health.  Will refer to medical weight loss center.  Begin regular exercise.    Medication Adjustments/Labs and Tests Ordered: Current medicines are reviewed at length with the patient today.  Concerns regarding medicines are outlined above.  Medication changes, Labs and Tests ordered today are listed in the Patient Instructions below. There are no Patient Instructions on file for this visit.   Elson Clan, PA-C  04/10/2020 8:43 AM    [facility] [address] [place], [place], [place]  [address] Phone: [phone]; Fax: [phone]

## 2020-04-09 LAB — LIPID PANEL
Chol/HDL Ratio: 3.1 ratio (ref 0.0–5.0)
Cholesterol, Total: 149 mg/dL (ref 100–199)
HDL: 48 mg/dL (ref >39)
LDL Chol Calc (NIH): 78 mg/dL (ref 0–99)
Triglycerides: 127 mg/dL (ref 0–149)
VLDL Cholesterol Cal: 23 mg/dL (ref 5–40)

## 2020-04-09 LAB — COMPREHENSIVE METABOLIC PANEL
ALT: 21 IU/L (ref 0–44)
AST: 21 IU/L (ref 0–40)
Albumin/Globulin Ratio: 1.8 (ref 1.2–2.2)
Albumin: 4 g/dL (ref 4.0–5.0)
Alkaline Phosphatase: 94 IU/L (ref 44–121)
BUN/Creatinine Ratio: 15 (ref 9–20)
BUN: 13 mg/dL (ref 6–24)
Bilirubin Total: 0.5 mg/dL (ref 0.0–1.2)
CO2: 23 mmol/L (ref 20–29)
Calcium: 9.3 mg/dL (ref 8.7–10.2)
Chloride: 99 mmol/L (ref 96–106)
Creatinine, Ser: 0.88 mg/dL (ref 0.76–1.27)
GFR calc Af Amer: 119 mL/min/{1.73_m2} (ref >59)
GFR calc non Af Amer: 103 mL/min/{1.73_m2} (ref >59)
Globulin, Total: 2.2 g/dL (ref 1.5–4.5)
Glucose: 109 mg/dL — ABNORMAL HIGH (ref 65–99)
Potassium: 4.8 mmol/L (ref 3.5–5.2)
Sodium: 138 mmol/L (ref 134–144)
Total Protein: 6.2 g/dL (ref 6.0–8.5)

## 2020-04-09 LAB — TSH: TSH: 3.35 u[IU]/mL (ref 0.450–4.500)

## 2020-04-09 LAB — PRO B NATRIURETIC PEPTIDE: NT-Pro BNP: 70 pg/mL (ref 0–121)

## 2020-04-10 ENCOUNTER — Encounter: Payer: Self-pay | Admitting: Physician Assistant

## 2020-04-10 ENCOUNTER — Other Ambulatory Visit: Payer: Self-pay

## 2020-04-10 ENCOUNTER — Ambulatory Visit: Payer: 59 | Admitting: Physician Assistant

## 2020-04-10 VITALS — BP 120/76 | HR 76 | Ht 67.0 in | Wt >= 6400 oz

## 2020-04-10 DIAGNOSIS — Z8249 Family history of ischemic heart disease and other diseases of the circulatory system: Secondary | ICD-10-CM

## 2020-04-10 DIAGNOSIS — I252 Old myocardial infarction: Secondary | ICD-10-CM

## 2020-04-10 DIAGNOSIS — I5032 Chronic diastolic (congestive) heart failure: Secondary | ICD-10-CM

## 2020-04-10 DIAGNOSIS — I10 Essential (primary) hypertension: Secondary | ICD-10-CM | POA: Diagnosis not present

## 2020-04-10 DIAGNOSIS — E785 Hyperlipidemia, unspecified: Secondary | ICD-10-CM

## 2020-04-10 DIAGNOSIS — I5033 Acute on chronic diastolic (congestive) heart failure: Secondary | ICD-10-CM

## 2020-04-10 DIAGNOSIS — R600 Localized edema: Secondary | ICD-10-CM

## 2020-04-10 MED ORDER — FUROSEMIDE 40 MG PO TABS: 40.0000 mg | ORAL_TABLET | Freq: Every day | ORAL | 3 refills | Status: DC

## 2020-04-10 MED ORDER — ATORVASTATIN CALCIUM 80 MG PO TABS: 80.0000 mg | ORAL_TABLET | Freq: Every day | ORAL | 3 refills | Status: DC

## 2020-04-10 MED ORDER — LOSARTAN POTASSIUM 50 MG PO TABS: 50.0000 mg | ORAL_TABLET | Freq: Every day | ORAL | 3 refills | Status: DC

## 2020-04-10 NOTE — Patient Instructions (Signed)
Medication Instructions:  Your physician recommends that you continue on your current medications as directed. Please refer to the Current Medication list given to you today.  *If you need a refill on your cardiac medications before your next appointment, please call your pharmacy*   Lab Work: None If you have labs (blood work) drawn today and your tests are completely normal, you will receive your results only by: Marland Kitchen MyChart Message (if you have MyChart) OR . A paper copy in the mail If you have any lab test that is abnormal or we need to change your treatment, we will call you to review the results.   Testing/Procedures: Your physician has requested that you have a lower extremity venous duplex. This test is an ultrasound of the veins in the legs or arms. It looks at venous blood flow that carries blood from the heart to the legs or arms. Allow one hour for a Lower Venous exam. There are no restrictions or special instructions.   Follow-Up: At [facility], you and your health needs are our priority.  As part of our continuing mission to provide you with exceptional heart care, we have created designated Provider Care Teams.  These Care Teams include your primary Cardiologist (physician) and Advanced Practice Providers (APPs -  Physician Assistants and Nurse Practitioners) who all work together to provide you with the care you need, when you need it.  We recommend signing up for the patient portal called "MyChart".  Sign up information is provided on this After Visit Summary.  MyChart is used to connect with patients for Virtual Visits (Telemedicine).  Patients are able to view lab/test results, encounter notes, upcoming appointments, etc.  Non-urgent messages can be sent to your provider as well.   To learn more about what you can do with MyChart, go to [email or web address].    Your next appointment:   12 month(s)  The format for your next appointment:   In Person  Provider:    Tobias Alexander, MD   Other Instructions You have been referred to the [facility]. They will call you to schedule an appointment.

## 2020-04-12 ENCOUNTER — Ambulatory Visit (HOSPITAL_COMMUNITY)
Admission: RE | Admit: 2020-04-12 | Discharge: 2020-04-12 | Disposition: A | Discharge status: Home / Self Care | Payer: 59 | Source: Ambulatory Visit | Attending: Cardiovascular Disease | Admitting: Cardiovascular Disease

## 2020-04-12 ENCOUNTER — Other Ambulatory Visit: Payer: Self-pay

## 2020-04-12 DIAGNOSIS — R600 Localized edema: Secondary | ICD-10-CM | POA: Diagnosis not present

## 2020-04-29 ENCOUNTER — Other Ambulatory Visit: Payer: Self-pay | Admitting: Cardiology

## 2021-05-05 ENCOUNTER — Telehealth: Payer: Self-pay | Admitting: Student

## 2021-05-05 MED ORDER — LOSARTAN POTASSIUM 50 MG PO TABS: 50.0000 mg | ORAL_TABLET | Freq: Every day | ORAL | 1 refills | Status: DC

## 2021-05-05 MED ORDER — OMEGA-3-ACID ETHYL ESTERS 1 G PO CAPS: 2.0000 g | ORAL_CAPSULE | Freq: Every day | ORAL | 4 refills | Status: DC

## 2021-05-05 MED ORDER — ATORVASTATIN CALCIUM 80 MG PO TABS: 80.0000 mg | ORAL_TABLET | Freq: Every day | ORAL | 1 refills | Status: DC

## 2021-05-05 NOTE — Telephone Encounter (Signed)
Refills sent 05/05/21

## 2021-05-05 NOTE — Telephone Encounter (Signed)
*  STAT* If patient is at the pharmacy, call can be transferred to refill team.   1. Which medications need to be refilled? (please list name of each medication and dose if known)  atorvastatin (LIPITOR) 80 MG tablet losartan (COZAAR) 50 MG tablet omega-3 acid ethyl esters (LOVAZA) 1 g capsule  2. Which pharmacy/location (including street and city if local pharmacy) is medication to be sent to?  [facility] - [place], [place] - [address]  3. Do they need a 30 day or 90 day supply?  90 day supply  FYI--Previous patient of Dr. Delton See. Would like to be followed by Dr. Allyson Sabal. Scheduled patient for 12/13 with Marjie Skiff, PA for now.

## 2021-06-09 NOTE — Progress Notes (Signed)
Cardiology Office Note:    Date:  06/17/2021   ID:  Jacob Hunt, DOB October 31, 1970, MRN [id number]  PCP:  Asencion Gowda.August Saucer, MD  Cardiologist:  Nanetta Batty, MD  Electrophysiologist:  None   Referring MD: Asencion Gowda.August Saucer, MD   Chief Complaint: follow-up of CAD and diastolic CHF  History of Present Illness:    Jacob Hunt is a 50 y.o. male with a history of mild non-obstructive CAD on cardiac catheterization in 03/2017, chronic diastolic CHF with chronic lower extremity edema, hypertension, hyperlipidemia, and super morbid obesity with BMI of 72 who was previously followed by Dr. Delton See and presents today for routine follow-up of CAD and diastolic CHF.  Patient was admitted in 03/2017 with NSTEMI after waking up with chest pain. Troponin I peaked at 6.29. Cardiac catheterization at that time showed widely patent coronary arteries with mild ectasia of the proxima LAD and minimal non-obstructive disease. Echo showed LVEF of 55-60% with normal wall motion and grade 1 diastolic dysfunction. Chest CTA was negative for PE. Unclear what caused troponin elevation and patient was treated medically. Outpatient monitor was ordered to make sure he did not have a tachyarrhythmias that caused demand ischemia. This came back normal with no significant arrhythmias.   Patient was last seen by Jacolyn Reedy, PA-C, in 04/2020 at which time he had gained 36 lbs since his last visit and was not denied any cardiac complaints. He did report right lower extremity edema at night though. Venous dopplers was ordered and was negative for DVT.  Patient presents today for follow-up.  Here alone.  Patient has been doing well from a cardiac standpoint since his last visit.  He denies any chest pain.  He does have chronic dyspnea on exertion which he attributes to his weight but this is stable.  No orthopnea or PND.  He has chronic lower extremity edema which is also stable.  Edema is worse towards the end of the day and  improves by the next morning.  He has a Lasix 40 mg listed under his medication list but states he has not been taking this due to significant leg cramps with this.  However, he does state that when he takes Lasix, his edema is much better. He did try cutting his Lasix tablet in half and he was able to tolerate this better.  He notes occasional palpitations if he eats certain foods (like sesame chicken) but nothing significant.  He states he recently had some problems with vertigo and had some dizziness with this but this has improved.  No syncope.  He is up about 10 pounds from his visit in 04/2020.  He is very sedentary but is trying to change his diet and cut back on how much soda he is drinking.  His BP is elevated in the office today at 151/86; however, he states this is very abnormal for him.  He states his systolic BP is normally in the 120s to 130s at home.  He does snore at night but denies any apneic episodes episodes or significant daytime somnolence.  Past Medical History:  Diagnosis Date   CAD (coronary artery disease)    non-obstructive CAD on LHC in 03/2017   Chronic diastolic CHF (congestive heart failure) (HCC)    Chronic lower extremity edema    Essential hypertension    GERD (gastroesophageal reflux disease)    Hyperlipidemia    Lumbar herniated disc    Morbid obesity (HCC)     Past Surgical History:  Procedure  Laterality Date   CARDIAC CATHETERIZATION N/A 08/02/2015   Procedure: Left Heart Cath and Coronary Angiography;  Surgeon: Peter M Swaziland, MD;  Location: [facility] INVASIVE CV LAB;  Service: Cardiovascular;  Laterality: N/A;   CARDIAC CATHETERIZATION  03/15/2017   LEFT HEART CATH AND CORONARY ANGIOGRAPHY N/A 03/15/2017   Procedure: LEFT HEART CATH AND CORONARY ANGIOGRAPHY;  Surgeon: Tonny Bollman, MD;  Location: [facility] INVASIVE CV LAB;  Service: Cardiovascular;  Laterality: N/A;    Current Medications: Current Meds  Medication Sig   aspirin EC 81 MG tablet Take 1 tablet (81 mg  total) by mouth daily.   atorvastatin (LIPITOR) 80 MG tablet Take 1 tablet (80 mg total) by mouth daily at 6 PM.   Cholecalciferol (VITAMIN D) 125 MCG (5000 UT) CAPS Take by mouth.   furosemide (LASIX) 40 MG tablet Take 1 tablet (40 mg total) by mouth daily.   losartan (COZAAR) 50 MG tablet Take 1 tablet (50 mg total) by mouth daily.   nitroGLYCERIN (NITROSTAT) 0.4 MG SL tablet Place 1 tablet (0.4 mg total) under the tongue every 5 (five) minutes x 3 doses as needed for chest pain.   omega-3 acid ethyl esters (LOVAZA) 1 g capsule Take 2 capsules (2 g total) by mouth daily.     Allergies:   Patient has no known allergies.   Social History   Socioeconomic History   Marital status: Married    Spouse name: Not on file   Number of children: Not on file   Years of education: Not on file   Highest education level: Not on file  Occupational History   Not on file  Tobacco Use   Smoking status: Never   Smokeless tobacco: Never  Vaping Use   Vaping Use: Never used  Substance and Sexual Activity   Alcohol use: No    Alcohol/week: 0.0 standard drinks   Drug use: No   Sexual activity: Yes  Other Topics Concern   Not on file  Social History Narrative   Not on file   Social Determinants of Health   Financial Resource Strain: Not on file  Food Insecurity: Not on file  Transportation Needs: Not on file  Physical Activity: Not on file  Stress: Not on file  Social Connections: Not on file     Family History: The patient's family history includes CAD in his brother, father, and mother; Cancer in his maternal aunt, maternal grandfather, and mother; Coronary artery disease in his maternal grandfather; Diabetes in his brother, father, and mother; Hypertension in his brother, father, and mother; Other in an other family member.  ROS:   Please see the history of present illness.     EKGs/Labs/Other Studies Reviewed:    The following studies were reviewed today:  Left Cardiac  Catheterization 03/16/2017: 1. Widely patent coronary arteries with mild ectasia of the proximal LAD and minimal nonobstructive disease involving the proximal RCA, unchanged from 2017 cath 2. Normal LV systolic function   Suspect noncardiac symptoms. _______________  Echocardiogram 03/16/2017: Study Conclusions: - Left ventricle: The cavity size was normal. There was mild focal    basal hypertrophy of the septum. Systolic function was normal.    The estimated ejection fraction was in the range of 55% to 60%.    Wall motion was normal; there were no regional wall motion    abnormalities. Doppler parameters are consistent with abnormal    left ventricular relaxation (grade 1 diastolic dysfunction).   Impressions: - Technically diffiicult; definity used; normal LV  systolic    function; mild diastolic dysfunction. _______________  Event Monitor 03/31/2017 to 04/29/2017: Sinus bradycardia to sinus rhythm. No arrhythmias or pauses.   Normal 30 days event monitor, no arrhythmias or pauses were identified.  EKG:  EKG ordered today. EKG personally reviewed and demonstrates normal sinus rhythm, rate 63 bpm, with no acute ST/T changes. Left axis deviation. Normal PR and QRS intervals. QTc 395 ms.  Recent Labs: No results found for requested labs within last 8760 hours.  Recent Lipid Panel    Component Value Date/Time   CHOL 149 04/08/2020 0747   TRIG 127 04/08/2020 0747   HDL 48 04/08/2020 0747   CHOLHDL 3.1 04/08/2020 0747   CHOLHDL 5.2 03/16/2017 0132   VLDL 47 (H) 03/16/2017 0132   LDLCALC 78 04/08/2020 0747    Physical Exam:    Vital Signs: BP (!) 151/86   Pulse 63   Ht 5\' 7"  (1.702 m)   Wt (!) 464 lb 6.4 oz (210.7 kg)   SpO2 98%   BMI 72.74 kg/m     Wt Readings from Last 3 Encounters:  06/17/21 (!) 464 lb 6.4 oz (210.7 kg)  04/10/20 (!) 454 lb 12.8 oz (206.3 kg)  01/19/19 (!) 418 lb (189.6 kg)     General: 50 y.o. morbidly obese Caucasian male in no acute  distress. HEENT: Normocephalic and atraumatic. Sclera clear.  Neck: Supple. No carotid bruits. JVD difficult to assess due to body habitus. Heart: RRR. Distinct S1 and S2. No murmurs, gallops, or rubs. Radial and distal pedal pulses 2+ and equal bilaterally. Lungs: No increased work of breathing. Clear to ausculation bilaterally. No wheezes, rhonchi, or rales.  Abdomen: Soft, non-distended, and non-tender to palpation.  MSK: Normal strength and tone for age.  Extremities: 2+ pitting edema of bilateral lower extremities (right chronically larger than right). Skin: Warm and dry. Neuro: Alert and oriented x3. No focal deficits. Psych: Normal affect. Responds appropriately.  Assessment:    1. Coronary artery disease involving native coronary artery of native heart without angina pectoris   2. Chronic diastolic CHF (congestive heart failure) (HCC)   3. Primary hypertension   4. Hyperlipidemia, unspecified hyperlipidemia type   5. Morbid obesity (HCC)   6. Snoring     Plan:    CAD History of NSTEMI in 2018 but LHC at that time showed normal coronaries and chest CTA was negative for PE.  - No chest pain. - Continue aspirin and high-sensitivity statin.  Chronic Diastolic CHF Patient has mild diastolic dysfunction and chronic lower extremity edema. Last Echo in 03/2017 showed LVEF of 55-60% with normal wall motion and grade 1 diastolic dysfunction.  - Volume status difficult to assess due to body habitus; however, he does have significant lower extremity edema on exam which is chronic. - Patient has not been taking his Lasix 40mg  daily due to significant muscle cramps with this. However, he states he was able to tolerate 20mg  better. Will try 20mg  twice daily instead to see if he is able to tolerate this better. Will check CMET and Magnesium today to make sure electrolytes are OK.  Hypertension BP elevated in the office today at 151/86. He states this is very abnormal for him and states  systolic BP is normally in the 120s to 130s at home. However, he states he also has been missing dosing of his Losartan every now and then. - Continue Losartan 50mg  daily. - Asked patient to keep a BP/HR log for 2 weeks and then send  this to [place].  Hyperlipidemia Last lipid panel in 04/2020: Total Cholesterol 149, Triglycerides 127, HDL 48, LDL 78. - Continue Lipitor  daily and Lovaza 2g daily.  - Patient today so he will come back within the next month for a lipid panel.  Super Morbid Obesity BMI 72.74. - Discussed the importance of weight loss.  - [facility]  Snoring Patient reports snoring at night but denies any apneic episodes or significant daytime somnolence. However, given his BMI, suspect he may have sleep apnea.Untreated sleep apnea can also make hypertension difficult to treat. - Ordered sleep study. Patient requested a home sleep study.  Disposition: Follow up in 6 months. Patient previously followed by Dr. Delton See and would now like to follow-up with Dr. Allyson Sabal who also takes care of his father.   Medication Adjustments/Labs and Tests Ordered: Current medicines are reviewed at length with the patient today.  Concerns regarding medicines are outlined above.  Orders Placed This Encounter  Procedures   Comprehensive metabolic panel   Magnesium   Lipid panel   Amb Ref to Medical Weight [facility]   EKG 12-Lead   Home sleep test    No orders of the defined types were placed in this encounter.   Patient Instructions  Medication Instructions:  No Changes *If you need a refill on your cardiac medications before your next appointment, please call your pharmacy*   Lab Work: CMET, Magnesium, : Today. Lipid Panel : 1 Month. If you have labs (blood work) drawn today and your tests are completely normal, you will receive your results only by: MyChart Message (if you have MyChart) OR A paper copy in the mail If you have any  lab test that is abnormal or we need to change your treatment, we will call you to review the results.   Testing/Procedures: [facility], [address] [place], [address] Your physician has recommended that you have a sleep study. This test records several body functions during sleep, including: brain activity, eye movement, oxygen and carbon dioxide blood levels, heart rate and rhythm, breathing rate and rhythm, the flow of air through your mouth and nose, snoring, body muscle movements, and chest and belly movement.    Follow-Up: At [facility], you and your health needs are our priority.  As part of our continuing mission to provide you with exceptional heart care, we have created designated Provider Care Teams.  These Care Teams include your primary Cardiologist (physician) and Advanced Practice Providers (APPs -  Physician Assistants and Nurse Practitioners) who all work together to provide you with the care you need, when you need it.  We recommend signing up for the patient portal called "MyChart".  Sign up information is provided on this After Visit Summary.  MyChart is used to connect with patients for Virtual Visits (Telemedicine).  Patients are able to view lab/test results, encounter notes, upcoming appointments, etc.  Non-urgent messages can be sent to your provider as well.   To learn more about what you can do with MyChart, go to [email or web address].    Your next appointment:   6 month(s)  The format for your next appointment:   In Person  Provider:   on Nanetta Batty, MD :1}        Signed, Corrin Parker, PA-C  06/17/2021 7:59 PM    [facility]

## 2021-06-14 ENCOUNTER — Encounter: Payer: Self-pay | Admitting: Student

## 2021-06-14 DIAGNOSIS — I2510 Atherosclerotic heart disease of native coronary artery without angina pectoris: Secondary | ICD-10-CM | POA: Insufficient documentation

## 2021-06-14 DIAGNOSIS — K219 Gastro-esophageal reflux disease without esophagitis: Secondary | ICD-10-CM | POA: Insufficient documentation

## 2021-06-14 DIAGNOSIS — R600 Localized edema: Secondary | ICD-10-CM | POA: Insufficient documentation

## 2021-06-14 DIAGNOSIS — I5032 Chronic diastolic (congestive) heart failure: Secondary | ICD-10-CM | POA: Insufficient documentation

## 2021-06-17 ENCOUNTER — Ambulatory Visit: Payer: 59 | Admitting: Student

## 2021-06-17 ENCOUNTER — Other Ambulatory Visit: Payer: Self-pay

## 2021-06-17 ENCOUNTER — Encounter: Payer: Self-pay | Admitting: Student

## 2021-06-17 VITALS — BP 151/86 | HR 63 | Ht 67.0 in | Wt >= 6400 oz

## 2021-06-17 DIAGNOSIS — I10 Essential (primary) hypertension: Secondary | ICD-10-CM

## 2021-06-17 DIAGNOSIS — I2510 Atherosclerotic heart disease of native coronary artery without angina pectoris: Secondary | ICD-10-CM | POA: Diagnosis not present

## 2021-06-17 DIAGNOSIS — R600 Localized edema: Secondary | ICD-10-CM

## 2021-06-17 DIAGNOSIS — R0683 Snoring: Secondary | ICD-10-CM

## 2021-06-17 DIAGNOSIS — E785 Hyperlipidemia, unspecified: Secondary | ICD-10-CM | POA: Diagnosis not present

## 2021-06-17 DIAGNOSIS — I5032 Chronic diastolic (congestive) heart failure: Secondary | ICD-10-CM

## 2021-06-17 DIAGNOSIS — K219 Gastro-esophageal reflux disease without esophagitis: Secondary | ICD-10-CM

## 2021-06-17 NOTE — Patient Instructions (Signed)
Medication Instructions:  No Changes *If you need a refill on your cardiac medications before your next appointment, please call your pharmacy*   Lab Work: CMET, Magnesium, : Today. Lipid Panel : 1 Month. If you have labs (blood work) drawn today and your tests are completely normal, you will receive your results only by: MyChart Message (if you have MyChart) OR A paper copy in the mail If you have any lab test that is abnormal or we need to change your treatment, we will call you to review the results.   Testing/Procedures: [facility], [address] [place], [address] Your physician has recommended that you have a sleep study. This test records several body functions during sleep, including: brain activity, eye movement, oxygen and carbon dioxide blood levels, heart rate and rhythm, breathing rate and rhythm, the flow of air through your mouth and nose, snoring, body muscle movements, and chest and belly movement.    Follow-Up: At [facility], you and your health needs are our priority.  As part of our continuing mission to provide you with exceptional heart care, we have created designated Provider Care Teams.  These Care Teams include your primary Cardiologist (physician) and Advanced Practice Providers (APPs -  Physician Assistants and Nurse Practitioners) who all work together to provide you with the care you need, when you need it.  We recommend signing up for the patient portal called "MyChart".  Sign up information is provided on this After Visit Summary.  MyChart is used to connect with patients for Virtual Visits (Telemedicine).  Patients are able to view lab/test results, encounter notes, upcoming appointments, etc.  Non-urgent messages can be sent to your provider as well.   To learn more about what you can do with MyChart, go to [email or web address].    Your next appointment:   6 month(s)  The format for your next appointment:   In  Person  Provider:   on Nanetta Batty, MD :1}

## 2021-06-19 LAB — COMPREHENSIVE METABOLIC PANEL
ALT: 22 IU/L (ref 0–44)
AST: 20 IU/L (ref 0–40)
Albumin/Globulin Ratio: 2.2 (ref 1.2–2.2)
Albumin: 4.1 g/dL (ref 4.0–5.0)
Alkaline Phosphatase: 100 IU/L (ref 44–121)
BUN/Creatinine Ratio: 16 (ref 9–20)
BUN: 14 mg/dL (ref 6–24)
Bilirubin Total: 0.4 mg/dL (ref 0.0–1.2)
CO2: 23 mmol/L (ref 20–29)
Calcium: 9.1 mg/dL (ref 8.7–10.2)
Chloride: 103 mmol/L (ref 96–106)
Creatinine, Ser: 0.89 mg/dL (ref 0.76–1.27)
Globulin, Total: 1.9 g/dL (ref 1.5–4.5)
Glucose: 104 mg/dL — ABNORMAL HIGH (ref 70–99)
Potassium: 4.6 mmol/L (ref 3.5–5.2)
Sodium: 139 mmol/L (ref 134–144)
Total Protein: 6 g/dL (ref 6.0–8.5)
eGFR: 106 mL/min/{1.73_m2} (ref >59)

## 2021-06-19 LAB — MAGNESIUM: Magnesium: 2 mg/dL (ref 1.6–2.3)

## 2021-12-09 ENCOUNTER — Ambulatory Visit: Payer: 59 | Admitting: Cardiovascular Disease

## 2021-12-09 ENCOUNTER — Encounter: Payer: Self-pay | Admitting: Cardiovascular Disease

## 2021-12-09 DIAGNOSIS — Z8249 Family history of ischemic heart disease and other diseases of the circulatory system: Secondary | ICD-10-CM | POA: Diagnosis not present

## 2021-12-09 DIAGNOSIS — E785 Hyperlipidemia, unspecified: Secondary | ICD-10-CM

## 2021-12-09 DIAGNOSIS — I214 Non-ST elevation (NSTEMI) myocardial infarction: Secondary | ICD-10-CM

## 2021-12-09 DIAGNOSIS — I5032 Chronic diastolic (congestive) heart failure: Secondary | ICD-10-CM

## 2021-12-09 DIAGNOSIS — I5033 Acute on chronic diastolic (congestive) heart failure: Secondary | ICD-10-CM

## 2021-12-09 DIAGNOSIS — I10 Essential (primary) hypertension: Secondary | ICD-10-CM

## 2021-12-09 MED ORDER — FUROSEMIDE 40 MG PO TABS: 40.0000 mg | ORAL_TABLET | Freq: Every day | ORAL | 3 refills | Status: DC

## 2021-12-09 MED ORDER — LOSARTAN POTASSIUM 50 MG PO TABS: 50.0000 mg | ORAL_TABLET | Freq: Every day | ORAL | 3 refills | Status: DC

## 2021-12-09 MED ORDER — ATORVASTATIN CALCIUM 80 MG PO TABS: 80.0000 mg | ORAL_TABLET | Freq: Every day | ORAL | 3 refills | Status: DC

## 2021-12-09 MED ORDER — NITROGLYCERIN 0.4 MG SL SUBL: 0.4000 mg | SUBLINGUAL_TABLET | SUBLINGUAL | 5 refills | Status: DC | PRN

## 2021-12-09 NOTE — Patient Instructions (Signed)
Medication Instructions:  ?Your physician recommends that you continue on your current medications as directed. Please refer to the Current Medication list given to you today. ? ?*If you need a refill on your cardiac medications before your next appointment, please call your pharmacy* ? ? ?Lab Work: ?Your physician recommends that you have labs drawn today: lipid/liver profile ? ?If you have labs (blood work) drawn today and your tests are completely normal, you will receive your results only by: ?MyChart Message (if you have MyChart) OR ?A paper copy in the mail ?If you have any lab test that is abnormal or we need to change your treatment, we will call you to review the results. ? ? ?Follow-Up: ?At [facility], you and your health needs are our priority.  As part of our continuing mission to provide you with exceptional heart care, we have created designated Provider Care Teams.  These Care Teams include your primary Cardiologist (physician) and Advanced Practice Providers (APPs -  Physician Assistants and Nurse Practitioners) who all work together to provide you with the care you need, when you need it. ? ?We recommend signing up for the patient portal called "MyChart".  Sign up information is provided on this After Visit Summary.  MyChart is used to connect with patients for Virtual Visits (Telemedicine).  Patients are able to view lab/test results, encounter notes, upcoming appointments, etc.  Non-urgent messages can be sent to your provider as well.   ?To learn more about what you can do with MyChart, go to [email or web address].   ? ?Your next appointment:   ?We will see you on an as needed basis. ? ?Provider:   ?Jonathan Berry, MD ? ?

## 2021-12-09 NOTE — Progress Notes (Signed)
12/09/2021 [facility]   Nov 08, 1970  [id number]  Primary Physician Clovis Riley, L.August Saucer, MD Primary Cardiologist: Runell Gess MD [name], [place]  HPI:  Jacob Hunt is a 51 y.o. 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  He was referred to be established in my practice.  He previously was seen by Dr. Delton See.  He has a history of treated hypertension hyperlipidemia.  He has a strong family history for heart disease with both his parents and his brother all of whom had stents.  He is never had a heart attack or stroke.  He had 2 normal cardiac catheterizations in the past most recently by Dr. Swaziland 08/02/2015.  He denies chest pain or shortness of breath.   Current Meds  Medication Sig   aspirin EC 81 MG tablet Take 1 tablet (81 mg total) by mouth daily.   Cholecalciferol (VITAMIN D) 125 MCG (5000 UT) CAPS Take by mouth.   omega-3 acid ethyl esters (LOVAZA) 1 g capsule Take 2 capsules (2 g total) by mouth daily.   [DISCONTINUED] atorvastatin (LIPITOR) 80 MG tablet Take 1 tablet (80 mg total) by mouth daily at 6 PM.   [DISCONTINUED] furosemide (LASIX) 40 MG tablet Take 1 tablet (40 mg total) by mouth daily.   [DISCONTINUED] losartan (COZAAR) 50 MG tablet Take 1 tablet (50 mg total) by mouth daily.   [DISCONTINUED] nitroGLYCERIN (NITROSTAT) 0.4 MG SL tablet Place 1 tablet (0.4 mg total) under the tongue every 5 (five) minutes x 3 doses as needed for chest pain.     No Known Allergies  Social History   Socioeconomic History   Marital status: Married    Spouse name: Not on file   Number of children: Not on file   Years of education: Not on file   Highest education level: Not on file  Occupational History   Not on file  Tobacco Use   Smoking status: Never   Smokeless tobacco: Never  Vaping Use   Vaping Use: Never used  Substance and Sexual Activity   Alcohol use: No    Alcohol/week: 0.0  standard drinks   Drug use: No   Sexual activity: Yes  Other Topics Concern   Not on file  Social History Narrative   Not on file   Social Determinants of Health   Financial Resource Strain: Not on file  Food Insecurity: Not on file  Transportation Needs: Not on file  Physical Activity: Not on file  Stress: Not on file  Social Connections: Not on file  Intimate Partner Violence: Not on file     Review of Systems: General: negative for chills, fever, night sweats or weight changes.  Cardiovascular: negative for chest pain, dyspnea on exertion, edema, orthopnea, palpitations, paroxysmal nocturnal dyspnea or shortness of breath Dermatological: negative for rash Respiratory: negative for cough or wheezing Urologic: negative for hematuria Abdominal: negative for nausea, vomiting, diarrhea, bright red blood per rectum, melena, or hematemesis Neurologic: negative for visual changes, syncope, or dizziness All other systems reviewed and are otherwise negative except as noted above.    Blood pressure (!) 156/90, pulse 65, height 5\' 7"  (1.702 m), weight (!) 477 lb 3.2 oz (216.5 kg), SpO2 92 %.  General appearance: alert and no distress Neck: no adenopathy, no carotid bruit, no JVD, supple, symmetrical, trachea midline, and thyroid not enlarged, symmetric, no tenderness/mass/nodules Lungs: clear to auscultation bilaterally Heart: regular rate and rhythm,  S1, S2 normal, no murmur, click, rub or gallop Extremities: extremities normal, atraumatic, no cyanosis or edema Pulses: 2+ and symmetric Skin: Skin color, texture, turgor normal. No rashes or lesions Neurologic: Grossly normal  EKG sinus rhythm at 65 with first-degree AV block and left axis deviation.  I personally reviewed this EKG.  ASSESSMENT AND PLAN:   Morbid obesity (HCC) History of morbid obesity with a BMI of 74.  I am referring him to the [facility] diet wellness center for further evaluation and  treatment  Hyperlipidemia History of hyperlipidemia on statin therapy with lipid profile performed 10//21 revealing total cholesterol 149, LDL 78 and HDL 48.  I am going to recheck a lipid liver profile.  Family history of premature CAD History of family history of heart disease with a father and mother both of who had myocardial infarction's and stents as well as a brother.  Essential hypertension History of essential hypertension a blood pressure measured today 156/90.  He is on losartan.  Chronic diastolic CHF (congestive heart failure) (HCC) History of chronic diastolic heart failure on furosemide.     Runell Gess MD FACP,FACC,FAHA, [facility] 12/09/2021 2:48 PM

## 2021-12-09 NOTE — Assessment & Plan Note (Signed)
History of hyperlipidemia on statin therapy with lipid profile performed 10//21 revealing total cholesterol 149, LDL 78 and HDL 48.  I am going to recheck a lipid liver profile.

## 2021-12-09 NOTE — Assessment & Plan Note (Signed)
History of family history of heart disease with a father and mother both of who had myocardial infarction's and stents as well as a brother.

## 2021-12-09 NOTE — Assessment & Plan Note (Signed)
History of chronic diastolic heart failure on furosemide. 

## 2021-12-09 NOTE — Assessment & Plan Note (Signed)
History of essential hypertension a blood pressure measured today 156/90.  He is on losartan.

## 2021-12-09 NOTE — Assessment & Plan Note (Signed)
History of morbid obesity with a BMI of 74.  I am referring him to the [facility] diet wellness center for further evaluation and treatment

## 2021-12-10 LAB — HEPATIC FUNCTION PANEL
ALT: 17 IU/L (ref 0–44)
AST: 15 IU/L (ref 0–40)
Albumin: 4.1 g/dL (ref 4.0–5.0)
Alkaline Phosphatase: 103 IU/L (ref 44–121)
Bilirubin Total: 0.5 mg/dL (ref 0.0–1.2)
Bilirubin, Direct: 0.13 mg/dL (ref 0.00–0.40)
Total Protein: 6.3 g/dL (ref 6.0–8.5)

## 2021-12-10 LAB — LIPID PANEL
Chol/HDL Ratio: 5.1 ratio — ABNORMAL HIGH (ref 0.0–5.0)
Cholesterol, Total: 239 mg/dL — ABNORMAL HIGH (ref 100–199)
HDL: 47 mg/dL (ref >39)
LDL Chol Calc (NIH): 153 mg/dL — ABNORMAL HIGH (ref 0–99)
Triglycerides: 216 mg/dL — ABNORMAL HIGH (ref 0–149)
VLDL Cholesterol Cal: 39 mg/dL (ref 5–40)

## 2022-01-05 ENCOUNTER — Other Ambulatory Visit: Payer: Self-pay | Admitting: Student

## 2022-03-06 ENCOUNTER — Telehealth: Payer: Self-pay | Admitting: Cardiovascular Disease

## 2022-03-06 DIAGNOSIS — E785 Hyperlipidemia, unspecified: Secondary | ICD-10-CM

## 2022-03-06 NOTE — Telephone Encounter (Signed)
Patient states he was returning call. Please advise ?

## 2022-03-06 NOTE — Telephone Encounter (Signed)
Spoke with pt regarding lab work done in June. Unable to get in touch with pt until now. Pt's LDL cholesterol has gone from 78 in October 2021 to 153. Pt does admit to not taking his medication like he is supposed to, he states that he sometimes misses his medication for weeks. Pt states that has not changed his diet. Encouraged pt to take his medication as prescribed. Advised pt that he can use his phone to set alarms to remind him to take his medication. Pt is going to try to take his medication for the next 3 months and at that time we will repeat labs. Lab orders placed and mailed to pt. Pt verbalizes understanding.

## 2022-04-03 ENCOUNTER — Other Ambulatory Visit: Payer: Self-pay | Admitting: Sports Medicine

## 2022-04-03 ENCOUNTER — Ambulatory Visit
Admission: RE | Admit: 2022-04-03 | Discharge: 2022-04-03 | Disposition: A | Discharge status: Home / Self Care | Payer: 59 | Source: Ambulatory Visit | Attending: Sports Medicine | Admitting: Sports Medicine

## 2022-04-03 DIAGNOSIS — M25562 Pain in left knee: Secondary | ICD-10-CM

## 2022-04-16 ENCOUNTER — Ambulatory Visit (INDEPENDENT_AMBULATORY_CARE_PROVIDER_SITE_OTHER): Payer: 59 | Admitting: Family Medicine

## 2022-04-16 ENCOUNTER — Encounter (INDEPENDENT_AMBULATORY_CARE_PROVIDER_SITE_OTHER): Payer: Self-pay | Admitting: Family Medicine

## 2022-04-16 VITALS — BP 129/82 | HR 100 | Temp 98.0°F | Ht 66.0 in | Wt >= 6400 oz

## 2022-04-16 DIAGNOSIS — E559 Vitamin D deficiency, unspecified: Secondary | ICD-10-CM | POA: Diagnosis not present

## 2022-04-16 DIAGNOSIS — Z6841 Body Mass Index (BMI) 40.0 and over, adult: Secondary | ICD-10-CM

## 2022-04-16 DIAGNOSIS — E669 Obesity, unspecified: Secondary | ICD-10-CM | POA: Diagnosis not present

## 2022-04-16 DIAGNOSIS — I5032 Chronic diastolic (congestive) heart failure: Secondary | ICD-10-CM | POA: Diagnosis not present

## 2022-04-16 DIAGNOSIS — Z0289 Encounter for other administrative examinations: Secondary | ICD-10-CM

## 2022-04-16 DIAGNOSIS — E639 Nutritional deficiency, unspecified: Secondary | ICD-10-CM | POA: Insufficient documentation

## 2022-04-23 NOTE — Progress Notes (Signed)
     Chief Complaint:   Initial Visit  Jacob Hunt was seen in clinic today to evaluate for obesity. He is interested in losing weight to improve overall health and reduce the risk of weight related complications. He presents today to review program treatment options, initial physical assessment, and evaluation.     Today's weight: 469 lbs. Today's date: 04/23/2022  Interim History: Patient ready to work on weight loss, referred by Dr. Alvester Chou.  Overweight since childhood with family history of obesity and acute MI.  Has chronic diastolic congestive heart failure and mild nonobstructive CAD.  Scheduled for home sleep study.  Gained weight with job change 13 years ago-more sedentary now.  Subjective:   1. Chronic diastolic CHF (congestive heart failure) ([place]) Reviewed echocardiogram and cardiac cath from 2018. Managed by Dr. Alvester Chou.  2. Poor nutrition He has long history of heavy soda intake, picky eating, and meal skipping.  3. Vitamin D deficiency On over-the-counter vitamin D 5000 IU daily.  Assessment/Plan:  1. Chronic diastolic CHF (congestive heart failure) ([place]) Reviewed importance of weight reduction for the treatment of diastolic dysfunction.  2. Poor nutrition We will be working on making big improvements.  3. Vitamin D deficiency Plan to update lab next visit.  4. Obesity: Current BMI 75.7 Brode Sculley Marano appears to be in the action stage of change and states he is ready to start intensive lifestyle modifications and behavioral modifications.  Izea has agreed to follow-up with our clinic in 2 weeks. He was informed of the importance of frequent follow-up visits to maximize his success with intensive lifestyle modifications for his multiple health conditions.   Objective:   Blood pressure 129/82, pulse 100, temperature 98 F (36.7 C), height 5\' 6"  (1.676 m), weight (!) 469 lb (212.7 kg), SpO2 94 %. Body mass index is 75.7 kg/m.  General: Cooperative, alert,  well developed, in no acute distress. HEENT: Conjunctivae and lids unremarkable. Cardiovascular: Regular rhythm.  Lungs: Normal work of breathing. Neurologic: No focal deficits.   Lab Results  Component Value Date   CREATININE 0.89 06/19/2021   BUN 14 06/19/2021   NA 139 06/19/2021   K 4.6 06/19/2021   CL 103 06/19/2021   CO2 23 06/19/2021   Lab Results  Component Value Date   ALT 17 12/09/2021   AST 15 12/09/2021   ALKPHOS 103 12/09/2021   BILITOT 0.5 12/09/2021   Lab Results  Component Value Date   HGBA1C 5.6 05/03/2017   No results found for: "INSULIN" Lab Results  Component Value Date   TSH 3.350 04/08/2020   Lab Results  Component Value Date   CHOL 239 (H) 12/09/2021   HDL 47 12/09/2021   LDLCALC 153 (H) 12/09/2021   TRIG 216 (H) 12/09/2021   CHOLHDL 5.1 (H) 12/09/2021   No results found for: "VD25OH" Lab Results  Component Value Date   WBC 7.0 04/08/2020   HGB 13.8 04/08/2020   HCT 41.4 04/08/2020   MCV 88 04/08/2020   PLT 251 04/08/2020   No results found for: "IRON", "TIBC", "FERRITIN"  Attestation Statements:   Reviewed by clinician on day of visit: allergies, medications, problem list, medical history, surgical history, family history, social history, and previous encounter notes.  I, Georgianne Fick, FNP, am acting as transcriptionist for Dr. Loyal Gambler.  I have reviewed the above documentation for accuracy and completeness, and I agree with the above. Dell Ponto, DO

## 2022-05-07 ENCOUNTER — Ambulatory Visit (INDEPENDENT_AMBULATORY_CARE_PROVIDER_SITE_OTHER): Payer: 59 | Admitting: Family Medicine

## 2022-05-07 ENCOUNTER — Encounter (INDEPENDENT_AMBULATORY_CARE_PROVIDER_SITE_OTHER): Payer: Self-pay | Admitting: Family Medicine

## 2022-05-07 VITALS — BP 132/77 | HR 97 | Temp 97.7°F | Ht 66.0 in | Wt >= 6400 oz

## 2022-05-07 DIAGNOSIS — M1712 Unilateral primary osteoarthritis, left knee: Secondary | ICD-10-CM | POA: Insufficient documentation

## 2022-05-07 DIAGNOSIS — R0602 Shortness of breath: Secondary | ICD-10-CM | POA: Diagnosis not present

## 2022-05-07 DIAGNOSIS — R5383 Other fatigue: Secondary | ICD-10-CM | POA: Diagnosis not present

## 2022-05-07 DIAGNOSIS — G4730 Sleep apnea, unspecified: Secondary | ICD-10-CM

## 2022-05-07 DIAGNOSIS — Z6841 Body Mass Index (BMI) 40.0 and over, adult: Secondary | ICD-10-CM

## 2022-05-07 DIAGNOSIS — E669 Obesity, unspecified: Secondary | ICD-10-CM

## 2022-05-07 DIAGNOSIS — I5032 Chronic diastolic (congestive) heart failure: Secondary | ICD-10-CM | POA: Diagnosis not present

## 2022-05-07 DIAGNOSIS — M519 Unspecified thoracic, thoracolumbar and lumbosacral intervertebral disc disorder: Secondary | ICD-10-CM | POA: Diagnosis not present

## 2022-05-07 DIAGNOSIS — Z1331 Encounter for screening for depression: Secondary | ICD-10-CM | POA: Insufficient documentation

## 2022-05-09 LAB — COMPREHENSIVE METABOLIC PANEL
ALT: 22 IU/L (ref 0–44)
AST: 23 IU/L (ref 0–40)
Albumin/Globulin Ratio: 1.7 (ref 1.2–2.2)
Albumin: 4.1 g/dL (ref 4.1–5.1)
Alkaline Phosphatase: 116 IU/L (ref 44–121)
BUN/Creatinine Ratio: 17 (ref 9–20)
BUN: 14 mg/dL (ref 6–24)
Bilirubin Total: 0.8 mg/dL (ref 0.0–1.2)
CO2: 23 mmol/L (ref 20–29)
Calcium: 9.5 mg/dL (ref 8.7–10.2)
Chloride: 100 mmol/L (ref 96–106)
Creatinine, Ser: 0.81 mg/dL (ref 0.76–1.27)
Globulin, Total: 2.4 g/dL (ref 1.5–4.5)
Glucose: 115 mg/dL — ABNORMAL HIGH (ref 70–99)
Potassium: 4.6 mmol/L (ref 3.5–5.2)
Sodium: 141 mmol/L (ref 134–144)
Total Protein: 6.5 g/dL (ref 6.0–8.5)
eGFR: 109 mL/min/{1.73_m2} (ref >59)

## 2022-05-09 LAB — CBC WITH DIFFERENTIAL/PLATELET
Basophils Absolute: 0 10*3/uL (ref 0.0–0.2)
Basos: 0 %
EOS (ABSOLUTE): 0.1 10*3/uL (ref 0.0–0.4)
Eos: 1 %
Hematocrit: 42.5 % (ref 37.5–51.0)
Hemoglobin: 14.2 g/dL (ref 13.0–17.7)
Immature Grans (Abs): 0 10*3/uL (ref 0.0–0.1)
Immature Granulocytes: 0 %
Lymphocytes Absolute: 2.3 10*3/uL (ref 0.7–3.1)
Lymphs: 31 %
MCH: 29.7 pg (ref 26.6–33.0)
MCHC: 33.4 g/dL (ref 31.5–35.7)
MCV: 89 fL (ref 79–97)
Monocytes Absolute: 0.5 10*3/uL (ref 0.1–0.9)
Monocytes: 7 %
Neutrophils Absolute: 4.5 10*3/uL (ref 1.4–7.0)
Neutrophils: 61 %
Platelets: 252 10*3/uL (ref 150–450)
RBC: 4.78 x10E6/uL (ref 4.14–5.80)
RDW: 13 % (ref 11.6–15.4)
WBC: 7.4 10*3/uL (ref 3.4–10.8)

## 2022-05-09 LAB — INSULIN, RANDOM: INSULIN: 34.6 u[IU]/mL — ABNORMAL HIGH (ref 2.6–24.9)

## 2022-05-09 LAB — T4, FREE: Free T4: 1.12 ng/dL (ref 0.82–1.77)

## 2022-05-09 LAB — HEMOGLOBIN A1C
Est. average glucose Bld gHb Est-mCnc: 123 mg/dL
Hgb A1c MFr Bld: 5.9 % — ABNORMAL HIGH (ref 4.8–5.6)

## 2022-05-09 LAB — LIPID PANEL
Chol/HDL Ratio: 3.1 ratio (ref 0.0–5.0)
Cholesterol, Total: 135 mg/dL (ref 100–199)
HDL: 44 mg/dL (ref >39)
LDL Chol Calc (NIH): 71 mg/dL (ref 0–99)
Triglycerides: 108 mg/dL (ref 0–149)
VLDL Cholesterol Cal: 20 mg/dL (ref 5–40)

## 2022-05-09 LAB — TSH: TSH: 2.09 u[IU]/mL (ref 0.450–4.500)

## 2022-05-09 LAB — VITAMIN B12: Vitamin B-12: 288 pg/mL (ref 232–1245)

## 2022-05-09 LAB — VITAMIN D 25 HYDROXY (VIT D DEFICIENCY, FRACTURES): Vit D, 25-Hydroxy: 32.4 ng/mL (ref 30.0–100.0)

## 2022-05-20 NOTE — Progress Notes (Signed)
Chief Complaint:   OBESITY PATE Jacob Hunt (MR# [id number]) is a 51 y.o. male who presents for evaluation and treatment of obesity and related comorbidities. Current BMI is Body mass index is 75.05 kg/m. Jacob Hunt has been struggling with his weight for many years and has been unsuccessful in either losing weight, maintaining weight loss, or reaching his healthy weight goal.  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  He has gained 200 lbs in 10 years.  Over ate in child hood and has a family history of obesity. Tends to skip meals.    Jacob Hunt is currently in the action stage of change and ready to dedicate time achieving and maintaining a healthier weight. Jacob Hunt is interested in becoming our patient and working on intensive lifestyle modifications including (but not limited to) diet and exercise for weight loss.  Jacob Hunt's habits were reviewed today and are as follows: His family eats meals together, he thinks his family will eat healthier with him, his desired weight loss is 235 lbs, he has been heavy most of his life, he started gaining weight about 13 years ago, his heaviest weight ever was 473 pounds, he is a picky eater and doesn't like to eat healthier foods, he has significant food cravings issues, he skips meals frequently, he is frequently drinking liquids with calories, he frequently eats larger portions than normal, and he struggles with emotional eating.  Depression Screen Jacob Hunt's Food and Mood (modified PHQ-9) score was 8.  Subjective:   1. Other fatigue EKG reviewed from June 2023, Dr Allyson Sabal.   Jacob Hunt admits to daytime somnolence and admits to waking up still tired. Patient has a history of symptoms of N/A. Jacob Hunt generally gets about 5-8  hours of sleep per night, and states that he has generally restful sleep. Snoring is present. Apneic episodes are not present. Epworth Sleepiness Score is 4.     2. SOBOE (shortness of breath on exertion) Jacob Hunt notes increasing shortness of breath with exercising and seems to be worsening over time with weight gain. He notes getting out of breath sooner with activity than he used to. This has not gotten worse recently. Jacob Hunt denies shortness of breath at rest or orthopnea.  3. Osteoarthritis of left knee, unspecified osteoarthritis type Left knee pain improving.  Has topical Votaren gel to use prn.   4. Lumbar disc disease History of herniated disc S1-2 with right sided radiculopathy. Has done PT. Pain and numbness limits walking.   5. Chronic diastolic heart failure (HCC) Managed by Dr Allyson Sabal on losartan, furosemide, aspirin.  Complains of bilateral leg edema, some dyspnea.   6. Sleep-disordered breathing High risk for OSA.  He snores heavily at night with poor sleep.  Home sleep study ordered 06/2021, but  not done.   Assessment/Plan:   1. Other fatigue Jacob Hunt does feel that his weight is causing his energy to be lower than it should be. Fatigue may be related to obesity, depression or many other causes. Labs will be ordered, and in the meanwhile, Jacob Hunt will focus on self care including making healthy food choices, increasing physical activity and focusing on stress reduction. Update labs today.   - VITAMIN D 25 Hydroxy (Vit-D Deficiency, Fractures) - TSH - T4, free - Lipid panel - Insulin, random - Hemoglobin A1c - Comprehensive metabolic panel - Vitamin B12 - CBC with Differential/Platelet  2. SOBOE (shortness of breath on exertion) Jacob Hunt does  feel that he gets out of breath more easily that he used to when he exercises. Jacob Hunt's shortness of breath appears to be obesity related and exercise induced. He has agreed to work on weight loss and gradually increase exercise to treat his exercise induced shortness of breath. Will continue to monitor closely.  3. Osteoarthritis of left knee, unspecified osteoarthritis type Look for  improvements with weight reduction.  Continue current medications per cardiology.    4. Lumbar disc disease Look for reduction in pain with weight loss and return to regular exercise.   5. Chronic diastolic heart failure (HCC) Look for improvements with weight reduction.  Continue current medications per cardiology.   6. Sleep-disordered breathing Referral to obtain sleep study.   Referral - Ambulatory referral to Neurology  7. Depression screen Jacob Hunt had a positive depression screening. Depression is commonly associated with obesity and often results in emotional eating behaviors. We will monitor this closely and work on CBT to help improve the non-hunger eating patterns. Referral to Psychology may be required if no improvement is seen as he continues in our clinic.  8. Obesity,current BMI 75.1 Jacob Hunt is currently in the action stage of change and his goal is to continue with weight loss efforts. I recommend Jacob Hunt begin the structured treatment plan as follows:  He has agreed to {MWMwtlossportion/plan2:23431}.  Exercise goals: {MWM EXERCISE RECS:23473}   Behavioral modification strategies: increasing lean protein intake, increasing vegetables, increasing water intake, decreasing eating out, no skipping meals, meal planning and cooking strategies, keeping healthy foods in the home, and decreasing junk food.  He was informed of the importance of frequent follow-up visits to maximize his success with intensive lifestyle modifications for his multiple health conditions. He was informed we would discuss his lab results at his next visit unless there is a critical issue that needs to be addressed sooner. Jacob Hunt agreed to keep his next visit at the agreed upon time to discuss these results.  Objective:   Blood pressure 132/77, pulse 97, temperature 97.7 F (36.5 C), height 5\' 6"  (1.676 m), weight (!) 465 lb (210.9 kg), SpO2 98 %. Body mass index is 75.05 kg/m.  EKG: Normal sinus rhythm,  rate 65 BPM.  Indirect Calorimeter completed today shows a VO2 of 376 and a REE of 2592.  His calculated basal metabolic rate is thus his basal metabolic rate is worse than expected.  General: Cooperative, alert, well developed, in no acute distress. HEENT: Conjunctivae and lids unremarkable. Cardiovascular: Regular rhythm.  Lungs: Normal work of breathing. Neurologic: No focal deficits.   Lab Results  Component Value Date   CREATININE 0.81 05/07/2022   BUN 14 05/07/2022   NA 141 05/07/2022   K 4.6 05/07/2022   CL 100 05/07/2022   CO2 23 05/07/2022   Lab Results  Component Value Date   ALT 22 05/07/2022   AST 23 05/07/2022   ALKPHOS 116 05/07/2022   BILITOT 0.8 05/07/2022   Lab Results  Component Value Date   HGBA1C 5.9 (H) 05/07/2022   HGBA1C 5.6 05/03/2017   Lab Results  Component Value Date   INSULIN 34.6 (H) 05/07/2022   Lab Results  Component Value Date   TSH 2.090 05/07/2022   Lab Results  Component Value Date   CHOL 135 05/07/2022   HDL 44 05/07/2022   LDLCALC 71 05/07/2022   TRIG 108 05/07/2022   CHOLHDL 3.1 05/07/2022   Lab Results  Component Value Date   WBC 7.4 05/07/2022   HGB 14.2  05/07/2022   HCT 42.5 05/07/2022   MCV 89 05/07/2022   PLT 252 05/07/2022   No results found for: "IRON", "TIBC", "FERRITIN"   Attestation Statements:   Reviewed by clinician on day of visit: allergies, medications, problem list, medical history, surgical history, family history, social history, and previous encounter notes.  I, Malcolm Metro, am acting as Energy manager for Seymour Bars, DO.  I have reviewed the above documentation for accuracy and completeness, and I agree with the above. - ***

## 2022-05-21 ENCOUNTER — Encounter (INDEPENDENT_AMBULATORY_CARE_PROVIDER_SITE_OTHER): Payer: Self-pay | Admitting: Family Medicine

## 2022-05-21 ENCOUNTER — Ambulatory Visit (INDEPENDENT_AMBULATORY_CARE_PROVIDER_SITE_OTHER): Payer: 59 | Admitting: Family Medicine

## 2022-05-21 VITALS — BP 130/89 | HR 74 | Temp 97.9°F | Ht 66.0 in | Wt >= 6400 oz

## 2022-05-21 DIAGNOSIS — E559 Vitamin D deficiency, unspecified: Secondary | ICD-10-CM

## 2022-05-21 DIAGNOSIS — E88819 Insulin resistance, unspecified: Secondary | ICD-10-CM

## 2022-05-21 DIAGNOSIS — R7303 Prediabetes: Secondary | ICD-10-CM | POA: Diagnosis not present

## 2022-05-21 DIAGNOSIS — Z6841 Body Mass Index (BMI) 40.0 and over, adult: Secondary | ICD-10-CM

## 2022-05-21 DIAGNOSIS — E66813 Obesity, class 3: Secondary | ICD-10-CM

## 2022-05-21 DIAGNOSIS — E669 Obesity, unspecified: Secondary | ICD-10-CM

## 2022-05-21 DIAGNOSIS — E7849 Other hyperlipidemia: Secondary | ICD-10-CM | POA: Diagnosis not present

## 2022-05-21 DIAGNOSIS — R7989 Other specified abnormal findings of blood chemistry: Secondary | ICD-10-CM

## 2022-05-21 MED ORDER — VITAMIN D (ERGOCALCIFEROL) 1.25 MG (50000 UNIT) PO CAPS: 50000.0000 [IU] | ORAL_CAPSULE | ORAL | 0 refills | Status: DC

## 2022-06-02 NOTE — Progress Notes (Signed)
Chief Complaint:   OBESITY Jacob Hunt is here to discuss his progress with his obesity treatment plan along with follow-up of his obesity related diagnoses. [name] is on the Category 4 Plan and states he is following his eating plan approximately 60% of the time. [name] states he is not exercising.   Today's visit was #: 2 Starting weight: 465 lbs Starting date: 05/07/2022 Today's weight: 462 lbs Today's date: 05/21/2022 Total lbs lost to date: 3 lbs Total lbs lost since last in-office visit: 3 lbs  Interim History: Liking some of the foods on the meal plan.  Has not tried greek yogurt.  Denies night time snacks or cravings at night.  Has had headaches trying to cut out [facility].  Drinks a lot of sweet tea.  Increased water intake.   Subjective:   1. Vitamin D deficiency Discussed labs with patient today. He is currently taking OTC vitamin D 5,000 IU each day. He denies nausea, vomiting or muscle weakness, but complains of fatigue. Vitamin D level 32.4.    2. Other hyperlipidemia Discussed labs with patient today. FLP much improved back on Atorvastatin 80 mg nightly and Lovaza 2 grams daily per PCP.   3. Insulin resistance Discussed labs with patient today. Fasting insulin 34.6, 05/07/2022. Was consuming high amount of SSB's, candy, has reduced.    4. Pre-diabetes Discussed labs with patient today. A1c 5.9.  Positive family history of T2DM.  High risk for T2DM with BMI 75.   5. Low vitamin B12 level Discussed labs with patient today. B12 level low normal at 288, 11/2.   Assessment/Plan:   1. Vitamin D deficiency Discontinue OTC Vitamin D.  Recheck level 3-4 months.   Begin - Vitamin D, Ergocalciferol, (DRISDOL) 1.25 MG (50000 UNIT) CAPS capsule; Take 1 capsule (50,000 Units total) by mouth every 7 (seven) days.  Dispense: 5 capsule; Refill: 0  2. Other hyperlipidemia Continue current medications and heart healthy diet.   3. Insulin resistance Continue to work  on reducing sugar, increasing walking, consider metformin.   4. Pre-diabetes Recheck A1c 3-4 months following active weight reduction.   5. Low vitamin B12 level Begin a Men's multivitamin daily.   6. Obesity,current BMI 74.7 1) Reviewed alternative snack options.   2) Reviewed comparable bread options, cost, calories. 3) Continue to reduce SSB's.   [name] is currently in the action stage of change. As such, his goal is to continue with weight loss efforts. He has agreed to the Category 4 Plan.   Exercise goals:  Walking 10 minutes per day.   Behavioral modification strategies: increasing lean protein intake, increasing vegetables, increasing water intake, decreasing liquid calories, no skipping meals, keeping healthy foods in the home, holiday eating strategies , and decreasing junk food.  [name] has agreed to follow-up with our clinic in 3 weeks. He was informed of the importance of frequent follow-up visits to maximize his success with intensive lifestyle modifications for his multiple health conditions.   Objective:   Blood pressure 130/89, pulse 74, temperature 97.9 F (36.6 C), height 5\' 6"  (1.676 m), weight (!) 462 lb (209.6 kg), SpO2 95 %. Body mass index is 74.57 kg/m.  General: Cooperative, alert, well developed, in no acute distress. HEENT: Conjunctivae and lids unremarkable. Cardiovascular: Regular rhythm.  Lungs: Normal work of breathing. Neurologic: No focal deficits.   Lab Results  Component Value Date   CREATININE 0.81 05/07/2022   BUN 14 05/07/2022   NA 141 05/07/2022   K 4.6 05/07/2022  CL 100 05/07/2022   CO2 23 05/07/2022   Lab Results  Component Value Date   ALT 22 05/07/2022   AST 23 05/07/2022   ALKPHOS 116 05/07/2022   BILITOT 0.8 05/07/2022   Lab Results  Component Value Date   HGBA1C 5.9 (H) 05/07/2022   HGBA1C 5.6 05/03/2017   Lab Results  Component Value Date   INSULIN 34.6 (H) 05/07/2022   Lab Results  Component Value Date    TSH 2.090 05/07/2022   Lab Results  Component Value Date   CHOL 135 05/07/2022   HDL 44 05/07/2022   LDLCALC 71 05/07/2022   TRIG 108 05/07/2022   CHOLHDL 3.1 05/07/2022   Lab Results  Component Value Date   VD25OH 32.4 05/07/2022   Lab Results  Component Value Date   WBC 7.4 05/07/2022   HGB 14.2 05/07/2022   HCT 42.5 05/07/2022   MCV 89 05/07/2022   PLT 252 05/07/2022   No results found for: "IRON", "TIBC", "FERRITIN"  Attestation Statements:   Reviewed by clinician on day of visit: allergies, medications, problem list, medical history, surgical history, family history, social history, and previous encounter notes.  Time spent on visit including pre-visit chart review and post-visit care and charting was 40 minutes.   I, Malcolm Metro, am acting as Energy manager for Seymour Bars, DO.  I have reviewed the above documentation for accuracy and completeness, and I agree with the above. Glennis Brink, DO

## 2022-06-11 ENCOUNTER — Ambulatory Visit (INDEPENDENT_AMBULATORY_CARE_PROVIDER_SITE_OTHER): Payer: 59 | Admitting: Family Medicine

## 2022-06-11 ENCOUNTER — Encounter (INDEPENDENT_AMBULATORY_CARE_PROVIDER_SITE_OTHER): Payer: Self-pay | Admitting: Family Medicine

## 2022-06-11 VITALS — BP 118/75 | HR 71 | Temp 97.8°F | Ht 66.0 in | Wt >= 6400 oz

## 2022-06-11 DIAGNOSIS — G4730 Sleep apnea, unspecified: Secondary | ICD-10-CM | POA: Diagnosis not present

## 2022-06-11 DIAGNOSIS — E559 Vitamin D deficiency, unspecified: Secondary | ICD-10-CM

## 2022-06-11 DIAGNOSIS — R7303 Prediabetes: Secondary | ICD-10-CM | POA: Diagnosis not present

## 2022-06-11 DIAGNOSIS — R7989 Other specified abnormal findings of blood chemistry: Secondary | ICD-10-CM

## 2022-06-11 DIAGNOSIS — E669 Obesity, unspecified: Secondary | ICD-10-CM

## 2022-06-11 DIAGNOSIS — Z6841 Body Mass Index (BMI) 40.0 and over, adult: Secondary | ICD-10-CM

## 2022-06-11 DIAGNOSIS — E66813 Obesity, class 3: Secondary | ICD-10-CM

## 2022-06-11 MED ORDER — VITAMIN D (ERGOCALCIFEROL) 1.25 MG (50000 UNIT) PO CAPS: 50000.0000 [IU] | ORAL_CAPSULE | ORAL | 0 refills | Status: DC

## 2022-06-11 MED ORDER — METFORMIN HCL 500 MG PO TABS: 500.0000 mg | ORAL_TABLET | Freq: Every day | ORAL | 0 refills | Status: DC

## 2022-06-18 NOTE — Progress Notes (Addendum)
Chief Complaint:   OBESITY Jacob Hunt is here to discuss his progress with his obesity treatment plan along with follow-up of his obesity related diagnoses. [name] is on the Category 4 Plan and states he is following his eating plan approximately 60% of the time. [name] states he is exercising.  Today's visit was #: 3 Starting weight: 465 LBS Starting date: 05/07/2022 Today's weight: 462 LBS Today's date: 06/11/2022 Total lbs lost to date: 3 LBS Total lbs lost since last in-office visit: 0  Interim History: Patient had the flu since last visit.  Cut back drastically on [facility] and sweet tea.  He has increased water intake.  He has not started workouts.  Net weight loss 3 pounds in 1 month.  Subjective:   1. Vitamin D deficiency Patient is on prescription vitamin D 50,000 IU weekly.  Last vitamin D level was 32.4 on 05/07/2022.  2. Pre-diabetes Last A1c was 5.9.  Fasting insulin 34.6.  Consumes a high level of sugar, reducing intake.  Positive family history of diabetes.  3. Low vitamin B12 level B12 level 288 on 11/22.  He is not on a B12 or multivitamin supplement.  Denies paraesthesias.  4. Sleep-disordered breathing Awaiting sleep study.  Had to postpone due to having the flu.  Assessment/Plan:   1. Vitamin D deficiency Recheck vitamin D  level in February.  Refill- Vitamin D, Ergocalciferol, (DRISDOL) 1.25 MG (50000 UNIT) CAPS capsule; Take 1 capsule (50,000 Units total) by mouth every 7 (seven) days.  Dispense: 5 capsule; Refill: 0  2. Pre-diabetes Begin- metFORMIN (GLUCOPHAGE) 500 MG tablet; Take 1 tablet (500 mg total) by mouth daily with breakfast.  Dispense: 30 tablet; Refill: 0; reviewed potential adverse SEs  3. Low vitamin B12 level Begin over-the-counter B12 500 mcg daily.  4. Sleep-disordered breathing Follow-up with results of polysomnogram.  5. Obesity,current BMI 74.6 1.  Reduce sodas and sweet tea down to 1 or less per week. 2.  Add evening  walks tracking daily steps  Jacob Hunt is currently in the action stage of change. As such, his goal is to continue with weight loss efforts. He has agreed to the Category 4 Plan.   Exercise goals: Recommend tracking steps and adding in short walks.  Behavioral modification strategies: increasing lean protein intake, increasing vegetables, increasing water intake, decreasing eating out, meal planning and cooking strategies, keeping healthy foods in the home, better snacking choices, and holiday eating strategies .  Jacob Hunt has agreed to follow-up with our clinic in 4 weeks. He was informed of the importance of frequent follow-up visits to maximize his success with intensive lifestyle modifications for his multiple health conditions.   Objective:   Blood pressure 118/75, pulse 71, temperature 97.8 F (36.6 C), height 5\' 6"  (1.676 m), weight (!) 462 lb (209.6 kg), SpO2 95 %. Body mass index is 74.57 kg/m.  General: Cooperative, alert, well developed, in no acute distress. HEENT: Conjunctivae and lids unremarkable. Cardiovascular: Regular rhythm.  Lungs: Normal work of breathing. Neurologic: No focal deficits.   Lab Results  Component Value Date   CREATININE 0.81 05/07/2022   BUN 14 05/07/2022   NA 141 05/07/2022   K 4.6 05/07/2022   CL 100 05/07/2022   CO2 23 05/07/2022   Lab Results  Component Value Date   ALT 22 05/07/2022   AST 23 05/07/2022   ALKPHOS 116 05/07/2022   BILITOT 0.8 05/07/2022   Lab Results  Component Value Date   HGBA1C 5.9 (H) 05/07/2022  HGBA1C 5.6 05/03/2017   Lab Results  Component Value Date   INSULIN 34.6 (H) 05/07/2022   Lab Results  Component Value Date   TSH 2.090 05/07/2022   Lab Results  Component Value Date   CHOL 135 05/07/2022   HDL 44 05/07/2022   LDLCALC 71 05/07/2022   TRIG 108 05/07/2022   CHOLHDL 3.1 05/07/2022   Lab Results  Component Value Date   VD25OH 32.4 05/07/2022   Lab Results  Component Value Date   WBC 7.4  05/07/2022   HGB 14.2 05/07/2022   HCT 42.5 05/07/2022   MCV 89 05/07/2022   PLT 252 05/07/2022   No results found for: "IRON", "TIBC", "FERRITIN"  Attestation Statements:   Reviewed by clinician on day of visit: allergies, medications, problem list, medical history, surgical history, family history, social history, and previous encounter notes.  I, Davy Pique, am acting as Location manager for Loyal Gambler, DO.  I have reviewed the above documentation for accuracy and completeness, and I agree with the above. Dell Ponto, DO

## 2022-07-16 ENCOUNTER — Encounter (INDEPENDENT_AMBULATORY_CARE_PROVIDER_SITE_OTHER): Payer: Self-pay | Admitting: Family Medicine

## 2022-07-16 ENCOUNTER — Ambulatory Visit (INDEPENDENT_AMBULATORY_CARE_PROVIDER_SITE_OTHER): Payer: 59 | Admitting: Family Medicine

## 2022-07-16 VITALS — BP 138/84 | HR 79 | Temp 97.7°F | Ht 66.0 in | Wt >= 6400 oz

## 2022-07-16 DIAGNOSIS — E88819 Insulin resistance, unspecified: Secondary | ICD-10-CM

## 2022-07-16 DIAGNOSIS — R7303 Prediabetes: Secondary | ICD-10-CM | POA: Diagnosis not present

## 2022-07-16 DIAGNOSIS — E559 Vitamin D deficiency, unspecified: Secondary | ICD-10-CM | POA: Diagnosis not present

## 2022-07-16 DIAGNOSIS — Z6841 Body Mass Index (BMI) 40.0 and over, adult: Secondary | ICD-10-CM

## 2022-07-16 DIAGNOSIS — G4730 Sleep apnea, unspecified: Secondary | ICD-10-CM

## 2022-07-16 DIAGNOSIS — E669 Obesity, unspecified: Secondary | ICD-10-CM

## 2022-07-16 DIAGNOSIS — E538 Deficiency of other specified B group vitamins: Secondary | ICD-10-CM | POA: Insufficient documentation

## 2022-07-16 MED ORDER — METFORMIN HCL 500 MG PO TABS: 500.0000 mg | ORAL_TABLET | Freq: Every day | ORAL | 0 refills | Status: DC

## 2022-07-16 MED ORDER — VITAMIN D (ERGOCALCIFEROL) 1.25 MG (50000 UNIT) PO CAPS: 50000.0000 [IU] | ORAL_CAPSULE | ORAL | 0 refills | Status: DC

## 2022-08-10 NOTE — Progress Notes (Unsigned)
Chief Complaint:   OBESITY Jacob Hunt is here to discuss his progress with his obesity treatment plan along with follow-up of his obesity related diagnoses. Jasir is on the Category 4 Plan and states he is following his eating plan approximately 40% of the time. Robson states he is not exercising.  Today's visit was #: 4 Starting weight: 55 LBS Starting date: 05/07/2022 Today's weight: 458 LBS Today's date: 07/16/2022 Total lbs lost to date: 7 LBS Total lbs lost since last in-office visit: 4 LBS  Interim History: Patient is back on meal plan post holidays.  His dad was in the hospital before Christmas.  He was eating out more.  He has a good support system at home.  He plans to start at the gym.  He has been working 9 hours 5 to 6 days/week, but it will get better and of January.  He brings his lunches to work.  Subjective:   1. Sleep-disordered breathing Patient has been referred to sleep medicine but has not scheduled.  He had the flu and his dad was in the hospital.  Patient snores heavily.  2. B12 deficiency Last B12 was 288 on 05/07/2022.  Patient started OTC B12 500 mcg daily.  Energy level starting to improve.  Patient denies paresthesias.  3. Vitamin D deficiency Last vitamin D level was 32.4 on 05/07/2022.  Patient is taking prescription vitamin D 50,000 IU weekly.  Patient denies nausea, vomiting, muscle aches.  4. Insulin resistance Fasting insulin 34.6 on 05/07/2022.  Patient is working on reducing high intake of sugar.  5. Pre-diabetes Patient started metformin 500 mg daily, he forgets to take.  When he takes that he takes it with food.  Patient has had some loose stools.  Last A1c was 5.9.  Assessment/Plan:   1. Sleep-disordered breathing Patient agrees to schedule sleep study has the phone number.  2. B12 deficiency Continue OTC vitamin B12 500 mcg daily.  Recheck level in 3 months.  3. Vitamin D deficiency Recheck vitamin D level in 2 to 3 months.  Refill-  Vitamin D, Ergocalciferol, (DRISDOL) 1.25 MG (50000 UNIT) CAPS capsule; Take 1 capsule (50,000 Units total) by mouth every 7 (seven) days.  Dispense: 5 capsule; Refill: 0  4. Insulin resistance Adding gym workouts at least 2 days/week.  5. Pre-diabetes Patient can move metformin 500 mg daily to dinner.  Recheck BMP, A1c in the next 2 to 3 months.  Refill- metFORMIN (GLUCOPHAGE) 500 MG tablet; Take 1 tablet (500 mg total) by mouth daily with breakfast.  Dispense: 30 tablet; Refill: 0  6. Obesity,current BMI 74.0 1.  Begin [employer] workouts 2+ days a week. 2.  Reduce intake of SSB's. 3.  Carry 2 healthy snacks to work.  Jamarion is currently in the action stage of change. As such, his goal is to continue with weight loss efforts. He has agreed to the Category 4 Plan.   Exercise goals: All adults should avoid inactivity. Some physical activity is better than none, and adults who participate in any amount of physical activity gain some health benefits.  Behavioral modification strategies: increasing lean protein intake, increasing water intake, decreasing eating out, no skipping meals, meal planning and cooking strategies, keeping healthy foods in the home, and decreasing junk food.  Chevy has agreed to follow-up with our clinic in 4 weeks. He was informed of the importance of frequent follow-up visits to maximize his success with intensive lifestyle modifications for his multiple health conditions.   Objective:  Blood pressure 138/84, pulse 79, temperature 97.7 F (36.5 C), height 5\' 6"  (1.676 m), weight (!) 458 lb (207.7 kg), SpO2 96 %. Body mass index is 73.92 kg/m.  General: Cooperative, alert, well developed, in no acute distress. HEENT: Conjunctivae and lids unremarkable. Cardiovascular: Regular rhythm.  Lungs: Normal work of breathing. Neurologic: No focal deficits.   Lab Results  Component Value Date   CREATININE 0.81 05/07/2022   BUN 14 05/07/2022   NA 141 05/07/2022   K 4.6  05/07/2022   CL 100 05/07/2022   CO2 23 05/07/2022   Lab Results  Component Value Date   ALT 22 05/07/2022   AST 23 05/07/2022   ALKPHOS 116 05/07/2022   BILITOT 0.8 05/07/2022   Lab Results  Component Value Date   HGBA1C 5.9 (H) 05/07/2022   HGBA1C 5.6 05/03/2017   Lab Results  Component Value Date   INSULIN 34.6 (H) 05/07/2022   Lab Results  Component Value Date   TSH 2.090 05/07/2022   Lab Results  Component Value Date   CHOL 135 05/07/2022   HDL 44 05/07/2022   LDLCALC 71 05/07/2022   TRIG 108 05/07/2022   CHOLHDL 3.1 05/07/2022   Lab Results  Component Value Date   VD25OH 32.4 05/07/2022   Lab Results  Component Value Date   WBC 7.4 05/07/2022   HGB 14.2 05/07/2022   HCT 42.5 05/07/2022   MCV 89 05/07/2022   PLT 252 05/07/2022   No results found for: "IRON", "TIBC", "FERRITIN"  Attestation Statements:   Reviewed by clinician on day of visit: allergies, medications, problem list, medical history, surgical history, family history, social history, and previous encounter notes.  I, Davy Pique, am acting as Location manager for Loyal Gambler, DO.  I have reviewed the above documentation for accuracy and completeness, and I agree with the above. Dell Ponto, DO

## 2022-08-20 ENCOUNTER — Ambulatory Visit (INDEPENDENT_AMBULATORY_CARE_PROVIDER_SITE_OTHER): Payer: 59 | Admitting: Family Medicine

## 2022-08-20 ENCOUNTER — Encounter (INDEPENDENT_AMBULATORY_CARE_PROVIDER_SITE_OTHER): Payer: Self-pay | Admitting: Family Medicine

## 2022-08-20 VITALS — BP 125/84 | HR 73 | Temp 97.9°F | Ht 66.0 in | Wt >= 6400 oz

## 2022-08-20 DIAGNOSIS — R7303 Prediabetes: Secondary | ICD-10-CM

## 2022-08-20 DIAGNOSIS — E559 Vitamin D deficiency, unspecified: Secondary | ICD-10-CM

## 2022-08-20 DIAGNOSIS — Z6841 Body Mass Index (BMI) 40.0 and over, adult: Secondary | ICD-10-CM | POA: Diagnosis not present

## 2022-08-20 DIAGNOSIS — E88819 Insulin resistance, unspecified: Secondary | ICD-10-CM

## 2022-08-20 MED ORDER — VITAMIN D (ERGOCALCIFEROL) 1.25 MG (50000 UNIT) PO CAPS: 50000.0000 [IU] | ORAL_CAPSULE | ORAL | 0 refills | Status: DC

## 2022-08-20 MED ORDER — METFORMIN HCL 500 MG PO TABS: 500.0000 mg | ORAL_TABLET | Freq: Every day | ORAL | 0 refills | Status: DC

## 2022-08-20 NOTE — Progress Notes (Signed)
Office: [phone]  /  Fax: [phone]  WEIGHT SUMMARY AND BIOMETRICS  Medical Weight Loss Height: 5' 6"$  (1.676 m) Weight: 458 lb (207.7 kg) Temp: 97.9 F (36.6 C) Pulse Rate: 73 BP: 125/84 SpO2: 96 % Fasting: no Labs: no Today's Visit #: 5 Weight at Last VIsit: 458 Weight Lost Since Last Visit: 0  Body Fat %: 58.3 % Fat Mass (lbs): 267.7 lbs Muscle Mass (lbs): 181.6 lbs Visceral Fat Rating : 59    HPI  Chief Complaint: OBESITY  Jacob Hunt is here to discuss his progress with his obesity treatment plan. He is on the the Category 4 Plan and states he is following his eating plan approximately 20 % of the time. He states he is exercising 0 minutes 0 times per week.   Interval History:  Since last office visit he is down 0 lb He has been working more which has lead to skipping meals and eating dinner later Eating out more.  Food choices have not been as healthy.  Denies excess hunger.  Drinking more Coke and sweet tea.  Sleeping well at night, awaiting sleep study.  Plans to start going to the gym.  Pharmacotherapy: none   PHYSICAL EXAM:  Blood pressure 125/84, pulse 73, temperature 97.9 F (36.6 C), height 5' 6"$  (1.676 m), weight (!) 458 lb (207.7 kg), SpO2 96 %. Body mass index is 73.92 kg/m.  General: He is overweight, cooperative, alert, well developed, and in no acute distress. PSYCH: Has normal mood, affect and thought process.   HEENT: EOMI, sclerae are anicteric. Lungs: Normal breathing effort, no conversational dyspnea. Extremities: No edema.  Neurologic: No gross sensory or motor deficits. No tremors or fasciculations noted.    DIAGNOSTIC DATA REVIEWED:  BMET    Component Value Date/Time   NA 141 05/07/2022 0911   K 4.6 05/07/2022 0911   CL 100 05/07/2022 0911   CO2 23 05/07/2022 0911   GLUCOSE 115 (H) 05/07/2022 0911   GLUCOSE 118 (H) 03/15/2017 1139   BUN 14 05/07/2022 0911   CREATININE 0.81 05/07/2022 0911   CALCIUM 9.5 05/07/2022 0911    GFRNONAA 103 04/08/2020 0747   GFRAA 119 04/08/2020 0747   Lab Results  Component Value Date   HGBA1C 5.9 (H) 05/07/2022   HGBA1C 5.6 05/03/2017   Lab Results  Component Value Date   INSULIN 34.6 (H) 05/07/2022   Lab Results  Component Value Date   TSH 2.090 05/07/2022   CBC    Component Value Date/Time   WBC 7.4 05/07/2022 0911   WBC 8.2 03/16/2017 0132   RBC 4.78 05/07/2022 0911   RBC 4.56 03/16/2017 0132   HGB 14.2 05/07/2022 0911   HCT 42.5 05/07/2022 0911   PLT 252 05/07/2022 0911   MCV 89 05/07/2022 0911   MCH 29.7 05/07/2022 0911   MCH 30.3 03/16/2017 0132   MCHC 33.4 05/07/2022 0911   MCHC 34.1 03/16/2017 0132   RDW 13.0 05/07/2022 0911   Iron Studies No results found for: "IRON", "TIBC", "FERRITIN", "IRONPCTSAT" Lipid Panel     Component Value Date/Time   CHOL 135 05/07/2022 0911   TRIG 108 05/07/2022 0911   HDL 44 05/07/2022 0911   CHOLHDL 3.1 05/07/2022 0911   CHOLHDL 5.2 03/16/2017 0132   VLDL 47 (H) 03/16/2017 0132   LDLCALC 71 05/07/2022 0911   Hepatic Function Panel     Component Value Date/Time   PROT 6.5 05/07/2022 0911   ALBUMIN 4.1 05/07/2022 0911   AST 23 05/07/2022  0911   ALT 22 05/07/2022 0911   ALKPHOS 116 05/07/2022 0911   BILITOT 0.8 05/07/2022 0911   BILIDIR 0.13 12/09/2021 1458      Component Value Date/Time   TSH 2.090 05/07/2022 0911   Nutritional Lab Results  Component Value Date   VD25OH 32.4 05/07/2022     ASSESSMENT AND PLAN  TREATMENT PLAN FOR OBESITY:  Recommended Dietary Goals  Jacob Hunt is currently in the action stage of change. As such, his goal is to continue weight management plan. He has agreed to the Category 4 Plan.  Behavioral Intervention  We discussed the following Behavioral Modification Strategies today: increasing lean protein intake, increasing vegetables, increase water intake, work on meal planning and easy cooking plans, and think about ways to increase physical activity.  Additional  resources provided today: NA  Recommended Physical Activity Goals  Jacob Hunt has been advised to work up to 150 minutes of moderate intensity aerobic activity a week and strengthening exercises 2-3 times per week for cardiovascular health, weight loss maintenance and preservation of muscle mass.   He has agreed to Patient also encouraged on scheduling and tracking physical activity.    Pharmacotherapy We discussed various medication options to help Jacob Hunt with his weight loss efforts and we both agreed to continuing metformin.  ASSOCIATED CONDITIONS ADDRESSED TODAY  Pre-diabetes Assessment & Plan: Lab Results  Component Value Date   HGBA1C 5.9 (H) 05/07/2022    Pt is high risk for T2DM with BMI 73. He is working on prescribed dietary plan, reducing sugar intake We discussed increasing physical activity as work schedule improves  Begin metformin 500 mg once daily. Repeat BMP, B12 A1c in 3-4 mos.  Orders: -     metFORMIN HCl; Take 1 tablet (500 mg total) by mouth daily with breakfast.  Dispense: 30 tablet; Refill: 0  Vitamin D deficiency Assessment & Plan: Taking RX vitamin D 50,000 IU weekly Energy level is improving Denies GI side effects  Last vitamin D Lab Results  Component Value Date   VD25OH 32.4 05/07/2022     Orders: -     Vitamin D (Ergocalciferol); Take 1 capsule (50,000 Units total) by mouth every 7 (seven) days.  Dispense: 5 capsule; Refill: 0  Morbid obesity ([place])  BMI 70 and over, adult ([place])  Insulin resistance      No follow-ups on file.Marland Kitchen He was informed of the importance of frequent follow up visits to maximize his success with intensive lifestyle modifications for his multiple health conditions.   ATTESTASTION STATEMENTS:  Reviewed by clinician on day of visit: allergies, medications, problem list, medical history, surgical history, family history, social history, and previous encounter notes.   Time spent on visit including pre-visit chart  review and post-visit care and charting was 30 minutes.    Dell Ponto, DO

## 2022-08-21 NOTE — Assessment & Plan Note (Signed)
Taking RX vitamin D 50,000 IU weekly Energy level is improving Denies GI side effects  Last vitamin D Lab Results  Component Value Date   VD25OH 32.4 05/07/2022

## 2022-08-21 NOTE — Assessment & Plan Note (Signed)
Fasting insulin 34.6 05/07/22 Working on prescribed meal plan Plan to add in more consistent physical activity  Begin metformin for IR and PDM.

## 2022-08-21 NOTE — Assessment & Plan Note (Signed)
Lab Results  Component Value Date   HGBA1C 5.9 (H) 05/07/2022    Pt is high risk for T2DM with BMI 73. He is working on prescribed dietary plan, reducing sugar intake We discussed increasing physical activity as work schedule improves  Begin metformin 500 mg once daily. Repeat BMP, B12 A1c in 3-4 mos.

## 2022-09-20 ENCOUNTER — Other Ambulatory Visit (INDEPENDENT_AMBULATORY_CARE_PROVIDER_SITE_OTHER): Payer: Self-pay | Admitting: Family Medicine

## 2022-09-20 DIAGNOSIS — E559 Vitamin D deficiency, unspecified: Secondary | ICD-10-CM

## 2022-09-29 ENCOUNTER — Other Ambulatory Visit (INDEPENDENT_AMBULATORY_CARE_PROVIDER_SITE_OTHER): Payer: Self-pay | Admitting: Family Medicine

## 2022-09-29 DIAGNOSIS — R7303 Prediabetes: Secondary | ICD-10-CM

## 2022-09-29 DIAGNOSIS — E559 Vitamin D deficiency, unspecified: Secondary | ICD-10-CM

## 2022-10-20 ENCOUNTER — Encounter (INDEPENDENT_AMBULATORY_CARE_PROVIDER_SITE_OTHER): Payer: Self-pay | Admitting: Family Medicine

## 2022-10-20 ENCOUNTER — Ambulatory Visit (INDEPENDENT_AMBULATORY_CARE_PROVIDER_SITE_OTHER): Payer: 59 | Admitting: Family Medicine

## 2022-10-20 VITALS — BP 129/85 | HR 62 | Temp 97.8°F | Ht 66.0 in | Wt >= 6400 oz

## 2022-10-20 DIAGNOSIS — G4730 Sleep apnea, unspecified: Secondary | ICD-10-CM

## 2022-10-20 DIAGNOSIS — R7303 Prediabetes: Secondary | ICD-10-CM

## 2022-10-20 DIAGNOSIS — E559 Vitamin D deficiency, unspecified: Secondary | ICD-10-CM | POA: Diagnosis not present

## 2022-10-20 DIAGNOSIS — Z6841 Body Mass Index (BMI) 40.0 and over, adult: Secondary | ICD-10-CM

## 2022-10-20 MED ORDER — VITAMIN D (ERGOCALCIFEROL) 1.25 MG (50000 UNIT) PO CAPS: 50000.0000 [IU] | ORAL_CAPSULE | ORAL | 0 refills | Status: DC

## 2022-10-20 MED ORDER — METFORMIN HCL 500 MG PO TABS: 500.0000 mg | ORAL_TABLET | Freq: Every day | ORAL | 0 refills | Status: DC

## 2022-10-20 NOTE — Assessment & Plan Note (Signed)
2 lb of weight loss in the past 5 mos with fair compliance on prescribed meal plan High levels of fatigue have limited his exercise and he has lacked much motivation to make changes Wife has recently been working on weight loss, so this has been helpful  Consider use of a GLP-1 receptor agonist (cost barrier) Consider increasing metformin based on today's labs Follow up results of sleep study Gym or outdoor walking 15 min 3+ days/ wk  Consider bariatric surgery given high BMI

## 2022-10-20 NOTE — Progress Notes (Signed)
Office: [phone]  /  Fax: [phone]  WEIGHT SUMMARY AND BIOMETRICS  Starting Date: 05/07/22  Starting Weight: 465lb   Weight Lost Since Last Visit: 0   Vitals Temp: 97.8 F (36.6 C) BP: 129/85 Pulse Rate: 62 SpO2: 97 %   Body Composition  Body Fat %: 58.7 % Fat Mass (lbs): 272.2 lbs Muscle Mass (lbs): 182 lbs Visceral Fat Rating : 59   HPI  Chief Complaint: OBESITY  Jacob Hunt is here to discuss his progress with his obesity treatment plan. He is on the the Category 4 Plan and states he is following his eating plan approximately 45 % of the time. He states he is exercising 0 minutes 0 times per week.   Interval History:  Since last office visit he is up 5 lb He is often forgetting to metformin  He has a net weight loss of 2 lb in the past 5 mos Sleep study--hasn't done yet Ran out of RX D, forgetting metformin Lunch handout options and additional recipe handouts given   Pharmacotherapy: metformin  PHYSICAL EXAM:  Blood pressure 129/85, pulse 62, temperature 97.8 F (36.6 C), height  (1.676 m), weight (!) 463 lb (210 kg), SpO2 97 %. Body mass index is 74.73 kg/m.  General: He is overweight, cooperative, alert, well developed, and in no acute distress. PSYCH: Has normal mood, affect and thought process.   Lungs: Normal breathing effort, no conversational dyspnea.   ASSESSMENT AND PLAN  TREATMENT PLAN FOR OBESITY:  Recommended Dietary Goals  [name] is currently in the action stage of change. As such, his goal is to continue weight management plan. He has agreed to the Category 4 Plan.  Behavioral Intervention  We discussed the following Behavioral Modification Strategies today: increasing vegetables, increasing fiber rich foods, avoiding skipping meals, increasing water intake, work on meal planning and preparation, work on tracking and journaling calories using tracking application, reading food labels , emotional eating strategies and  understanding the difference between hunger signals and cravings, avoiding temptations and identifying enticing environmental cues, and planning for success.  Additional resources provided today: NA  Recommended Physical Activity Goals  [name] has been advised to work up to 150 minutes of moderate intensity aerobic activity a week and strengthening exercises 2-3 times per week for cardiovascular health, weight loss maintenance and preservation of muscle mass.   He has agreed to Think about ways to increase physical activity  Pharmacotherapy changes for the treatment of obesity: none4/16 463- f  ASSOCIATED CONDITIONS ADDRESSED TODAY  Sleep-disordered breathing Assessment & Plan: Never called to reschedule with [facility] for sleep study Appears to be high risk for OSA and c/o daytime somnelence This is likely impeding his weight loss  Ref to Dr Tresa Endo for polysomnogram evaluation  Orders: -     Ambulatory referral to Cardiology  Vitamin D deficiency Assessment & Plan: Last vitamin D Lab Results  Component Value Date   VD25OH 32.4 05/07/2022   Doing well on RX vitamin D 50,000 IU weekly without adverse side effect Energy level unchanged  Recheck level today  Orders: -     Vitamin D (Ergocalciferol); Take 1 capsule (50,000 Units total) by mouth every 7 (seven) days.  Dispense: 5 capsule; Refill: 0 -     VITAMIN D 25 Hydroxy (Vit-D Deficiency, Fractures)  Pre-diabetes Assessment & Plan: Lab Results  Component Value Date   HGBA1C 5.9 (H) 05/07/2022   HOMA-IR score 9.8 c/w significant insulin resistance based on 11/2 labs. Has not been consistent  in taking metformin 500 mg daily but also denies adverse side effects Has struggled to see weight loss over the past 5 mos  Plan: work on adherence to prescribed meal plan Encouraged compliance with metformin - can move to evening Update labs today.  Orders: -     metFORMIN HCl; Take 1 tablet (500 mg total) by mouth daily with  breakfast.  Dispense: 30 tablet; Refill: 0 -     Comprehensive metabolic panel -     Vitamin B12 -     Hemoglobin A1c  Morbid obesity Assessment & Plan: 2 lb of weight loss in the past 5 mos with fair compliance on prescribed meal plan High levels of fatigue have limited his exercise and he has lacked much motivation to make changes Wife has recently been working on weight loss, so this has been helpful  Consider use of a GLP-1 receptor agonist (cost barrier) Consider increasing metformin based on today's labs Follow up results of sleep study Gym or outdoor walking 15 min 3+ days/ wk  Consider bariatric surgery given high BMI   BMI 70 and over, adult      He was informed of the importance of frequent follow up visits to maximize his success with intensive lifestyle modifications for his multiple health conditions.   ATTESTASTION STATEMENTS:  Reviewed by clinician on day of visit: allergies, medications, problem list, medical history, surgical history, family history, social history, and previous encounter notes pertinent to obesity diagnosis.   I have personally spent 30 minutes total time today in preparation, patient care, nutritional counseling and documentation for this visit, including the following: review of clinical lab tests; review of medical tests/procedures/services.      Glennis Brink, DO DABFM, DABOM [facility] [address] [place], [place] [address] [phone]

## 2022-10-20 NOTE — Assessment & Plan Note (Signed)
Never called to reschedule with [facility] for sleep study Appears to be high risk for OSA and c/o daytime somnelence This is likely impeding his weight loss  Ref to Dr Tresa Endo for polysomnogram evaluation

## 2022-10-20 NOTE — Assessment & Plan Note (Signed)
Lab Results  Component Value Date   HGBA1C 5.9 (H) 05/07/2022   HOMA-IR score 9.8 c/w significant insulin resistance based on 11/2 labs. Has not been consistent in taking metformin 500 mg daily but also denies adverse side effects Has struggled to see weight loss over the past 5 mos  Plan: work on adherence to prescribed meal plan Encouraged compliance with metformin - can move to evening Update labs today.

## 2022-10-20 NOTE — Assessment & Plan Note (Signed)
Last vitamin D Lab Results  Component Value Date   VD25OH 32.4 05/07/2022   Doing well on RX vitamin D 50,000 IU weekly without adverse side effect Energy level unchanged  Recheck level today

## 2022-10-21 LAB — COMPREHENSIVE METABOLIC PANEL
ALT: 26 IU/L (ref 0–44)
AST: 21 IU/L (ref 0–40)
Albumin/Globulin Ratio: 1.9 (ref 1.2–2.2)
Albumin: 4.1 g/dL (ref 4.1–5.1)
Alkaline Phosphatase: 107 IU/L (ref 44–121)
BUN/Creatinine Ratio: 16 (ref 9–20)
BUN: 14 mg/dL (ref 6–24)
Bilirubin Total: 0.7 mg/dL (ref 0.0–1.2)
CO2: 21 mmol/L (ref 20–29)
Calcium: 9.4 mg/dL (ref 8.7–10.2)
Chloride: 100 mmol/L (ref 96–106)
Creatinine, Ser: 0.87 mg/dL (ref 0.76–1.27)
Globulin, Total: 2.2 g/dL (ref 1.5–4.5)
Glucose: 101 mg/dL — ABNORMAL HIGH (ref 70–99)
Potassium: 4.5 mmol/L (ref 3.5–5.2)
Sodium: 140 mmol/L (ref 134–144)
Total Protein: 6.3 g/dL (ref 6.0–8.5)
eGFR: 106 mL/min/{1.73_m2} (ref >59)

## 2022-10-21 LAB — VITAMIN B12: Vitamin B-12: 591 pg/mL (ref 232–1245)

## 2022-10-21 LAB — HEMOGLOBIN A1C
Est. average glucose Bld gHb Est-mCnc: 126 mg/dL
Hgb A1c MFr Bld: 6 % — ABNORMAL HIGH (ref 4.8–5.6)

## 2022-10-21 LAB — VITAMIN D 25 HYDROXY (VIT D DEFICIENCY, FRACTURES): Vit D, 25-Hydroxy: 26.4 ng/mL — ABNORMAL LOW (ref 30.0–100.0)

## 2022-11-17 ENCOUNTER — Encounter (INDEPENDENT_AMBULATORY_CARE_PROVIDER_SITE_OTHER): Payer: Self-pay | Admitting: Family Medicine

## 2022-11-17 ENCOUNTER — Ambulatory Visit (INDEPENDENT_AMBULATORY_CARE_PROVIDER_SITE_OTHER): Payer: 59 | Admitting: Family Medicine

## 2022-11-17 VITALS — BP 126/73 | HR 78 | Temp 97.7°F | Ht 66.0 in | Wt >= 6400 oz

## 2022-11-17 DIAGNOSIS — E559 Vitamin D deficiency, unspecified: Secondary | ICD-10-CM

## 2022-11-17 DIAGNOSIS — R7989 Other specified abnormal findings of blood chemistry: Secondary | ICD-10-CM

## 2022-11-17 DIAGNOSIS — R7303 Prediabetes: Secondary | ICD-10-CM

## 2022-11-17 DIAGNOSIS — G4730 Sleep apnea, unspecified: Secondary | ICD-10-CM

## 2022-11-17 DIAGNOSIS — Z6841 Body Mass Index (BMI) 40.0 and over, adult: Secondary | ICD-10-CM

## 2022-11-17 MED ORDER — METFORMIN HCL 500 MG PO TABS: 500.0000 mg | ORAL_TABLET | Freq: Two times a day (BID) | ORAL | 0 refills | Status: DC

## 2022-11-17 MED ORDER — VITAMIN D (ERGOCALCIFEROL) 1.25 MG (50000 UNIT) PO CAPS: 50000.0000 [IU] | ORAL_CAPSULE | ORAL | 0 refills | Status: DC

## 2022-11-17 NOTE — Assessment & Plan Note (Signed)
Jacob Hunt has failed to see weight loss in the past 6 mos of medically supervised weight management and his BMI remains over 70. He is high risk for serious medical problems related to his weight. His wife is here to support him today. He has declined weight loss surgery for now.  He would be a good candidate for use of a GLP-1 receptor agonist but he is not diabetic and his insurance does not cover them for obesity.  He should qualify with his CAD history and I've asked him to connect with the Pharm D at [facility] cardiology following his upcoming visit with Dr Tresa Endo  He is encouraged to complete his sleep study and keep track of his total daily calories

## 2022-11-17 NOTE — Progress Notes (Signed)
Office: [phone]  /  Fax: [phone]  WEIGHT SUMMARY AND BIOMETRICS  Starting Date: 05/17/22  Starting Weight: 465 lb   No data recorded  Vitals Temp: 97.7 F (36.5 C) BP: 126/73 Pulse Rate: 78 SpO2: 96 %   Body Composition  Body Fat %: 51.1 % Fat Mass (lbs): 288.8 lbs Muscle Mass (lbs): 174.8 lbs Visceral Fat Rating : 59     HPI  Chief Complaint: OBESITY  Jacob Hunt is here to discuss his progress with his obesity treatment plan. He is on the the Category 4 Plan and states he is following his eating plan approximately 60 % of the time. He states he is not exercising.   Interval History:  Since last office visit he is up 9 lb He has a net weight gain of 7 lb in the past 6 mos He admits to being off track with frequent meals out and skipping meals He has felt more tired He is scheduling a sleep study with Dr Tresa Endo His job has been mentally draining He has some chronic knee and back pain which hurts when walking more  Eating out guide given   Pharmacotherapy: metformin 500 mg daily for prediabetes  PHYSICAL EXAM:  Blood pressure 126/73, pulse 78, temperature 97.7 F (36.5 C), height 5\' 6"  (1.676 m), weight (!) 472 lb (214.1 kg), SpO2 96 %. Body mass index is 76.18 kg/m.  General: He is overweight, cooperative, alert, well developed, and in no acute distress. PSYCH: Has normal mood, affect and thought process.   Lungs: Normal breathing effort, no conversational dyspnea.   ASSESSMENT AND PLAN  TREATMENT PLAN FOR OBESITY:  Recommended Dietary Goals  Jacob Hunt is currently in the action stage of change. As such, his goal is to continue weight management plan. He has agreed to the Category 4 Plan.  Behavioral Intervention  We discussed the following Behavioral Modification Strategies today: increasing lean protein intake, decreasing simple carbohydrates , increasing vegetables, increasing lower glycemic fruits, increasing fiber rich foods, avoiding  skipping meals, increasing water intake, work on meal planning and preparation, keeping healthy foods at home, continue to practice mindfulness when eating, and planning for success.  Additional resources provided today: NA  Recommended Physical Activity Goals  [name] has been advised to work up to 150 minutes of moderate intensity aerobic activity a week and strengthening exercises 2-3 times per week for cardiovascular health, weight loss maintenance and preservation of muscle mass.   He has agreed to Think about ways to increase daily physical activity and overcoming barriers to exercise  Pharmacotherapy changes for the treatment of obesity: increase metformin to 500 mg bid  ASSOCIATED CONDITIONS ADDRESSED TODAY  Vitamin D deficiency Assessment & Plan: Last vitamin D Lab Results  Component Value Date   VD25OH 26.4 (L) 10/20/2022    Reviewed lab from last visit He is back on RX vitamin D 50,000 IU weekly.  He had run out of his RX prior to his lab draw Energy level is low  Continue/ Rfd RX vitamin D 50,000 IU weekly Recheck level in 3-4 mos  Orders: -     Vitamin D (Ergocalciferol); Take 1 capsule (50,000 Units total) by mouth every 7 (seven) days.  Dispense: 5 capsule; Refill: 0  Pre-diabetes Assessment & Plan: Lab Results  Component Value Date   HGBA1C 6.0 (H) 10/20/2022   Reviewed lab from last visit A1c worsening and he has failed to see weight loss over the past 6 mos He is having trouble complying with recommended  lifestyle changes He is tolerating metformin 500 mg once daily with food Exercise is limited due to back and knee arthritis pain  Continue to work on prescribed meal plan, reducing SSB intake to ZERO Plan to add in short walks as tolerated Increase metformin to 500 mg bid   Orders: -     metFORMIN HCl; Take 1 tablet (500 mg total) by mouth 2 (two) times daily with a meal.  Dispense: 60 tablet; Refill: 0  Morbid obesity (HCC) with starting BMI  75 Assessment & Plan: Jacob Hunt has failed to see weight loss in the past 6 mos of medically supervised weight management and his BMI remains over 70. He is high risk for serious medical problems related to his weight. His wife is here to support him today. He has declined weight loss surgery for now.  He would be a good candidate for use of a GLP-1 receptor agonist but he is not diabetic and his insurance does not cover them for obesity.  He should qualify with his CAD history and I've asked him to connect with the Pharm D at [facility] cardiology following his upcoming visit with Dr Tresa Endo  He is encouraged to complete his sleep study and keep track of his total daily calories    BMI 70 and over, adult [facility])  Sleep-disordered breathing Assessment & Plan: He needs to set up his visit with Dr Tresa Endo and complete a sleep study He has yet to call and schedule He c/o high levels of fatigue  We discussed how untreated sleep apnea is a barrier to his progress with fatigue, poor sleep, etc   Low vitamin B12 level Assessment & Plan: Reviewed lab from last visit B12 level has improved on OTC vitamin B12 500 mg daily Energy level still low (likely untreated OSA)  Denies paresthesias  Continue OTC Vitamin B12 500 mcg daily       He was informed of the importance of frequent follow up visits to maximize his success with intensive lifestyle modifications for his multiple health conditions.   ATTESTASTION STATEMENTS:  Reviewed by clinician on day of visit: allergies, medications, problem list, medical history, surgical history, family history, social history, and previous encounter notes pertinent to obesity diagnosis.   I have personally spent 30 minutes total time today in preparation, patient care, nutritional counseling and documentation for this visit, including the following: review of clinical lab tests; review of medical tests/procedures/services.      Glennis Brink, DO DABFM,  DABOM [facility] [address] [place], [place] [address] [phone]

## 2022-11-17 NOTE — Assessment & Plan Note (Signed)
Reviewed lab from last visit B12 level has improved on OTC vitamin B12 500 mg daily Energy level still low (likely untreated OSA)  Denies paresthesias  Continue OTC Vitamin B12 500 mcg daily

## 2022-11-17 NOTE — Assessment & Plan Note (Signed)
Last vitamin D Lab Results  Component Value Date   VD25OH 26.4 (L) 10/20/2022    Reviewed lab from last visit He is back on RX vitamin D 50,000 IU weekly.  He had run out of his RX prior to his lab draw Energy level is low  Continue/ Rfd RX vitamin D 50,000 IU weekly Recheck level in 3-4 mos

## 2022-11-17 NOTE — Assessment & Plan Note (Signed)
Lab Results  Component Value Date   HGBA1C 6.0 (H) 10/20/2022   Reviewed lab from last visit A1c worsening and he has failed to see weight loss over the past 6 mos He is having trouble complying with recommended lifestyle changes He is tolerating metformin 500 mg once daily with food Exercise is limited due to back and knee arthritis pain  Continue to work on prescribed meal plan, reducing SSB intake to ZERO Plan to add in short walks as tolerated Increase metformin to 500 mg bid

## 2022-11-17 NOTE — Assessment & Plan Note (Signed)
He needs to set up his visit with Dr Tresa Endo and complete a sleep study He has yet to call and schedule He c/o high levels of fatigue  We discussed how untreated sleep apnea is a barrier to his progress with fatigue, poor sleep, etc

## 2022-12-22 ENCOUNTER — Telehealth (INDEPENDENT_AMBULATORY_CARE_PROVIDER_SITE_OTHER): Payer: Self-pay | Admitting: Family Medicine

## 2022-12-22 ENCOUNTER — Encounter (INDEPENDENT_AMBULATORY_CARE_PROVIDER_SITE_OTHER): Payer: Self-pay | Admitting: Family Medicine

## 2022-12-22 ENCOUNTER — Ambulatory Visit (INDEPENDENT_AMBULATORY_CARE_PROVIDER_SITE_OTHER): Payer: 59 | Admitting: Family Medicine

## 2022-12-22 VITALS — BP 128/85 | HR 70 | Temp 97.8°F | Ht 66.0 in | Wt >= 6400 oz

## 2022-12-22 DIAGNOSIS — Z6841 Body Mass Index (BMI) 40.0 and over, adult: Secondary | ICD-10-CM

## 2022-12-22 DIAGNOSIS — K219 Gastro-esophageal reflux disease without esophagitis: Secondary | ICD-10-CM

## 2022-12-22 DIAGNOSIS — I2510 Atherosclerotic heart disease of native coronary artery without angina pectoris: Secondary | ICD-10-CM | POA: Diagnosis not present

## 2022-12-22 DIAGNOSIS — E559 Vitamin D deficiency, unspecified: Secondary | ICD-10-CM | POA: Diagnosis not present

## 2022-12-22 DIAGNOSIS — R7303 Prediabetes: Secondary | ICD-10-CM | POA: Diagnosis not present

## 2022-12-22 DIAGNOSIS — G4730 Sleep apnea, unspecified: Secondary | ICD-10-CM

## 2022-12-22 MED ORDER — WEGOVY 0.25 MG/0.5ML ~~LOC~~ SOAJ: 0.2500 mg | SUBCUTANEOUS | 0 refills | Status: DC

## 2022-12-22 MED ORDER — METFORMIN HCL 500 MG PO TABS: 500.0000 mg | ORAL_TABLET | Freq: Two times a day (BID) | ORAL | 0 refills | Status: DC

## 2022-12-22 MED ORDER — PANTOPRAZOLE SODIUM 40 MG PO TBEC: 40.0000 mg | DELAYED_RELEASE_TABLET | Freq: Every day | ORAL | 0 refills | Status: DC

## 2022-12-22 MED ORDER — VITAMIN D (ERGOCALCIFEROL) 1.25 MG (50000 UNIT) PO CAPS: 50000.0000 [IU] | ORAL_CAPSULE | ORAL | 0 refills | Status: DC

## 2022-12-22 NOTE — Assessment & Plan Note (Signed)
He is awaiting a sleep study with Dr. Tresa Endo, scheduled for an office visit 04/06/2023. He is struggling to get adequate sleep at night He appears to be high risk for obstructive sleep apnea Untreated OSA may be contributing more to obesity and difficulty with weight reduction  Keep upcoming visit with Dr. Tresa Endo, use pillows to elevate head at night

## 2022-12-22 NOTE — Telephone Encounter (Signed)
Prior authorization approved for patients Wegovy. Patient notified.  

## 2022-12-22 NOTE — Telephone Encounter (Signed)
Prior authorization done via cover my meds for patients. Wegovy. Waiting on determination.  

## 2022-12-22 NOTE — Assessment & Plan Note (Signed)
Patient qualifies for use of Wegovy for history of CAD.  He denies a personal or family history for pancreatitis, measure thyroid carcinoma or multiple endocrine neoplasia type II.  He understands potential adverse side effects of Wegovy.  Check out [email or web address] for information on pen injection technique.  Begin Wegovy at 0.25 mg once weekly injection. Continue current medications per cardiology

## 2022-12-22 NOTE — Assessment & Plan Note (Signed)
Last vitamin D Lab Results  Component Value Date   VD25OH 26.4 (L) 10/20/2022   He is doing well on prescription ergocalciferol once weekly.  Energy level is stable.  He denies adverse side effect.  Recheck level in 2 months

## 2022-12-22 NOTE — Assessment & Plan Note (Signed)
Worsening.  He has not been on any acid reflux medication only using Tums as needed.  He has had worsening acid reflux at night with sour brash taste.  His obesity is likely an underlying contributing factor.  Avoid late night eating, high acid foods and continue working on weight reduction. Begin pantoprazole 40 mg once daily.

## 2022-12-22 NOTE — Assessment & Plan Note (Signed)
Lab Results  Component Value Date   HGBA1C 6.0 (H) 10/20/2022   Last A1c 6.0, up from 5.9.  He is doing well on metformin 500 mg which was increased to twice daily last visit.  He is sometimes forgetting to take his second dose.  He denies adverse side effects.  He is working on reducing his intake of added sugar and refined carbohydrates but has struggled to see weight reduction.  We discussed adding in small walks throughout the day.  Read labels on food and drink for sugar, avoiding products with over 8 g of sugar per serving.  Strongly encouraged complete cessation of sugar sweetened beverages.

## 2022-12-22 NOTE — Progress Notes (Signed)
Office: [phone]  /  Fax: [phone]  WEIGHT SUMMARY AND BIOMETRICS  Starting Date: 05/17/22  Starting Weight: 465lb   Weight Lost Since Last Visit: 5lb   Vitals Temp: 97.8 F (36.6 C) BP: 128/85 Pulse Rate: 70 SpO2: 98 %   Body Composition  Body Fat %: 59.5 % Fat Mass (lbs): 278.2 lbs Muscle Mass (lbs): 180.2 lbs Visceral Fat Rating : 59     HPI  Chief Complaint: OBESITY  Jacob Hunt is here to discuss his progress with his obesity treatment plan. He is on the the Category 4 Plan and states he is following his eating plan approximately 40 % of the time. He states he is exercising 0 minutes 0 times per week.   Interval History:  Since last office visit he is down 5 lb He is tolerating Metformin 500 mg bid  He skipping breakfast some days -- can drink a protein shake in the morning He is down to 1 soda and has cut back in sugar in tea He is awaiting a sleep study He has a net weight gain of 2 pounds in the past 7 months of medically supervised weight management He is craving sweets  He is doing no regular exercise  Pharmacotherapy: Metformin 500 mg twice daily  PHYSICAL EXAM:  Blood pressure 128/85, pulse 70, temperature 97.8 F (36.6 C), height 5\' 6"  (1.676 m), weight (!) 467 lb (211.8 kg), SpO2 98 %. Body mass index is 75.38 kg/m.  General: He is overweight, cooperative, alert, well developed, and in no acute distress. PSYCH: Has normal mood, affect and thought process.   Lungs: Normal breathing effort, no conversational dyspnea.   ASSESSMENT AND PLAN  TREATMENT PLAN FOR OBESITY:  Recommended Dietary Goals  Jacob Hunt is currently in the action stage of change. As such, his goal is to continue weight management plan. He has agreed to the Category 4 Plan.  Behavioral Intervention  We discussed the following Behavioral Modification Strategies today: increasing lean protein intake, decreasing simple carbohydrates , increasing vegetables, increasing  lower glycemic fruits, avoiding skipping meals, increasing water intake, identifying sources and decreasing liquid calories, continue to practice mindfulness when eating, and planning for success.  Additional resources provided today: NA  Recommended Physical Activity Goals  Jacob Hunt has been advised to work up to 150 minutes of moderate intensity aerobic activity a week and strengthening exercises 2-3 times per week for cardiovascular health, weight loss maintenance and preservation of muscle mass.   He has agreed to Think about ways to increase daily physical activity and overcoming barriers to exercise  Pharmacotherapy changes for the treatment of obesity:   ASSOCIATED CONDITIONS ADDRESSED TODAY  Coronary artery disease involving native coronary artery of native heart without angina pectoris Assessment & Plan: Patient qualifies for use of Wegovy for history of CAD.  He denies a personal or family history for pancreatitis, measure thyroid carcinoma or multiple endocrine neoplasia type II.  He understands potential adverse side effects of Wegovy.  Check out [email or web address] for information on pen injection technique.  Begin Wegovy at 0.25 mg once weekly injection. Continue current medications per cardiology  Orders: -     YQMVHQ; Inject 0.25 mg into the skin once a week.  Dispense: 2 mL; Refill: 0  Pre-diabetes Assessment & Plan: Lab Results  Component Value Date   HGBA1C 6.0 (H) 10/20/2022   Last A1c 6.0, up from 5.9.  He is doing well on metformin 500 mg which was increased to twice daily last visit.  He  is sometimes forgetting to take his second dose.  He denies adverse side effects.  He is working on reducing his intake of added sugar and refined carbohydrates but has struggled to see weight reduction.  We discussed adding in small walks throughout the day.  Read labels on food and drink for sugar, avoiding products with over 8 g of sugar per serving.  Strongly encouraged complete  cessation of sugar sweetened beverages.  Orders: -     metFORMIN HCl; Take 1 tablet (500 mg total) by mouth 2 (two) times daily with a meal.  Dispense: 60 tablet; Refill: 0  Vitamin D deficiency Assessment & Plan: Last vitamin D Lab Results  Component Value Date   VD25OH 26.4 (L) 10/20/2022   He is doing well on prescription ergocalciferol once weekly.  Energy level is stable.  He denies adverse side effect.  Recheck level in 2 months  Orders: -     Vitamin D (Ergocalciferol); Take 1 capsule (50,000 Units total) by mouth every 7 (seven) days.  Dispense: 5 capsule; Refill: 0  Gastroesophageal reflux disease without esophagitis Assessment & Plan: Worsening.  He has not been on any acid reflux medication only using Tums as needed.  He has had worsening acid reflux at night with sour brash taste.  His obesity is likely an underlying contributing factor.  Avoid late night eating, high acid foods and continue working on weight reduction. Begin pantoprazole 40 mg once daily.  Orders: -     Pantoprazole Sodium; Take 1 tablet (40 mg total) by mouth daily.  Dispense: 90 tablet; Refill: 0  Morbid obesity (HCC) with starting BMI 75  BMI 70 and over, adult [facility])  Sleep-disordered breathing Assessment & Plan: He is awaiting a sleep study with Dr. Tresa Endo, scheduled for an office visit 04/06/2023. He is struggling to get adequate sleep at night He appears to be high risk for obstructive sleep apnea Untreated OSA may be contributing more to obesity and difficulty with weight reduction  Keep upcoming visit with Dr. Tresa Endo, use pillows to elevate head at night       He was informed of the importance of frequent follow up visits to maximize his success with intensive lifestyle modifications for his multiple health conditions.   ATTESTASTION STATEMENTS:  Reviewed by clinician on day of visit: allergies, medications, problem list, medical history, surgical history, family history, social  history, and previous encounter notes pertinent to obesity diagnosis.   I have personally spent 30 minutes total time today in preparation, patient care, nutritional counseling and documentation for this visit, including the following: review of clinical lab tests; review of medical tests/procedures/services.      Glennis Brink, DO DABFM, DABOM [facility] [address] [place], [place] [address] [phone]

## 2022-12-30 NOTE — Progress Notes (Signed)
Cardiology Clinic Note   Patient Name: Jacob Hunt Date of Encounter: 01/01/2023  Primary Care Provider:  Irven Coe, MD Primary Cardiologist:  Nanetta Batty, MD  Patient Profile    52 year old male with history of hypertension, normal cardiac catheterization most recent 08/02/2015, morbid obesity, hyperlipidemia, chronic diastolic heart failure, and family history of premature coronary artery disease.  On last office visit with Dr. Allyson Sabal on 12/09/2021 he was referred to [facility] health healthy weight and wellness for management of obesity, most recent weight 467 pounds.  Past Medical History    Past Medical History:  Diagnosis Date   Back pain    CAD (coronary artery disease)    non-obstructive CAD on LHC in 03/2017   Chronic diastolic CHF (congestive heart failure) (HCC)    Chronic lower extremity edema    Essential hypertension    GERD (gastroesophageal reflux disease)    Heartburn    Hyperlipidemia    Joint pain    Lumbar herniated disc    Morbid obesity (HCC)    Swelling    Past Surgical History:  Procedure Laterality Date   CARDIAC CATHETERIZATION N/A 08/02/2015   Procedure: Left Heart Cath and Coronary Angiography;  Surgeon: Peter M Swaziland, MD;  Location: [facility] INVASIVE CV LAB;  Service: Cardiovascular;  Laterality: N/A;   CARDIAC CATHETERIZATION  03/15/2017   LEFT HEART CATH AND CORONARY ANGIOGRAPHY N/A 03/15/2017   Procedure: LEFT HEART CATH AND CORONARY ANGIOGRAPHY;  Surgeon: Tonny Bollman, MD;  Location: [facility] INVASIVE CV LAB;  Service: Cardiovascular;  Laterality: N/A;    Allergies  No Known Allergies  History of Present Illness    52 year old male we are following for ongoing assessment management of hypertension, hyperlipidemia and chronic diastolic heart failure, with morbid obesity on Wegovy followed by  healthy weight and wellness.  He is also here to speak with.  Healthy weight and wellness is ordered this the need to consultation with pharmacist  prior to beginning treatment.  He comes today without any complaints of chest pain, he has chronic shortness of breath from his obesity with ambulation, he does have some chronic lower extremity edema from his obesity.  He denies muscle cramps or pain.  He is medically compliant.  In need of refills on all of his medications.  Labs are recently been drawn by weight loss service.  Home Medications    Current Outpatient Medications  Medication Sig Dispense Refill   aspirin EC 81 MG tablet Take 1 tablet (81 mg total) by mouth daily. 30 tablet 0   metFORMIN (GLUCOPHAGE) 500 MG tablet Take 1 tablet (500 mg total) by mouth 2 (two) times daily with a meal. 60 tablet 0   omega-3 acid ethyl esters (LOVAZA) 1 g capsule Take 2 capsules by mouth once daily 180 capsule 3   pantoprazole (PROTONIX) 40 MG tablet Take 1 tablet (40 mg total) by mouth daily. 90 tablet 0   Semaglutide-Weight Management (WEGOVY) 0.25 MG/0.5ML SOAJ Inject 0.25 mg into the skin once a week. 2 mL 0   Vitamin D, Ergocalciferol, (DRISDOL) 1.25 MG (50000 UNIT) CAPS capsule Take 1 capsule (50,000 Units total) by mouth every 7 (seven) days. 5 capsule 0   atorvastatin (LIPITOR) 80 MG tablet Take 1 tablet (80 mg total) by mouth daily at 6 PM. 90 tablet 3   furosemide (LASIX) 40 MG tablet Take 1 tablet (40 mg total) by mouth daily. 90 tablet 3   losartan (COZAAR) 50 MG tablet Take 1 tablet (50 mg total)  by mouth daily. 90 tablet 3   nitroGLYCERIN (NITROSTAT) 0.4 MG SL tablet Place 1 tablet (0.4 mg total) under the tongue every 5 (five) minutes x 3 doses as needed for chest pain. 25 tablet 5   No current facility-administered medications for this visit.     Family History    Family History  Problem Relation Age of Onset   Hyperlipidemia Mother    Diabetes Mother        AGE 11   Hypertension Mother    CAD Mother    Cancer Mother        BREAST   Obesity Mother    Cancer Father    Hyperlipidemia Father    Diabetes Father        AGE  9   Hypertension Father    CAD Father    Diabetes Brother        AGE 47   Hypertension Brother    CAD Brother    Coronary artery disease Maternal Grandfather    Cancer Maternal Grandfather        BREAST   Cancer Maternal Aunt        BREAST   Other Other        FX HX OF AODM   He indicated that his mother is alive. He indicated that his father is alive. He indicated that his brother is alive. He indicated that his maternal grandmother is deceased. He indicated that his maternal grandfather is deceased. He indicated that his paternal grandmother is deceased. He indicated that his paternal grandfather is deceased. He indicated that his maternal aunt is alive. He indicated that only one of his two others is alive.  Social History    Social History   Socioeconomic History   Marital status: Married    Spouse name: beverly   Number of children: Not on file   Years of education: Not on file   Highest education level: Not on file  Occupational History   Not on file  Tobacco Use   Smoking status: Never   Smokeless tobacco: Never  Vaping Use   Vaping Use: Never used  Substance and Sexual Activity   Alcohol use: No    Alcohol/week: 0.0 standard drinks of alcohol   Drug use: No   Sexual activity: Yes  Other Topics Concern   Not on file  Social History Narrative   Not on file   Social Determinants of Health   Financial Resource Strain: Not on file  Food Insecurity: Not on file  Transportation Needs: Not on file  Physical Activity: Not on file  Stress: Not on file  Social Connections: Not on file  Intimate Partner Violence: Not on file     Review of Systems    General:  No chills, fever, night sweats or weight changes.  Cardiovascular:  No chest pain, dyspnea on exertion, edema, orthopnea, palpitations, paroxysmal nocturnal dyspnea. Dermatological: No rash, lesions/masses Respiratory: No cough, dyspnea Urologic: No hematuria, dysuria Abdominal:   No nausea, vomiting,  diarrhea, bright red blood per rectum, melena, or hematemesis Neurologic:  No visual changes, wkns, changes in mental status. All other systems reviewed and are otherwise negative except as noted above.     Physical Exam    VS:  BP 128/82   Pulse 75   Ht 5\' 6"  (1.676 m)   Wt (!) 480 lb 3.2 oz (217.8 kg)   SpO2 95%   BMI 77.51 kg/m  , BMI Body mass index is 77.51 kg/m.  GEN: Well nourished, well developed, in no acute distress. HEENT: normal. Neck: Supple, no JVD, carotid bruits, or masses. Cardiac: RRR, no murmurs, rubs, or gallops. No clubbing, cyanosis, edema.  Radials/DP/PT 2+ and equal bilaterally.  Respiratory:  Respirations regular and unlabored, clear to auscultation bilaterally. GI: Soft, nontender, nondistended, BS + x 4. MS: no deformity or atrophy. Skin: warm and dry, no rash. Neuro:  Strength and sensation are intact. Psych: Normal affect.  Accessory Clinical Findings      Lab Results  Component Value Date   WBC 7.4 05/07/2022   HGB 14.2 05/07/2022   HCT 42.5 05/07/2022   MCV 89 05/07/2022   PLT 252 05/07/2022   Lab Results  Component Value Date   CREATININE 0.87 10/20/2022   BUN 14 10/20/2022   NA 140 10/20/2022   K 4.5 10/20/2022   CL 100 10/20/2022   CO2 21 10/20/2022   Lab Results  Component Value Date   ALT 26 10/20/2022   AST 21 10/20/2022   ALKPHOS 107 10/20/2022   BILITOT 0.7 10/20/2022   Lab Results  Component Value Date   CHOL 135 05/07/2022   HDL 44 05/07/2022   LDLCALC 71 05/07/2022   TRIG 108 05/07/2022   CHOLHDL 3.1 05/07/2022    Lab Results  Component Value Date   HGBA1C 6.0 (H) 10/20/2022    Review of Prior Studies     Echocardiogram 03/16/2017 Left ventricle: The cavity size was normal. There was mild focal    basal hypertrophy of the septum. Systolic function was normal.    The estimated ejection fraction was in the range of 55% to 60%.    Wall motion was normal; there were no regional wall motion     abnormalities. Doppler parameters are consistent with abnormal    left ventricular relaxation (grade 1 diastolic dysfunction).   LHC 03/15/2017 1. Widely patent coronary arteries with mild ectasia of the proximal LAD and minimal nonobstructive disease involving the proximal RCA, unchanged from 2017 cath 2. Normal LV systolic function  Assessment & Plan   1.  Morbid obesity: Now (see their note for more details (.  He will follow-up with them for ongoing management and instruction.  2.  Hypertension: Blood pressures currently very well-controlled despite his obesity on losartan 50 mg daily.  Hopefully with weight loss we can titrate this down.  Labs are followed by weight service.  3.  Hypercholesterolemia: He is currently on high-dose atorvastatin 80 mg daily.  Fasting lipids and LFTs should be completed by either primary care or in our office on follow-up if not completed.         Signed, Bettey Mare. Liborio Nixon, ANP, AACC   01/01/2023 5:02 PM      Office [phone] Fax [phone]  Notice: This dictation was prepared with Dragon dictation along with smaller phrase technology. Any transcriptional errors that result from this process are unintentional and may not be corrected upon review.

## 2023-01-01 ENCOUNTER — Encounter: Payer: Self-pay | Admitting: Adult Health

## 2023-01-01 ENCOUNTER — Telehealth: Payer: Self-pay | Admitting: Pharmacist

## 2023-01-01 ENCOUNTER — Ambulatory Visit: Payer: 59 | Attending: Adult Health | Admitting: Adult Health

## 2023-01-01 VITALS — BP 128/82 | HR 75 | Ht 66.0 in | Wt >= 6400 oz

## 2023-01-01 DIAGNOSIS — I214 Non-ST elevation (NSTEMI) myocardial infarction: Secondary | ICD-10-CM

## 2023-01-01 DIAGNOSIS — Z8249 Family history of ischemic heart disease and other diseases of the circulatory system: Secondary | ICD-10-CM | POA: Diagnosis not present

## 2023-01-01 DIAGNOSIS — I5033 Acute on chronic diastolic (congestive) heart failure: Secondary | ICD-10-CM

## 2023-01-01 DIAGNOSIS — I10 Essential (primary) hypertension: Secondary | ICD-10-CM

## 2023-01-01 DIAGNOSIS — E785 Hyperlipidemia, unspecified: Secondary | ICD-10-CM

## 2023-01-01 MED ORDER — FUROSEMIDE 40 MG PO TABS: 40.0000 mg | ORAL_TABLET | Freq: Every day | ORAL | 3 refills | Status: AC

## 2023-01-01 MED ORDER — ATORVASTATIN CALCIUM 80 MG PO TABS: 80.0000 mg | ORAL_TABLET | Freq: Every day | ORAL | 3 refills | Status: DC

## 2023-01-01 MED ORDER — NITROGLYCERIN 0.4 MG SL SUBL: 0.4000 mg | SUBLINGUAL_TABLET | SUBLINGUAL | 5 refills | Status: AC | PRN

## 2023-01-01 MED ORDER — LOSARTAN POTASSIUM 50 MG PO TABS: 50.0000 mg | ORAL_TABLET | Freq: Every day | ORAL | 3 refills | Status: DC

## 2023-01-01 NOTE — Telephone Encounter (Signed)
Patient was in to see Joni Reining, NP. Patient has been prescribed Wegovy by healthy weight management. Does not know anything about the medication.  Consultation provided on GLP1 - MOA, dose titration, potential s/e and how to manage them. Significance of following healthy diet to minimize side effects of GLP1 and importance of regular exercise (150 min per week)

## 2023-01-01 NOTE — Patient Instructions (Signed)
Medication Instructions:   No Changes *If you need a refill on your cardiac medications before your next appointment, please call your pharmacy*   Lab Work: No labs If you have labs (blood work) drawn today and your tests are completely normal, you will receive your results only by: MyChart Message (if you have MyChart) OR A paper copy in the mail If you have any lab test that is abnormal or we need to change your treatment, we will call you to review the results.   Testing/Procedures: No Testing   Follow-Up: At [facility], you and your health needs are our priority.  As part of our continuing mission to provide you with exceptional heart care, we have created designated Provider Care Teams.  These Care Teams include your primary Cardiologist (physician) and Advanced Practice Providers (APPs -  Physician Assistants and Nurse Practitioners) who all work together to provide you with the care you need, when you need it.  We recommend signing up for the patient portal called "MyChart".  Sign up information is provided on this After Visit Summary.  MyChart is used to connect with patients for Virtual Visits (Telemedicine).  Patients are able to view lab/test results, encounter notes, upcoming appointments, etc.  Non-urgent messages can be sent to your provider as well.   To learn more about what you can do with MyChart, go to [email or web address].    Your next appointment:   6 month(s)  Provider:   Jonathan Berry, MD    

## 2023-01-21 ENCOUNTER — Encounter (INDEPENDENT_AMBULATORY_CARE_PROVIDER_SITE_OTHER): Payer: Self-pay | Admitting: Family Medicine

## 2023-01-21 ENCOUNTER — Ambulatory Visit (INDEPENDENT_AMBULATORY_CARE_PROVIDER_SITE_OTHER): Payer: 59 | Admitting: Family Medicine

## 2023-01-21 ENCOUNTER — Other Ambulatory Visit: Payer: Self-pay

## 2023-01-21 VITALS — BP 133/80 | HR 70 | Temp 98.0°F | Ht 66.0 in | Wt >= 6400 oz

## 2023-01-21 DIAGNOSIS — I2510 Atherosclerotic heart disease of native coronary artery without angina pectoris: Secondary | ICD-10-CM | POA: Diagnosis not present

## 2023-01-21 DIAGNOSIS — K219 Gastro-esophageal reflux disease without esophagitis: Secondary | ICD-10-CM | POA: Diagnosis not present

## 2023-01-21 DIAGNOSIS — R7303 Prediabetes: Secondary | ICD-10-CM | POA: Diagnosis not present

## 2023-01-21 DIAGNOSIS — E559 Vitamin D deficiency, unspecified: Secondary | ICD-10-CM | POA: Diagnosis not present

## 2023-01-21 DIAGNOSIS — Z6841 Body Mass Index (BMI) 40.0 and over, adult: Secondary | ICD-10-CM

## 2023-01-21 MED ORDER — VITAMIN D (ERGOCALCIFEROL) 1.25 MG (50000 UNIT) PO CAPS: 50000.0000 [IU] | ORAL_CAPSULE | ORAL | 0 refills | Status: DC

## 2023-01-21 MED ORDER — WEGOVY 0.25 MG/0.5ML ~~LOC~~ SOAJ
0.2500 mg | SUBCUTANEOUS | 0 refills | Status: DC
  Filled 2023-01-21: qty 2, 28d supply, fill #0

## 2023-01-21 MED ORDER — PANTOPRAZOLE SODIUM 40 MG PO TBEC: 40.0000 mg | DELAYED_RELEASE_TABLET | Freq: Every day | ORAL | 0 refills | Status: DC

## 2023-01-21 NOTE — Assessment & Plan Note (Signed)
Prescribed Wegovy 0.25 mg weekly injection with CAD history. Denies CP or DOE Actively working on [employer] reduction with morbid obesity  Reviewed [employer] pen use/ website Re-sent RX for Wegovy 0.25 mg #2 ml, 0 RF

## 2023-01-21 NOTE — Progress Notes (Signed)
Office: [phone]  /  Fax: [phone]  WEIGHT SUMMARY AND BIOMETRICS  Starting Date: 05/17/22  Starting Weight: 465lb   Weight Lost Since Last Visit: 0lb   Vitals Temp: 98 F (36.7 C) BP: 133/80 Pulse Rate: 70 SpO2: 98 %   Body Composition  Body Fat %: 59.8 % Fat Mass (lbs): 281.2 lbs Muscle Mass (lbs): 180 lbs Visceral Fat Rating : 59     HPI  Chief Complaint: OBESITY  Rowland is here to discuss his progress with his obesity treatment plan. He is on the the Category 4 Plan and states he is following his eating plan approximately 70 % of the time. He states he is exercising 0 minutes 0 times per week.   Interval History:  Since last office visit he is up 3 lb He has not yet started [facility] with approval with CAD indication He is ready to work on lifestyle changes He is trying out more high protein snacks but does struggle with overeating and poor food choices at meals Wife (here today) is supportive Doing no exercise Added [employer] while working outside  Pharmacotherapy: metformin 500 mg bid for PDM  PHYSICAL EXAM:  Blood pressure 133/80, pulse 70, temperature 98 F (36.7 C), height 5\' 6"  (1.676 m), weight (!) 470 lb (213.2 kg), SpO2 98%. Body mass index is 75.86 kg/m.  General: He is overweight, cooperative, alert, well developed, and in no acute distress. PSYCH: Has normal mood, affect and thought process.   Lungs: Normal breathing effort, no conversational dyspnea.   ASSESSMENT AND PLAN  TREATMENT PLAN FOR OBESITY:  Recommended Dietary Goals  Tiras is currently in the action stage of change. As such, his goal is to continue weight management plan. He has agreed to the Category 4 Plan.  Behavioral Intervention  We discussed the following Behavioral Modification Strategies today: increasing lean protein intake, decreasing simple carbohydrates , increasing vegetables, increasing lower glycemic fruits, increasing water intake, work on meal  planning and preparation, keeping healthy foods at home, avoiding temptations and identifying enticing environmental cues, continue to work on implementation of reduced calorie nutritional plan, continue to practice mindfulness when eating, and planning for success.  Additional resources provided today: NA  Recommended Physical Activity Goals  Demontez has been advised to work up to 150 minutes of moderate intensity aerobic activity a week and strengthening exercises 2-3 times per week for cardiovascular health, weight loss maintenance and preservation of muscle mass.   He has agreed to Think about ways to increase daily physical activity and overcoming barriers to exercise  Pharmacotherapy changes for the treatment of obesity: start Wegovy 0.25 mg weekly  ASSOCIATED CONDITIONS ADDRESSED TODAY  Pre-diabetes Assessment & Plan: Lab Results  Component Value Date   HGBA1C 6.0 (H) 10/20/2022   Doing well on metformin 500 mg bid without adverse SE Has room for improvement with dietary compliance and exercise  Continue to work on prescribed dietary plan     Vitamin D deficiency -     Vitamin D (Ergocalciferol); Take 1 capsule (50,000 Units total) by mouth every 7 (seven) days.  Dispense: 5 capsule; Refill: 0  Gastroesophageal reflux disease without esophagitis -     Pantoprazole Sodium; Take 1 tablet (40 mg total) by mouth daily.  Dispense: 90 tablet; Refill: 0  Coronary artery disease involving native coronary artery of native heart without angina pectoris Assessment & Plan: Prescribed Wegovy 0.25 mg weekly injection with CAD history. Denies CP or DOE Actively working on weight reduction with morbid  obesity  Reviewed [employer] pen use/ website Re-sent RX for Wegovy 0.25 mg #2 ml, 0 RF  Orders: -     KGMWNU; Inject 0.25 mg into the skin once a week.  Dispense: 2 mL; Refill: 0  Morbid obesity (HCC) with starting BMI 75  BMI 70 and over, adult [facility])      He was informed of the  importance of frequent follow up visits to maximize his success with intensive lifestyle modifications for his multiple health conditions.   ATTESTASTION STATEMENTS:  Reviewed by clinician on day of visit: allergies, medications, problem list, medical history, surgical history, family history, social history, and previous encounter notes pertinent to obesity diagnosis.   I have personally spent 30 minutes total time today in preparation, patient care, nutritional counseling and documentation for this visit, including the following: review of clinical lab tests; review of medical tests/procedures/services.      Glennis Brink, DO DABFM, DABOM [facility] [address] [place], [place] [address] [phone]

## 2023-01-21 NOTE — Assessment & Plan Note (Signed)
Lab Results  Component Value Date   HGBA1C 6.0 (H) 10/20/2022   Doing well on metformin 500 mg bid without adverse SE Has room for improvement with dietary compliance and exercise  Continue to work on prescribed dietary plan

## 2023-01-22 ENCOUNTER — Other Ambulatory Visit: Payer: Self-pay

## 2023-02-03 ENCOUNTER — Other Ambulatory Visit: Payer: Self-pay

## 2023-02-03 DIAGNOSIS — Z79899 Other long term (current) drug therapy: Secondary | ICD-10-CM

## 2023-02-04 MED ORDER — OMEGA-3-ACID ETHYL ESTERS 1 G PO CAPS: 2.0000 | ORAL_CAPSULE | Freq: Every day | ORAL | 3 refills | Status: DC

## 2023-02-18 ENCOUNTER — Ambulatory Visit (INDEPENDENT_AMBULATORY_CARE_PROVIDER_SITE_OTHER): Payer: 59 | Admitting: Family Medicine

## 2023-02-18 ENCOUNTER — Other Ambulatory Visit: Payer: Self-pay

## 2023-02-18 ENCOUNTER — Encounter (INDEPENDENT_AMBULATORY_CARE_PROVIDER_SITE_OTHER): Payer: Self-pay | Admitting: Family Medicine

## 2023-02-18 VITALS — BP 124/82 | HR 70 | Temp 98.0°F | Ht 66.0 in | Wt >= 6400 oz

## 2023-02-18 DIAGNOSIS — G4730 Sleep apnea, unspecified: Secondary | ICD-10-CM

## 2023-02-18 DIAGNOSIS — E559 Vitamin D deficiency, unspecified: Secondary | ICD-10-CM

## 2023-02-18 DIAGNOSIS — I2510 Atherosclerotic heart disease of native coronary artery without angina pectoris: Secondary | ICD-10-CM | POA: Diagnosis not present

## 2023-02-18 DIAGNOSIS — Z6841 Body Mass Index (BMI) 40.0 and over, adult: Secondary | ICD-10-CM

## 2023-02-18 DIAGNOSIS — R7303 Prediabetes: Secondary | ICD-10-CM

## 2023-02-18 MED ORDER — METFORMIN HCL 500 MG PO TABS: 500.0000 mg | ORAL_TABLET | Freq: Two times a day (BID) | ORAL | 0 refills | Status: DC

## 2023-02-18 MED ORDER — WEGOVY 0.5 MG/0.5ML ~~LOC~~ SOAJ
0.5000 mg | SUBCUTANEOUS | 0 refills | Status: DC
  Filled 2023-02-18: qty 2, 28d supply, fill #0

## 2023-02-18 MED ORDER — VITAMIN D (ERGOCALCIFEROL) 1.25 MG (50000 UNIT) PO CAPS: 50000.0000 [IU] | ORAL_CAPSULE | ORAL | 0 refills | Status: DC

## 2023-02-18 NOTE — Progress Notes (Signed)
Office: [phone]  /  Fax: [phone]  WEIGHT SUMMARY AND BIOMETRICS  No data recorded  No data recorded   No data recorded   No data recorded  No data recorded    HPI  Chief Complaint: OBESITY  Nivan is here to discuss his progress with his obesity treatment plan. He is on the the Category 4 Plan and states he is following his eating plan approximately 60 % of the time. He states he is exercising 0 minutes 0 times per week.   Interval History:  Since last office visit he is down 15 lb in the past 4 weeks He is up 3.6 lb of muscle mass and lost 18.8 lb of body fat since last visit He has started Wegovy at 0.25 mg weekly injection, started by cardiology for CAD indication He has completed 4 injections He has had some diarrhea, no nausea, constipation or heartburn. He feels improved satiety with smaller portions sizes and is making healthier food choices Exercise is unchanged He is starting to feel his weight loss and is motivated to continue to make positive changes  Pharmacotherapy: Wegovy 0.25 mg weekly  PHYSICAL EXAM:  Blood pressure 124/82, pulse 70, temperature 98 F (36.7 C), height 5\' 6"  (1.676 m), weight (!) 455 lb (206.4 kg), SpO2 95%. Body mass index is 73.44 kg/m.  General: He is overweight, cooperative, alert, well developed, and in no acute distress. PSYCH: Has normal mood, affect and thought process.   Lungs: Normal breathing effort, no conversational dyspnea.   ASSESSMENT AND PLAN  TREATMENT PLAN FOR OBESITY:  Recommended Dietary Goals  Montavis is currently in the action stage of change. As such, his goal is to continue weight management plan. He has agreed to the Category 4 Plan.  Behavioral Intervention  We discussed the following Behavioral Modification Strategies today: increasing lean protein intake, decreasing simple carbohydrates , increasing vegetables, increasing lower glycemic fruits, increasing water intake, keeping healthy  foods at home, continue to work on implementation of reduced calorie nutritional plan, continue to practice mindfulness when eating, and planning for success.  Additional resources provided today: NA  Recommended Physical Activity Goals  Rakeim has been advised to work up to 150 minutes of moderate intensity aerobic activity a week and strengthening exercises 2-3 times per week for cardiovascular health, weight loss maintenance and preservation of muscle mass.   He has agreed to Think about ways to increase daily physical activity and overcoming barriers to exercise  Pharmacotherapy changes for the treatment of obesity:  increase Wegovy to 0.5 mg weekly  ASSOCIATED CONDITIONS ADDRESSED TODAY  Coronary artery disease involving native coronary artery of native heart without angina pectoris Assessment & Plan: He denies CP or DOE and is actively working on weight reduction, better food choices and will plan to ramp up physical activity.  He started [facility] by cardiology for CAD indication and is doing very well on it without GI upset.  Will increase his Wegovy to 0.5 mg weekly injection Continue to work on limiting high saturated fat foods  Orders: -     QVZDGL; Inject 0.5 mg into the skin once a week.  Dispense: 2 mL; Refill: 0  Vitamin D deficiency Assessment & Plan: Last vitamin D Lab Results  Component Value Date   VD25OH 26.4 (L) 10/20/2022   He has been taking RX vitamin D weekly.  Energy levels are improving. Repeat vitamin D level next visit  Orders: -     Vitamin D (Ergocalciferol); Take 1 capsule (  50,000 Units total) by mouth every 7 (seven) days.  Dispense: 5 capsule; Refill: 0  Pre-diabetes Assessment & Plan: Lab Results  Component Value Date   HGBA1C 6.0 (H) 10/20/2022   He is doing well on metformin 500 mg bid without adverse SE.   He is actively working on weight loss, reducing intake of sugar.  Has room for improvement with physical activity.  Plan to repeat  A1c next visit  Orders: -     metFORMIN HCl; Take 1 tablet (500 mg total) by mouth 2 (two) times daily with a meal.  Dispense: 60 tablet; Refill: 0  Morbid obesity (HCC) with starting BMI 75  BMI 70 and over, adult (HCC)  Sleep-disordered breathing Assessment & Plan: He is scheduled to see Dr Tresa Endo back for eval of OSA.  He is working on improving sleep time to 7-8 hrs and his wife reports that he is sleeping better.  He has some daytime somnelence and is high risk for OSA with his body habitus.  Continue to actively work on weight reduction Keep upcoming visit with Dr Tresa Endo       He was informed of the importance of frequent follow up visits to maximize his success with intensive lifestyle modifications for his multiple health conditions.   ATTESTASTION STATEMENTS:  Reviewed by clinician on day of visit: allergies, medications, problem list, medical history, surgical history, family history, social history, and previous encounter notes pertinent to obesity diagnosis.   I have personally spent 30 minutes total time today in preparation, patient care, nutritional counseling and documentation for this visit, including the following: review of clinical lab tests; review of medical tests/procedures/services.      Glennis Brink, DO DABFM, DABOM [facility] [address] [place], [place] [address] [phone]

## 2023-02-19 ENCOUNTER — Other Ambulatory Visit: Payer: Self-pay

## 2023-02-20 NOTE — Assessment & Plan Note (Signed)
He denies CP or DOE and is actively working on weight reduction, better food choices and will plan to ramp up physical activity.  He started [facility] by cardiology for CAD indication and is doing very well on it without GI upset.  Will increase his Wegovy to 0.5 mg weekly injection Continue to work on limiting high saturated fat foods

## 2023-02-20 NOTE — Assessment & Plan Note (Signed)
Last vitamin D Lab Results  Component Value Date   VD25OH 26.4 (L) 10/20/2022   He has been taking RX vitamin D weekly.  Energy levels are improving. Repeat vitamin D level next visit

## 2023-02-20 NOTE — Assessment & Plan Note (Signed)
He is scheduled to see Dr Tresa Endo back for eval of OSA.  He is working on improving sleep time to 7-8 hrs and his wife reports that he is sleeping better.  He has some daytime somnelence and is high risk for OSA with his body habitus.  Continue to actively work on weight reduction Keep upcoming visit with Dr Tresa Endo

## 2023-02-20 NOTE — Assessment & Plan Note (Signed)
Lab Results  Component Value Date   HGBA1C 6.0 (H) 10/20/2022   He is doing well on metformin 500 mg bid without adverse SE.   He is actively working on weight loss, reducing intake of sugar.  Has room for improvement with physical activity.  Plan to repeat A1c next visit

## 2023-03-18 ENCOUNTER — Encounter (INDEPENDENT_AMBULATORY_CARE_PROVIDER_SITE_OTHER): Payer: Self-pay | Admitting: Internal Medicine

## 2023-03-18 ENCOUNTER — Other Ambulatory Visit: Payer: Self-pay

## 2023-03-18 ENCOUNTER — Ambulatory Visit (INDEPENDENT_AMBULATORY_CARE_PROVIDER_SITE_OTHER): Payer: 59 | Admitting: Internal Medicine

## 2023-03-18 VITALS — BP 117/72 | HR 80 | Temp 97.9°F | Ht 66.0 in | Wt >= 6400 oz

## 2023-03-18 DIAGNOSIS — I2510 Atherosclerotic heart disease of native coronary artery without angina pectoris: Secondary | ICD-10-CM | POA: Diagnosis not present

## 2023-03-18 DIAGNOSIS — E559 Vitamin D deficiency, unspecified: Secondary | ICD-10-CM

## 2023-03-18 DIAGNOSIS — R7303 Prediabetes: Secondary | ICD-10-CM

## 2023-03-18 DIAGNOSIS — K219 Gastro-esophageal reflux disease without esophagitis: Secondary | ICD-10-CM | POA: Diagnosis not present

## 2023-03-18 DIAGNOSIS — R29818 Other symptoms and signs involving the nervous system: Secondary | ICD-10-CM | POA: Insufficient documentation

## 2023-03-18 DIAGNOSIS — Z6841 Body Mass Index (BMI) 40.0 and over, adult: Secondary | ICD-10-CM

## 2023-03-18 MED ORDER — VITAMIN D (ERGOCALCIFEROL) 1.25 MG (50000 UNIT) PO CAPS
50000.0000 [IU] | ORAL_CAPSULE | ORAL | 0 refills | Status: DC
Start: 2023-03-18 — End: 2023-04-14

## 2023-03-18 MED ORDER — WEGOVY 0.5 MG/0.5ML ~~LOC~~ SOAJ
0.5000 mg | SUBCUTANEOUS | 0 refills | Status: DC
Start: 2023-03-18 — End: 2023-04-14
  Filled 2023-03-18: qty 2, 28d supply, fill #0

## 2023-03-18 MED ORDER — PANTOPRAZOLE SODIUM 40 MG PO TBEC
40.0000 mg | DELAYED_RELEASE_TABLET | Freq: Every day | ORAL | 0 refills | Status: DC
Start: 2023-03-18 — End: 2023-06-23

## 2023-03-18 NOTE — Assessment & Plan Note (Signed)
Most recent vitamin D levels  Lab Results  Component Value Date   VD25OH 26.4 (L) 10/20/2022   VD25OH 32.4 05/07/2022     Deficiency state associated with adiposity and may result in leptin resistance, weight gain and fatigue. Currently on vitamin D supplementation without any adverse effects.  Plan: Continue high-dose vitamin D supplementation for another 4 weeks we will consider repeating vitamin D and transitioning to over-the-counter supplement.

## 2023-03-18 NOTE — Assessment & Plan Note (Signed)
Reviewed medication list.  He is currently on antiplatelet therapy moderate intensity statin.  ARB nitroglycerin.  I do not see a beta-blocker.  He is now on Wegovy and is currently asymptomatic.  He will continue medication and cardiac medications.  Patient is new to me I will review records further and assist with cardiovascular risks.  Blood pressure is well-controlled.  He also appears to be euvolemic.

## 2023-03-18 NOTE — Assessment & Plan Note (Signed)
Most recent A1c is  Lab Results  Component Value Date   HGBA1C 6.0 (H) 10/20/2022   HGBA1C 5.6 05/03/2017    Patient aware of disease state and risk of progression. This may contribute to abnormal cravings, fatigue and diabetic complications without having diabetes.   We have discussed treatment options which include: losing 7 to 10% of body weight, increasing physical activity to a goal of 150 minutes a week at moderate intensity.  Advised to maintain a diet low on simple and processed carbohydrates.  Continue Wegovy 0.5 mg once a week for pharmacoprophylaxis

## 2023-03-18 NOTE — Progress Notes (Signed)
Office: [phone]  /  Fax: [phone]  WEIGHT SUMMARY AND BIOMETRICS  Vitals Temp: 97.9 F (36.6 C) BP: 117/72 Pulse Rate: 80 SpO2: 93 %   Anthropometric Measurements Height: 5\' 6"  (1.676 m) Weight: (!) 449 lb (203.7 kg) BMI (Calculated): 72.51 Weight at Last Visit: 455 lb Weight Lost Since Last Visit: 6 lb Weight Gained Since Last Visit: 0 lb Starting Weight: 465 lb Total Weight Loss (lbs): 16 lb (7.258 kg)   Body Composition  Body Fat %: 56.1 % Fat Mass (lbs): 252.4 lbs Muscle Mass (lbs): 187.8 lbs Visceral Fat Rating : 56    No data recorded Today's Visit #: 11  Starting Date: 05/17/22   HPI  Chief Complaint: OBESITY  Jacob Hunt is here to discuss his progress with his obesity treatment plan. He is on the the Category 4 Plan and states he is following his eating plan approximately 60-65 % of the time. He states he is not exercising.  Interval History:  This is my first encounter with Jacob Hunt.  He is a pleasant 52 year old male who is affected by obesity.  Has a history of coronary artery disease, prediabetes, suspected OSA and diastolic heart failure.  He is scheduled to have a sleep study.  He is accompanied by his wife he works in Editor, commissioning for [employer].  He notes he started to gain weight when he started this job as he spends a lot of time on a tractor.  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  He also notes having problems with his weight since he was young.  Since last office visit he has lost 6 pounds.  He reports fair adherence to reduced calorie nutritional plan.  Has problems sometimes with lunch because of work schedule.  He also acknowledges not drinking enough water.  He reports feeling, weak and fatigued in the afternoon.  He also notices increased hunger in the evening which makes poor for control little bit difficult.  At times this may make him feel sick to his stomach as he is on incretin therapy.  He is currently on Wegovy 0.5 mg once a  week and has been on medication now for 4 weeks. He has been working on increasing protein intake at every meal, making healthier choices, working on meal prepping, reducing portion sizes, and controlling orexigenic cues and stimuli  Orexigenic Control: Denies problems with appetite and hunger signals.  Denies problems with satiety and satiation.  Reports problems with eating patterns and portion control.  Denies abnormal cravings. Denies feeling deprived or restricted.   Barriers identified: strong hunger signals and appetite, having difficulty with meal prep and planning, low volume of physical acitivity, medical comorbidities, work schedule, difficulty implementing reduced calorie nutrition plan, and sleep apnea.   Pharmacotherapy for weight loss: He is currently taking Wegovy with adequate clinical response  and without side effects..    ASSESSMENT AND PLAN  TREATMENT PLAN FOR OBESITY:  Recommended Dietary Goals  Jacob Hunt is currently in the action stage of change. As such, his goal is to continue weight management plan. He has agreed to: continue current plan  Behavioral Intervention  We discussed the following Behavioral Modification Strategies today: increasing lean protein intake, decreasing simple carbohydrates , increasing vegetables, increasing lower glycemic fruits, increasing fiber rich foods, avoiding skipping meals, increasing water intake, continue to practice mindfulness when eating, planning for success, and may use a meal replacement for lunch to avoid skipping meals .  Additional resources provided today: None  Recommended  Physical Activity Goals  Jacob Hunt has been advised to work up to 150 minutes of moderate intensity aerobic activity a week and strengthening exercises 2-3 times per week for cardiovascular health, weight loss maintenance and preservation of muscle mass.   He has agreed to :  Think about ways to increase daily physical activity and overcoming  barriers to exercise and Increase physical activity in their day and reduce sedentary time (increase NEAT).  Pharmacotherapy We discussed various medication options to help Jacob Hunt with his weight loss efforts and we both agreed to : continue current anti-obesity medication regimen  ASSOCIATED CONDITIONS ADDRESSED TODAY  Pre-diabetes Assessment & Plan: Most recent A1c is  Lab Results  Component Value Date   HGBA1C 6.0 (H) 10/20/2022   HGBA1C 5.6 05/03/2017    Patient aware of disease state and risk of progression. This may contribute to abnormal cravings, fatigue and diabetic complications without having diabetes.   We have discussed treatment options which include: losing 7 to 10% of body weight, increasing physical activity to a goal of 150 minutes a week at moderate intensity.  Advised to maintain a diet low on simple and processed carbohydrates.  Continue Wegovy 0.5 mg once a week for pharmacoprophylaxis   Orders: -     Hemoglobin A1c  Vitamin D deficiency Assessment & Plan: Most recent vitamin D levels  Lab Results  Component Value Date   VD25OH 26.4 (L) 10/20/2022   VD25OH 32.4 05/07/2022     Deficiency state associated with adiposity and may result in leptin resistance, weight gain and fatigue. Currently on vitamin D supplementation without any adverse effects.  Plan: Continue high-dose vitamin D supplementation for another 4 weeks we will consider repeating vitamin D and transitioning to over-the-counter supplement.   Orders: -     Vitamin D (Ergocalciferol); Take 1 capsule (50,000 Units total) by mouth every 7 (seven) days.  Dispense: 5 capsule; Refill: 0 -     VITAMIN D 25 Hydroxy (Vit-D Deficiency, Fractures)  Coronary artery disease involving native coronary artery of native heart without angina pectoris Assessment & Plan: Reviewed medication list.  He is currently on antiplatelet therapy moderate intensity statin.  ARB nitroglycerin.  I do not see a  beta-blocker.  He is now on Wegovy and is currently asymptomatic.  He will continue medication and cardiac medications.  Patient is new to me I will review records further and assist with cardiovascular risks.  Blood pressure is well-controlled.  He also appears to be euvolemic.  Orders: -     Wegovy; Inject 0.5 mg into the skin once a week.  Dispense: 2 mL; Refill: 0  Gastroesophageal reflux disease without esophagitis Assessment & Plan: Improved on PPI.  Continue pantoprazole consider tapering medication off.  Review records for duration of treatment and previous endoscopic evaluation.  No red flag symptoms at present time incretin therapy may worsen acid reflux we discussed nutritional modifications.  Orders: -     Pantoprazole Sodium; Take 1 tablet (40 mg total) by mouth daily.  Dispense: 90 tablet; Refill: 0  Morbid obesity (HCC) with starting BMI 75 Assessment & Plan: Patient has lost 21 pounds or 8% of total body weight.  I will review biometrics at the next office visit to determine muscle to fat mass loss ratio.  I suspect he may not be getting the recommended amount of protein we discussed the importance of distributing calories throughout the day and consuming about 30 to 40 g per meal.  He will also benefit from  strengthening exercises to preserve muscle mass.  Continue Wegovy at current dose.   Suspected sleep apnea Assessment & Plan: Patient clearly symptomatic and has a high risk phenotype.  He is scheduled to have an evaluation for sleep study in the future.  He was provided with information regarding sleep apnea symptoms and his effect on weight.  Losing 15 to 20% of body weight may improve symptoms.     PHYSICAL EXAM:  Blood pressure 117/72, pulse 80, temperature 97.9 F (36.6 C), height 5\' 6"  (1.676 m), weight (!) 449 lb (203.7 kg), SpO2 93%. Body mass index is 72.47 kg/m.  General: He is overweight, cooperative, alert, well developed, and in no acute  distress. PSYCH: Has normal mood, affect and thought process.   HEENT: EOMI, sclerae are anicteric. Lungs: Normal breathing effort, no conversational dyspnea. Extremities: No edema.  Neurologic: No gross sensory or motor deficits. No tremors or fasciculations noted.    DIAGNOSTIC DATA REVIEWED:  BMET    Component Value Date/Time   NA 140 10/20/2022 1627   K 4.5 10/20/2022 1627   CL 100 10/20/2022 1627   CO2 21 10/20/2022 1627   GLUCOSE 101 (H) 10/20/2022 1627   GLUCOSE 118 (H) 03/15/2017 1139   BUN 14 10/20/2022 1627   CREATININE 0.87 10/20/2022 1627   CALCIUM 9.4 10/20/2022 1627   GFRNONAA 103 04/08/2020 0747   GFRAA 119 04/08/2020 0747   Lab Results  Component Value Date   HGBA1C 6.0 (H) 10/20/2022   HGBA1C 5.6 05/03/2017   Lab Results  Component Value Date   INSULIN 34.6 (H) 05/07/2022   Lab Results  Component Value Date   TSH 2.090 05/07/2022   CBC    Component Value Date/Time   WBC 7.4 05/07/2022 0911   WBC 8.2 03/16/2017 0132   RBC 4.78 05/07/2022 0911   RBC 4.56 03/16/2017 0132   HGB 14.2 05/07/2022 0911   HCT 42.5 05/07/2022 0911   PLT 252 05/07/2022 0911   MCV 89 05/07/2022 0911   MCH 29.7 05/07/2022 0911   MCH 30.3 03/16/2017 0132   MCHC 33.4 05/07/2022 0911   MCHC 34.1 03/16/2017 0132   RDW 13.0 05/07/2022 0911   Iron Studies No results found for: "IRON", "TIBC", "FERRITIN", "IRONPCTSAT" Lipid Panel     Component Value Date/Time   CHOL 135 05/07/2022 0911   TRIG 108 05/07/2022 0911   HDL 44 05/07/2022 0911   CHOLHDL 3.1 05/07/2022 0911   CHOLHDL 5.2 03/16/2017 0132   VLDL 47 (H) 03/16/2017 0132   LDLCALC 71 05/07/2022 0911   Hepatic Function Panel     Component Value Date/Time   PROT 6.3 10/20/2022 1627   ALBUMIN 4.1 10/20/2022 1627   AST 21 10/20/2022 1627   ALT 26 10/20/2022 1627   ALKPHOS 107 10/20/2022 1627   BILITOT 0.7 10/20/2022 1627   BILIDIR 0.13 12/09/2021 1458      Component Value Date/Time   TSH 2.090 05/07/2022  0911   Nutritional Lab Results  Component Value Date   VD25OH 26.4 (L) 10/20/2022   VD25OH 32.4 05/07/2022     Return in about 3 weeks (around 04/08/2023) for Shawn in 3 weeks and Majestic Brister at 6 weeks.Marland Kitchen He was informed of the importance of frequent follow up visits to maximize his success with intensive lifestyle modifications for his multiple health conditions.   ATTESTASTION STATEMENTS:  Reviewed by clinician on day of visit: allergies, medications, problem list, medical history, surgical history, family history, social history, and previous encounter notes.  I have spent 40 minutes in the care of the patient today including: preparing to see patient (e.g. review and interpretation of tests, old notes ), obtaining and/or reviewing separately obtained history, performing a medically appropriate examination or evaluation, counseling and educating the patient, documenting clinical information in the electronic or other health care record, and independently interpreting results and communicating results to the patient, family, or caregiver   Worthy Rancher, MD

## 2023-03-18 NOTE — Assessment & Plan Note (Signed)
Patient clearly symptomatic and has a high risk phenotype.  He is scheduled to have an evaluation for sleep study in the future.  He was provided with information regarding sleep apnea symptoms and his effect on weight.  Losing 15 to 20% of body weight may improve symptoms.

## 2023-03-18 NOTE — Assessment & Plan Note (Signed)
Improved on PPI.  Continue pantoprazole consider tapering medication off.  Review records for duration of treatment and previous endoscopic evaluation.  No red flag symptoms at present time incretin therapy may worsen acid reflux we discussed nutritional modifications.

## 2023-03-18 NOTE — Assessment & Plan Note (Signed)
Patient has lost 21 pounds or 8% of total body weight.  I will review biometrics at the next office visit to determine muscle to fat mass loss ratio.  I suspect he may not be getting the recommended amount of protein we discussed the importance of distributing calories throughout the day and consuming about 30 to 40 g per meal.  He will also benefit from strengthening exercises to preserve muscle mass.  Continue Wegovy at current dose.

## 2023-03-19 ENCOUNTER — Other Ambulatory Visit: Payer: Self-pay

## 2023-03-19 LAB — HEMOGLOBIN A1C
Est. average glucose Bld gHb Est-mCnc: 117 mg/dL
Hgb A1c MFr Bld: 5.7 % — ABNORMAL HIGH (ref 4.8–5.6)

## 2023-03-19 LAB — VITAMIN D 25 HYDROXY (VIT D DEFICIENCY, FRACTURES): Vit D, 25-Hydroxy: 43.8 ng/mL (ref 30.0–100.0)

## 2023-04-06 ENCOUNTER — Ambulatory Visit: Payer: 59 | Attending: Cardiovascular Disease | Admitting: Cardiovascular Disease

## 2023-04-06 DIAGNOSIS — I2089 Other forms of angina pectoris: Secondary | ICD-10-CM

## 2023-04-06 DIAGNOSIS — G4733 Obstructive sleep apnea (adult) (pediatric): Secondary | ICD-10-CM

## 2023-04-06 DIAGNOSIS — I10 Essential (primary) hypertension: Secondary | ICD-10-CM | POA: Diagnosis not present

## 2023-04-06 DIAGNOSIS — R600 Localized edema: Secondary | ICD-10-CM

## 2023-04-06 DIAGNOSIS — I5032 Chronic diastolic (congestive) heart failure: Secondary | ICD-10-CM

## 2023-04-06 DIAGNOSIS — I2510 Atherosclerotic heart disease of native coronary artery without angina pectoris: Secondary | ICD-10-CM

## 2023-04-06 DIAGNOSIS — G4730 Sleep apnea, unspecified: Secondary | ICD-10-CM

## 2023-04-06 DIAGNOSIS — Z8249 Family history of ischemic heart disease and other diseases of the circulatory system: Secondary | ICD-10-CM

## 2023-04-06 MED ORDER — METOLAZONE 2.5 MG PO TABS: 2.5000 mg | ORAL_TABLET | Freq: Every day | ORAL | 1 refills | Status: DC

## 2023-04-06 NOTE — Patient Instructions (Addendum)
Medication Instructions:  Continue to take your Furosemide 40mg . Take one tablet daily  Begin the Metolazone 2.5mg . Take one tablet ONCE A WEEK 30 minutes before you take the Furosemide 40mg .  *If you need a refill on your cardiac medications before your next appointment, please call your pharmacy*   Lab Work: None ordered today If you have labs (blood work) drawn today and your tests are completely normal, you will receive your results only by: MyChart Message (if you have MyChart) OR A paper copy in the mail If you have any lab test that is abnormal or we need to change your treatment, we will call you to review the results.   Testing/Procedures: Your physician has recommended that you have a sleep study. This test records several body functions during sleep, including: brain activity, eye movement, oxygen and carbon dioxide blood levels, heart rate and rhythm, breathing rate and rhythm, the flow of air through your mouth and nose, snoring, body muscle movements, and chest and belly movement.    Follow-Up: At [facility], you and your health needs are our priority.  As part of our continuing mission to provide you with exceptional heart care, we have created designated Provider Care Teams.  These Care Teams include your primary Cardiologist (physician) and Advanced Practice Providers (APPs -  Physician Assistants and Nurse Practitioners) who all work together to provide you with the care you need, when you need it.  We recommend signing up for the patient portal called "MyChart".  Sign up information is provided on this After Visit Summary.  MyChart is used to connect with patients for Virtual Visits (Telemedicine).  Patients are able to view lab/test results, encounter notes, upcoming appointments, etc.  Non-urgent messages can be sent to your provider as well.   To learn more about what you can do with MyChart, go to [email or web address].    Your next appointment:   3  month(s) you will return for 3 month sleep compliance follow up visit after you have used your c-pap machine for 90 days.  Provider:   Dr. Nicki Guadalajara   Other Instructions Reduce sugary drinks and foods

## 2023-04-06 NOTE — Progress Notes (Signed)
Cardiology Office Note    Date:  04/12/2023   ID:  Jacob Hunt, DOB Feb 14, 1971, MRN [id number]  PCP:  Irven Coe, MD  Cardiologist:  Nicki Guadalajara, MD (sleep); Dr. Allyson Sabal  New sleep consultation referred by Dr. Worthy Rancher   History of Present Illness:  Jacob Hunt is a 52 y.o. male who has seen Dr. Allyson Sabal for cardiology care and remotely had seen Dr. Delton See.  He has a history of hypertension, hyperlipidemia, strong premature family history for heart disease in both parents and a brother as well as a history of super morbid obesity.  Remotely, he had undergone 2 prior cardiac catheterizations with his last catheterization on March 15, 2017 revealing widely patent coronary arteries with mild ectasia of the proximal LAD and minimal nonobstructive disease involving the proximal RCA, unchanged from his 2017 cath.  He had normal LV systolic function.  An echo Doppler study on March 16, 2017 showed mild focal basal hypertrophy of the septum with normal systolic function EF 55 to 60%.  There were no regional wall motion abnormalities.  There was grade 1 diastolic dysfunction.  When last seen by Dr. Allyson Sabal in June 2023 BMI was 74.  His peak weight was approximately 480 pounds.  He was referred to [facility] for evaluation of his severe super morbid obesity.  He was on a category 4 plan he was not exercising.  He was ultimately started on Wegovy.  He was seen by Joni Reining, NP for cardiology evaluation on January 01, 2023.  At that time, his most recent weight was 467 pounds.  He was last seen in [facility] by Dr. Rikki Spearing on March 18, 2023 and his weight was 449 pounds.  He was adhering to reduced calorie nutrition plan.  He works for the city of [place] and previously had been cutting grass with a lawnmower.  However over the past year he has been sitting on a tractor all day cutting grass, not walking, and previously he was drinking more  than 6 Coca-Cola's a day (not diet Coke).  When last seen at [facility] there was concern for significant obstructive sleep apnea based on his super morbid obesity and he is now referred for sleep consultation and evaluation.  Upon further questioning, Jacob Hunt states that he does not eat lunch.  He does not exercise.  When he graduated from high school at age 89 he weighed 230 pounds.  When he was married 14 years ago he weighed 300 pounds.  His peak weight was 480.  He is unaware of snoring.  He typically goes to bed at 11 PM and wakes up at 4:30 AM to be at work at 6:30 AM.  He works from 6:30 AM until 5 PM 4 days/week.  He admits to significant leg swelling bilaterally.  He has a prescription for furosemide 40 mg daily but he says he may take this 1-2 times per week in addition to his losartan which he takes 50 mg daily.  He is on atorvastatin 80 mg and Lovaza 2 capsules twice a day for mixed hyperlipidemia.  He is on pantoprazole for GERD.  He currently is on Wegovy 0.5 mg weekly injection.  He presents for his initial sleep consultation and evaluation.   Past Medical History:  Diagnosis Date   Back pain    CAD (coronary artery disease)    non-obstructive CAD on LHC in 03/2017   Chronic diastolic CHF (congestive  heart failure) (HCC)    Chronic lower extremity edema    Essential hypertension    GERD (gastroesophageal reflux disease)    Heartburn    Hyperlipidemia    Joint pain    Lumbar herniated disc    Morbid obesity (HCC)    Swelling     Past Surgical History:  Procedure Laterality Date   CARDIAC CATHETERIZATION N/A 08/02/2015   Procedure: Left Heart Cath and Coronary Angiography;  Surgeon: Peter M Swaziland, MD;  Location: [facility] INVASIVE CV LAB;  Service: Cardiovascular;  Laterality: N/A;   CARDIAC CATHETERIZATION  03/15/2017   LEFT HEART CATH AND CORONARY ANGIOGRAPHY N/A 03/15/2017   Procedure: LEFT HEART CATH AND CORONARY ANGIOGRAPHY;  Surgeon: Tonny Bollman, MD;   Location: [facility] INVASIVE CV LAB;  Service: Cardiovascular;  Laterality: N/A;    Current Medications: Outpatient Medications Prior to Visit  Medication Sig Dispense Refill   aspirin EC 81 MG tablet Take 1 tablet (81 mg total) by mouth daily. 30 tablet 0   atorvastatin (LIPITOR) 80 MG tablet Take 1 tablet (80 mg total) by mouth daily at 6 PM. 90 tablet 3   Cholecalciferol 125 MCG (5000 UT) capsule Take 5,000 Units by mouth daily.     furosemide (LASIX) 40 MG tablet Take 1 tablet (40 mg total) by mouth daily. 90 tablet 3   losartan (COZAAR) 50 MG tablet Take 1 tablet (50 mg total) by mouth daily. 90 tablet 3   metFORMIN (GLUCOPHAGE) 500 MG tablet Take 1 tablet (500 mg total) by mouth 2 (two) times daily with a meal. 60 tablet 0   nitroGLYCERIN (NITROSTAT) 0.4 MG SL tablet Place 1 tablet (0.4 mg total) under the tongue every 5 (five) minutes x 3 doses as needed for chest pain. 25 tablet 5   omega-3 acid ethyl esters (LOVAZA) 1 g capsule Take 2 capsules (2 g total) by mouth daily. 180 capsule 3   pantoprazole (PROTONIX) 40 MG tablet Take 1 tablet (40 mg total) by mouth daily. 90 tablet 0   Semaglutide-Weight Management (WEGOVY) 0.5 MG/0.5ML SOAJ Inject 0.5 mg into the skin once a week. 2 mL 0   Vitamin D, Ergocalciferol, (DRISDOL) 1.25 MG (50000 UNIT) CAPS capsule Take 1 capsule (50,000 Units total) by mouth every 7 (seven) days. 5 capsule 0   No facility-administered medications prior to visit.     Allergies:   Patient has no known allergies.   Social History   Socioeconomic History   Marital status: Married    Spouse name: beverly   Number of children: Not on file   Years of education: Not on file   Highest education level: Not on file  Occupational History   Not on file  Tobacco Use   Smoking status: Never   Smokeless tobacco: Never  Vaping Use   Vaping status: Never Used  Substance and Sexual Activity   Alcohol use: No    Alcohol/week: 0.0 standard drinks of alcohol   Drug use:  No   Sexual activity: Yes  Other Topics Concern   Not on file  Social History Narrative   Not on file   Social Determinants of Health   Financial Resource Strain: Not on file  Food Insecurity: Not on file  Transportation Needs: Not on file  Physical Activity: Not on file  Stress: Not on file  Social Connections: Not on file    Socially he is married for 14 years.  Currently no children but he and his wife are trying.  He  completed 12th grade of education.  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  He does not exercise.  There is no tobacco or drug use.  Family History:  The patient's family history includes CAD in his brother, father, and mother; Cancer in his father, maternal aunt, maternal grandfather, and mother; Coronary artery disease in his maternal grandfather; Diabetes in his brother, father, and mother; Hyperlipidemia in his father and mother; Hypertension in his brother, father, and mother; Obesity in his mother; Other in an other family member.   Both parents are living, mother at age 31 who has suffered an MI and has diabetes, and father age 66 who also had an MI and has diabetes.  A brother age 97 had a heart attack at age 59.  He has 1 sister age 66.   ROS General: Negative; No fevers, chills, or night sweats; longstanding obesity HEENT: Negative; No changes in vision or hearing, sinus congestion, difficulty swallowing Pulmonary: Negative; No cough, wheezing, shortness of breath, hemoptysis Cardiovascular: No chest pain: Bilateral leg swelling to below the knees GI: Negative; No nausea, vomiting, diarrhea, or abdominal pain GU: Negative; No dysuria, hematuria, or difficulty voiding Musculoskeletal: S1-S2 herniated disc; arthritis left knee Hematologic/Oncology: Negative; no easy bruising, bleeding Endocrine: Negative; no heat/cold intolerance; no diabetes Neuro: Negative; no changes in balance, headaches Skin: Negative; No rashes or skin  lesions Psychiatric: Negative; No behavioral problems, depression Sleep: Negative; No snoring, daytime sleepiness, hypersomnolence, bruxism, restless legs, hypnogognic hallucinations, no cataplexy Other comprehensive 14 point system review is negative.   PHYSICAL EXAM:   VS:  BP 126/86   Pulse 71   Ht 5\' 6"  (1.676 m)   Wt (!) 462 lb (209.6 kg)   SpO2 97%   BMI 74.57 kg/m      Wt Readings from Last 3 Encounters:  04/06/23 (!) 462 lb (209.6 kg)  03/18/23 (!) 449 lb (203.7 kg)  02/18/23 (!) 455 lb (206.4 kg)    General: Alert, oriented, no distress.  Super morbid obesity with BMI 74.57 Skin: normal turgor, no rashes, warm and dry HEENT: Normocephalic, atraumatic. Pupils equal round and reactive to light; sclera anicteric; extraocular muscles intact;  Nose without nasal septal hypertrophy Mouth/Parynx benign; Mallinpatti scale 3 Neck: Very enlarged neck;no JVD, no carotid bruits; normal carotid upstroke Lungs: clear to ausculatation and percussion; no wheezing or rales Chest wall: without tenderness to palpitation Heart: PMI not displaced, RRR, s1 s2 normal, 1/6 systolic murmur, no diastolic murmur, no rubs, gallops, thrills, or heaves Abdomen: Severe central adiposity soft, nontender; no hepatosplenomehaly, BS+; abdominal aorta nontender and not dilated by palpation. Back: no CVA tenderness Pulses 2+ Musculoskeletal: full range of motion, normal strength, no joint deformities Extremities: 4+ bilateral lower extremity edema extending to below the knee Neurologic: grossly nonfocal; Cranial nerves grossly wnl Psychologic: Normal mood and affect   Studies/Labs Reviewed:   EKG Interpretation Date/Time:  Tuesday April 06 2023 16:19:22 EDT Ventricular Rate:  71 PR Interval:  222 QRS Duration:  94 QT Interval:  394 QTC Calculation: 428 R Axis:   -38  Text Interpretation: Sinus rhythm with 1st degree A-V block Left axis deviation When compared with ECG of 01-Jan-2023 15:41, PR  interval has increased Confirmed by Nicki Guadalajara ([id number]) on 04/06/2023 5:38:00 PM    Recent Labs:    Latest Ref Rng & Units 10/20/2022    4:27 PM 05/07/2022    9:11 AM 06/19/2021    8:41 AM  BMP  Glucose 70 - 99 mg/dL  101  115  104   BUN 6 - 24 mg/dL 14  14  14    Creatinine 0.76 - 1.27 mg/dL 2.95  6.21  3.08   BUN/Creat Ratio 9 - 20 16  17  16    Sodium 134 - 144 mmol/L 140  141  139   Potassium 3.5 - 5.2 mmol/L 4.5  4.6  4.6   Chloride 96 - 106 mmol/L 100  100  103   CO2 20 - 29 mmol/L 21  23  23    Calcium 8.7 - 10.2 mg/dL 9.4  9.5  9.1         Latest Ref Rng & Units 10/20/2022    4:27 PM 05/07/2022    9:11 AM 12/09/2021    2:58 PM  Hepatic Function  Total Protein 6.0 - 8.5 g/dL 6.3  6.5  6.3   Albumin 4.1 - 5.1 g/dL 4.1  4.1  4.1   AST 0 - 40 IU/L 21  23  15    ALT 0 - 44 IU/L 26  22  17    Alk Phosphatase 44 - 121 IU/L 107  116  103   Total Bilirubin 0.0 - 1.2 mg/dL 0.7  0.8  0.5   Bilirubin, Direct 0.00 - 0.40 mg/dL   6.57        Latest Ref Rng & Units 05/07/2022    9:11 AM 04/08/2020    7:47 AM 01/16/2019    8:39 AM  CBC  WBC 3.4 - 10.8 x10E3/uL 7.4  7.0  7.2   Hemoglobin 13.0 - 17.7 g/dL 84.6  96.2  C 95.2   Hematocrit 37.5 - 51.0 % 42.5  41.4  C 43.8   Platelets 150 - 450 x10E3/uL 252  251  279     C Corrected result   Lab Results  Component Value Date   MCV 89 05/07/2022   MCV 88 04/08/2020   MCV 87 01/16/2019   Lab Results  Component Value Date   TSH 2.090 05/07/2022   Lab Results  Component Value Date   HGBA1C 5.7 (H) 03/18/2023     BNP No results found for: "BNP"  ProBNP    Component Value Date/Time   PROBNP 70 04/08/2020 0747     Lipid Panel     Component Value Date/Time   CHOL 135 05/07/2022 0911   TRIG 108 05/07/2022 0911   HDL 44 05/07/2022 0911   CHOLHDL 3.1 05/07/2022 0911   CHOLHDL 5.2 03/16/2017 0132   VLDL 47 (H) 03/16/2017 0132   LDLCALC 71 05/07/2022 0911   LABVLDL 20 05/07/2022 0911     RADIOLOGY: No results  found.   Additional studies/ records that were reviewed today include:  The patient's prior cardiac catheterizations, 2D echo Doppler study, cardiology evaluations, and [facility] evaluations were reviewed   ASSESSMENT:    1. Super Morbid obesity (HCC)   2. Essential hypertension   3. Evaluate for OSA (obstructive sleep apnea)   4. Sleep-disordered breathing   5. Coronary artery disease involving native coronary artery of native heart without angina pectoris   6. Chronic diastolic heart failure (HCC)   7. Bilateral lower extremity edema   8. Family history of premature CAD     PLAN:  Jacob Hunt is a 52 year old gentleman who has a longstanding history of super morbid obesity with peak weight at 480 pounds.  He has a history of premature family history for coronary artery disease with several family members having undergone stenting.  He has undergone 2 prior cardiac catheterizations in January 2017 and most recently September 2018 which revealed mild nonobstructive CAD.  He admits that he does not eat excessively.  He does not exercise.  Previously he had been cutting grass using a lawnmower for the city of [place] but over the past year has been all day riding a tractor not getting exercise and often was drinking at least 6 large Coca-Cola's with significant sugar content.  He has established care with [facility] and has been successful with weight loss of approximately 20 pounds so far.  He has a body habitus highly suggestive of significant sleep disordered breathing.  I had a very lengthy conversation with both he and his wife today regarding sleep apnea and its potential adverse consequences regarding disruptive sleep architecture and adverse cardiovascular consequences.  His wife is on CPAP therapy and uses CPAP daily.  She was very tuned in to learning about effects of sleep apnea regarding risk for difficult to control hypertension, increased  risk for nocturnal arrhythmias and in particular risk for atrial fibrillation with recurrent AF if sleep apnea is left untreated following cardioversion.  In addition I discussed its negative implications with reference to insulin resistance, increased inflammatory markers, as well as increased nocturnal GERD.  I discussed sleeping on his left side rather than right which will also improve nocturnal GERD.  In addition, I discussed potential nocturnal hypoxemia contributing to nocturnal ischemia if underlying cardiac or cerebrovascular disease is present.  Presently, he is sleeping an adequate sleep duration with his going to bed at 11 PM and waking up at 4:30 AM.  I discussed optimal sleep duration at 7 and 9 hours.  With his exceedingly large body size, I have strongly recommended he undergo an in lab split-night evaluation and if he meets criteria for CPAP or possible BiPAP titration can be initiated during that initial night.  Patient states he has reservations about going to the laboratory since he cannot sleep anywhere but home.  We will see if he can pursue the in-lab study.  If he is unable home study will be necessary for initial assessment.  Presently, he has 4+ lower extremity edema and he is only intermittent taking his furosemide 40 mg dose.  I have suggested he take furosemide daily and with his extensive edema I have suggested 1 day/week he take metolazone 2.5 mg prior to his dose of furosemide.  I discussed the importance of exercise.  I had a lengthy discussion regarding his daily soda Coca-Cola and Mountain Dew intake.  I answered all his questions.  I will see him in 3 to 4 months for follow-up evaluation or sooner as needed.  Medication Adjustments/Labs and Tests Ordered: Current medicines are reviewed at length with the patient today.  Concerns regarding medicines are outlined above.  Medication changes, Labs and Tests ordered today are listed in the Patient Instructions below. Patient  Instructions  Medication Instructions:  Continue to take your Furosemide 40mg . Take one tablet daily  Begin the Metolazone 2.5mg . Take one tablet ONCE A WEEK 30 minutes before you take the Furosemide 40mg .  *If you need a refill on your cardiac medications before your next appointment, please call your pharmacy*   Lab Work: None ordered today If you have labs (blood work) drawn today and your tests are completely normal, you will receive your results only by: MyChart Message (if you have MyChart) OR A paper copy in the mail If you have any lab  test that is abnormal or we need to change your treatment, we will call you to review the results.   Testing/Procedures: Your physician has recommended that you have a sleep study. This test records several body functions during sleep, including: brain activity, eye movement, oxygen and carbon dioxide blood levels, heart rate and rhythm, breathing rate and rhythm, the flow of air through your mouth and nose, snoring, body muscle movements, and chest and belly movement.    Follow-Up: At [facility], you and your health needs are our priority.  As part of our continuing mission to provide you with exceptional heart care, we have created designated Provider Care Teams.  These Care Teams include your primary Cardiologist (physician) and Advanced Practice Providers (APPs -  Physician Assistants and Nurse Practitioners) who all work together to provide you with the care you need, when you need it.  We recommend signing up for the patient portal called "MyChart".  Sign up information is provided on this After Visit Summary.  MyChart is used to connect with patients for Virtual Visits (Telemedicine).  Patients are able to view lab/test results, encounter notes, upcoming appointments, etc.  Non-urgent messages can be sent to your provider as well.   To learn more about what you can do with MyChart, go to [email or web address].    Your next  appointment:   3 month(s) you will return for 3 month sleep compliance follow up visit after you have used your c-pap machine for 90 days.  Provider:   Dr. Nicki Guadalajara   Other Instructions Reduce sugary drinks and foods    Signed, Nicki Guadalajara, MD  04/12/2023 11:41 AM    [facility] [address], [address], [place], [place]  [address] Phone: [phone]

## 2023-04-07 ENCOUNTER — Other Ambulatory Visit: Payer: Self-pay

## 2023-04-12 ENCOUNTER — Encounter: Payer: Self-pay | Admitting: Cardiovascular Disease

## 2023-04-13 NOTE — Progress Notes (Unsigned)
.smr  Office: [phone]  /  Fax: [phone]  WEIGHT SUMMARY AND BIOMETRICS  Vitals Temp: 97.9 F (36.6 C) BP: 106/70 Pulse Rate: 91 SpO2: 97 %   Anthropometric Measurements Height: 5\' 6"  (1.676 m) Weight: (!) 451 lb (204.6 kg) BMI (Calculated): 72.83 Weight at Last Visit: 449 lb Weight Lost Since Last Visit: 0 Weight Gained Since Last Visit: 2 lb Starting Weight: 449 lb Total Weight Loss (lbs): 14 lb (6.35 kg) Peak Weight: 480 lb   Body Composition  Body Fat %: 57.5 % Fat Mass (lbs): 259.6 lbs Muscle Mass (lbs): 182.6 lbs Visceral Fat Rating : 58   Other Clinical Data Fasting: no Labs: no Today's Visit #: 12 Starting Date: 05/07/22     HPI  Chief Complaint: OBESITY  Jacob Hunt is here to discuss his progress with his obesity treatment plan. He is on the the Category 4 Plan and states he is following his eating plan approximately 60-65 % of the time. He states he is exercising 0 minutes 0 times per week.  Discussed the use of AI scribe software for clinical note transcription with the patient, who gave verbal consent to proceed.  History of Present Illness  /    Interval History:  Since last office visit he is up 2 lbs.    A 52 year old gentleman here with his wife with a history of morbid obesity, hypertension, possible obstructive sleep apnea, coronary artery disease, chronic diastolic heart failure, and hyperlipidemia presents for a follow-up of his obesity treatment plan.  The patient reports recent weight loss and is motivated to continue with the weight loss plan. The patient has been experiencing constipation and we discussed management of constipation. He reports better overall adherence and trying not to skip meals. We discussed the importance of regular meals and adequate protein intake for weight loss and overall health. He was previously drinking numerous sweetened sodas daily and is now down to a maximum of 1 soda daily at the most. He is trying  to increase water intake, but is struggling some with water intake.  Exercise capacity is limited. Has history of back issues with two herniated discs in the lower back. The patient also has a history of arthritis in the left knee, which has improved with weight loss. He does feel he is more mobile than he has been in many years with his recent weight loss.  The patient also reports a history of fluid build-up in the legs. The patient has a family history of premature coronary disease. He reports some recent struggles due to his father being hospitalized, but is trying to stay on track with his nutrition plan .    Pharmacotherapy: Wegovy 0.5 mg once weekly . Denies mass in neck, dysphagia, dyspepsia, persistent hoarseness, abdominal pain, or N/V or diarrhea. Has annual eye exam. Mood is stable. Some constipation and discussed management noted in problem list.    TREATMENT PLAN FOR OBESITY: He has lost some muscle mass . Will continue current dose of Wegovy and monitor closely.  Recommended Dietary Goals  Jacob Hunt is currently in the action stage of change. As such, his goal is to continue weight management plan. He has agreed to the Category 4 Plan.  Behavioral Intervention  We discussed the following Behavioral Modification Strategies today: increasing water intake , work on meal planning and preparation, practice mindfulness eating and understand the difference between hunger signals and cravings, and continue to work on maintaining a reduced calorie state, getting the recommended amount of protein,  incorporating whole foods, making healthy choices, staying well hydrated and practicing mindfulness when eating..  Additional resources provided today: NA  Recommended Physical Activity Goals  Jacob Hunt has been advised to work up to 150 minutes of moderate intensity aerobic activity a week and strengthening exercises 2-3 times per week for cardiovascular health, weight loss maintenance and  preservation of muscle mass.   He has agreed to Continue current level of physical activity , Think about enjoyable ways to increase daily physical activity and overcoming barriers to exercise, and Increase physical activity in their day and reduce sedentary time (increase NEAT).   Pharmacotherapy We discussed various medication options to help Jacob Hunt with his weight loss efforts and we both agreed to continue Wegovy 0.5 mg weekly for management of CAD with significant family history of premature severe CAD, and hyperlipidemia.    Return in about 2 weeks (around 04/28/2023).Marland Kitchen He was informed of the importance of frequent follow up visits to maximize his success with intensive lifestyle modifications for his multiple health conditions.  PHYSICAL EXAM:  Blood pressure 106/70, pulse 91, temperature 97.9 F (36.6 C), height 5\' 6"  (1.676 m), weight (!) 451 lb (204.6 kg), SpO2 97%. Body mass index is 72.79 kg/m.  General: He is overweight, cooperative, alert, well developed, and in no acute distress. PSYCH: Has normal mood, affect and thought process.   Cardiovascular: HR 90's BP 106/70. Marked bilateral lower extremity edema. Lungs: Normal breathing effort, no conversational dyspnea. Neuro: no focal deficits  DIAGNOSTIC DATA REVIEWED:  BMET    Component Value Date/Time   NA 140 10/20/2022 1627   K 4.5 10/20/2022 1627   CL 100 10/20/2022 1627   CO2 21 10/20/2022 1627   GLUCOSE 101 (H) 10/20/2022 1627   GLUCOSE 118 (H) 03/15/2017 1139   BUN 14 10/20/2022 1627   CREATININE 0.87 10/20/2022 1627   CALCIUM 9.4 10/20/2022 1627   GFRNONAA 103 04/08/2020 0747   GFRAA 119 04/08/2020 0747   Lab Results  Component Value Date   HGBA1C 5.7 (H) 03/18/2023   HGBA1C 5.6 05/03/2017   Lab Results  Component Value Date   INSULIN 34.6 (H) 05/07/2022   Lab Results  Component Value Date   TSH 2.090 05/07/2022   CBC    Component Value Date/Time   WBC 7.4 05/07/2022 0911   WBC 8.2  03/16/2017 0132   RBC 4.78 05/07/2022 0911   RBC 4.56 03/16/2017 0132   HGB 14.2 05/07/2022 0911   HCT 42.5 05/07/2022 0911   PLT 252 05/07/2022 0911   MCV 89 05/07/2022 0911   MCH 29.7 05/07/2022 0911   MCH 30.3 03/16/2017 0132   MCHC 33.4 05/07/2022 0911   MCHC 34.1 03/16/2017 0132   RDW 13.0 05/07/2022 0911   Iron Studies No results found for: "IRON", "TIBC", "FERRITIN", "IRONPCTSAT" Lipid Panel     Component Value Date/Time   CHOL 135 05/07/2022 0911   TRIG 108 05/07/2022 0911   HDL 44 05/07/2022 0911   CHOLHDL 3.1 05/07/2022 0911   CHOLHDL 5.2 03/16/2017 0132   VLDL 47 (H) 03/16/2017 0132   LDLCALC 71 05/07/2022 0911   Hepatic Function Panel     Component Value Date/Time   PROT 6.3 10/20/2022 1627   ALBUMIN 4.1 10/20/2022 1627   AST 21 10/20/2022 1627   ALT 26 10/20/2022 1627   ALKPHOS 107 10/20/2022 1627   BILITOT 0.7 10/20/2022 1627   BILIDIR 0.13 12/09/2021 1458      Component Value Date/Time   TSH 2.090 05/07/2022 0911  Nutritional Lab Results  Component Value Date   VD25OH 43.8 03/18/2023   VD25OH 26.4 (L) 10/20/2022   VD25OH 32.4 05/07/2022    ASSOCIATED CONDITIONS ADDRESSED TODAY  ASSESSMENT AND PLAN  Problem List Items Addressed This Visit     Morbid obesity (HCC) with starting BMI 75   Relevant Medications   Semaglutide-Weight Management (WEGOVY) 0.5 MG/0.5ML SOAJ   CAD (coronary artery disease)   Relevant Medications   Semaglutide-Weight Management (WEGOVY) 0.5 MG/0.5ML SOAJ   Vitamin D deficiency   Pre-diabetes - Primary   Prediabetes Last A1c was 5.7- improved, but not at goal. Insulin 34.6-significantly elevated.   Medication(s): Wegovy 0.50 mg SQ weekly Polyphagia:No He is  working on nutrition plan to decrease simple carbohydrates, increase lean proteins and exercise to promote weight loss, improve glycemic control and prevent progression to Type 2 diabetes. ]Reports was previously skipping meals frequently and then over eating  and making poor food choices. Was previously drinking multiple sweetened sodas daily. Now limiting to 1 soda at the most daily.  Lab Results  Component Value Date   HGBA1C 5.7 (H) 03/18/2023   HGBA1C 6.0 (H) 10/20/2022   HGBA1C 5.9 (H) 05/07/2022   HGBA1C 5.6 05/03/2017   Lab Results  Component Value Date   INSULIN 34.6 (H) 05/07/2022    Plan: Continue and refill Wegovy 0.50 mg SQ weekly Continue working on nutrition plan to decrease simple carbohydrates, increase lean proteins and exercise to promote weight loss, improve glycemic control and prevent progression to Type 2 diabetes.   Vitamin D Deficiency Vitamin D is not at goal of 50.  Most recent vitamin D level was 43.8-  improving but not at goal. He is on OTC vitamin D3 5000 IU daily. Lab Results  Component Value Date   VD25OH 43.8 03/18/2023   VD25OH 26.4 (L) 10/20/2022   VD25OH 32.4 05/07/2022    Plan: Continue OTC vitamin D3 5000 IU daily Low vitamin D levels can be associated with adiposity and may result in leptin resistance and weight gain. Also associated with fatigue. Currently on vitamin D supplementation without any adverse effects.  Recheck vitamin D level several times yearly to optimize supplementation/avoid over supplementation.   Constipation Jacob Hunt notes constipation.   This is likely related to GLP-1.  He has been taking nothing for constipation currently. . Constipation is poorly controlled.   Plan: He did note increased bowel movements when on twice daily metformin and has not been taking the metformin consistently twice daily after starting [facility]. We discussed trying to take the metformin twice daily which may help to have more consistent bowel movements.  Increase fiber - start psyllium husk capsules 2-4 capsules daily and increased water. He has been instructed by cardiology to drink about 64 oz of fluids daily.  Add MiraLAX once daily if little results after starting the above.  Will monitor  closely.   Coronary Artery Disease/ Diastolic dysfunction He recently saw Dr. Tresa Endo primarily for evaluation for OSA and a sleep study has been ordered.  Notes from Dr. Tresa Endo 04/12/2023:  Jacob Hunt is a 52 year old gentleman who has a longstanding history of super morbid obesity with peak weight at 480 pounds. He has a history of premature family history for coronary artery disease with several family members having undergone stenting. He has undergone 2 prior cardiac catheterizations in January 2017 and most recently September 2018 which revealed mild nonobstructive CAD.  Dr. Tresa Endo noted:  4+ lower extremity edema and he is only intermittent taking  his furosemide 40 mg dose. I have suggested he take furosemide daily and with his extensive edema I have suggested 1 day/week he take metolazone 2.5 mg prior to his dose of furosemide.   Plan: Monitor response to medication changes for his lower extremity edema.    ATTESTASTION STATEMENTS:  Reviewed by clinician on day of visit: allergies, medications, problem list, medical history, surgical history, family history, social history, and previous encounter notes.   I have personally spent 50 minutes total time today in preparation, patient care, nutritional counseling and documentation for this visit, including the following: review of clinical lab tests; review of medical tests/procedures/services.      Ahlayah Tarkowski, PA-C

## 2023-04-14 ENCOUNTER — Other Ambulatory Visit: Payer: Self-pay

## 2023-04-14 ENCOUNTER — Encounter (INDEPENDENT_AMBULATORY_CARE_PROVIDER_SITE_OTHER): Payer: Self-pay | Admitting: Physician Assistant

## 2023-04-14 ENCOUNTER — Telehealth: Payer: Self-pay

## 2023-04-14 ENCOUNTER — Ambulatory Visit (INDEPENDENT_AMBULATORY_CARE_PROVIDER_SITE_OTHER): Payer: 59 | Admitting: Physician Assistant

## 2023-04-14 VITALS — BP 106/70 | HR 91 | Temp 97.9°F | Ht 66.0 in | Wt >= 6400 oz

## 2023-04-14 DIAGNOSIS — I2510 Atherosclerotic heart disease of native coronary artery without angina pectoris: Secondary | ICD-10-CM

## 2023-04-14 DIAGNOSIS — E559 Vitamin D deficiency, unspecified: Secondary | ICD-10-CM

## 2023-04-14 DIAGNOSIS — R7303 Prediabetes: Secondary | ICD-10-CM | POA: Diagnosis not present

## 2023-04-14 DIAGNOSIS — Z6841 Body Mass Index (BMI) 40.0 and over, adult: Secondary | ICD-10-CM | POA: Insufficient documentation

## 2023-04-14 MED ORDER — PSYLLIUM 400 MG PO CAPS
400.0000 mg | ORAL_CAPSULE | Freq: Every day | ORAL | 0 refills | Status: AC
  Filled 2023-04-14: qty 100, fill #0

## 2023-04-14 MED ORDER — WEGOVY 0.5 MG/0.5ML ~~LOC~~ SOAJ
0.5000 mg | SUBCUTANEOUS | 0 refills | Status: DC
Start: 2023-04-14 — End: 2023-04-29
  Filled 2023-04-14 – 2023-04-22 (×3): qty 2, 28d supply, fill #0

## 2023-04-14 NOTE — Telephone Encounter (Signed)
**Note De-Identified Hicks Feick Obfuscation** Per [facility] Portal: PENDED Reason: 1.Disposition pending review Tracking #: [id number]

## 2023-04-19 ENCOUNTER — Other Ambulatory Visit: Payer: Self-pay

## 2023-04-20 ENCOUNTER — Encounter: Payer: Self-pay | Admitting: Cardiovascular Disease

## 2023-04-21 ENCOUNTER — Telehealth: Payer: Self-pay | Admitting: Cardiovascular Disease

## 2023-04-21 MED ORDER — METOLAZONE 2.5 MG PO TABS: ORAL_TABLET | ORAL | 1 refills | Status: AC

## 2023-04-21 NOTE — Telephone Encounter (Signed)
Pt c/o medication issue:  1. Name of Medication:   metolazone (ZAROXOLYN) 2.5 MG tablet    2. How are you currently taking this medication (dosage and times per day)?  Take one tablet once a week 30 minutes before you take the Furosemide 40mg        3. Are you having a reaction (difficulty breathing--STAT)? No  4. What is your medication issue? Fayrene Fearing called from pharmacy requesting a callback from nurse for clarity on this medication and times per day. Please advise

## 2023-04-21 NOTE — Telephone Encounter (Signed)
Left message for patient to return call to office. 

## 2023-04-21 NOTE — Telephone Encounter (Signed)
Sent in new prescription- clarifying correct dosage. The pharmacy requested clarification  Original order read: Take 2.5 mg daily. Take once a week 30 minutes before you take the Furosemide 40 mg

## 2023-04-22 ENCOUNTER — Other Ambulatory Visit: Payer: Self-pay

## 2023-04-23 NOTE — Telephone Encounter (Signed)
Spoke with pt pharmacy, confirmed that there metolazone is once weekly.

## 2023-04-26 ENCOUNTER — Telehealth (INDEPENDENT_AMBULATORY_CARE_PROVIDER_SITE_OTHER): Payer: Self-pay

## 2023-04-26 NOTE — Telephone Encounter (Signed)
This request was denied because you did not meet the following requirements: Based on the information provided, you do not meet the established medication-specific criteria or guidelines for Wegovy at this time.  The request for coverage for WEGOVY INJ 0.5MG , use as directed (2 per month), is denied. This decision is based on health plan criteria for WEGOVY INJ 0.5MG . Reauthorization of this medicine is covered only if: You have a weight loss of greater than or equal to 5% of baseline body weight. The information provided does not show that you meet the criteria listed above. *Please note: This medication cannot be further reviewed for the quantity limit until the above criteria have been addressed.

## 2023-04-27 NOTE — Telephone Encounter (Signed)
Questions in cover my meds answered.  Patient is using this, which was started by Cardiology for cardiovascular issues.

## 2023-04-27 NOTE — Telephone Encounter (Signed)
Cover my meds asked for a resubmission.  Wegovy prior Berkley Harvey has been re-submitted.  Awaiting questions.

## 2023-04-27 NOTE — Telephone Encounter (Signed)
Jacob Hunt has been approved for up to 2ml per month through 04/26/2024 or until coverage for the medication is no longer available under your benefit plan or the medication becomes subject to a pharmacy benefit coverage requirement, such as supply limits or notification, whichever occurs first as allowed by law.

## 2023-04-29 ENCOUNTER — Ambulatory Visit (INDEPENDENT_AMBULATORY_CARE_PROVIDER_SITE_OTHER): Payer: 59 | Admitting: Internal Medicine

## 2023-04-29 ENCOUNTER — Encounter (INDEPENDENT_AMBULATORY_CARE_PROVIDER_SITE_OTHER): Payer: Self-pay | Admitting: Internal Medicine

## 2023-04-29 ENCOUNTER — Other Ambulatory Visit: Payer: Self-pay

## 2023-04-29 DIAGNOSIS — K219 Gastro-esophageal reflux disease without esophagitis: Secondary | ICD-10-CM | POA: Diagnosis not present

## 2023-04-29 DIAGNOSIS — I2510 Atherosclerotic heart disease of native coronary artery without angina pectoris: Secondary | ICD-10-CM

## 2023-04-29 DIAGNOSIS — Z6841 Body Mass Index (BMI) 40.0 and over, adult: Secondary | ICD-10-CM | POA: Diagnosis not present

## 2023-04-29 MED ORDER — SEMAGLUTIDE-WEIGHT MANAGEMENT 1 MG/0.5ML ~~LOC~~ SOAJ
1.0000 mg | SUBCUTANEOUS | 0 refills | Status: DC
Start: 2023-04-29 — End: 2023-05-24
  Filled 2023-04-29: qty 2, 28d supply, fill #0

## 2023-04-29 NOTE — Progress Notes (Signed)
Office: [phone]  /  Fax: [phone]  WEIGHT SUMMARY AND BIOMETRICS  Vitals Temp: (!) 97.5 F (36.4 C) BP: 134/85 Pulse Rate: 76 SpO2: 96 %   Anthropometric Measurements Height: 5\' 6"  (1.676 m) Weight: (!) 455 lb (206.4 kg) BMI (Calculated): 73.47 Weight at Last Visit: 451 lb Weight Lost Since Last Visit: 0 lb Weight Gained Since Last Visit: 4 lb Starting Weight: 449 lb Total Weight Loss (lbs): 10 lb (4.536 kg) Peak Weight: 480 lb   Body Composition  Body Fat %: 58.4 % Fat Mass (lbs): 265.8 lbs Muscle Mass (lbs): 180 lbs Total Body Water (lbs): 0 lbs Visceral Fat Rating : 59    No data recorded Today's Visit #: 13  Starting Date: 05/07/22   HPI  Chief Complaint: OBESITY  Jacob Hunt is here to discuss his progress with his obesity treatment plan. He is on the the Category 4 Plan and states he is following his eating plan approximately 60 % of the time. He states he is not exercising.  Interval History:  Since last office visit he has gained 4 pounds. He reports variable adherence to reduced calorie nutritional plan He has been working on not skipping meals, increasing protein intake at every meal, drinking more water, and making healthier choices.  Patient has been out of [facility] for almost 2 weeks.  Orexigenic Control: Reports problems with appetite and hunger signals.  Reports problems with satiety and satiation.  Reports problems with eating patterns and portion control.  Denies abnormal cravings. Denies feeling deprived or restricted.   Barriers identified: work schedule and eating pattern .   Pharmacotherapy for weight loss: He is currently taking Wegovy with adequate clinical response , without side effects., and treatment have been briefly interrupted due to insurance issues. .    ASSESSMENT AND PLAN  TREATMENT PLAN FOR OBESITY:  Recommended Dietary Goals  [name] is currently in the action stage of change. As such, his goal is to continue  weight management plan. He has agreed to: continue current plan  Behavioral Intervention  We discussed the following Behavioral Modification Strategies today: continue to work on maintaining a reduced calorie state, getting the recommended amount of protein, incorporating whole foods, making healthy choices, staying well hydrated and practicing mindfulness when eating..  Additional resources provided today: Provided with personal guidance and instructions on how to use [employer] for healthy meal ideas and cooking in bulk.  Recommended Physical Activity Goals  [name] has been advised to work up to 150 minutes of moderate intensity aerobic activity a week and strengthening exercises 2-3 times per week for cardiovascular health, weight loss maintenance and preservation of muscle mass.   He has agreed to :  Think about enjoyable ways to increase daily physical activity and overcoming barriers to exercise and Increase physical activity in their day and reduce sedentary time (increase NEAT).  Pharmacotherapy We discussed various medication options to help [name] with his weight loss efforts and we both agreed to : increase Wegovy to 1 mg once a week  ASSOCIATED CONDITIONS ADDRESSED TODAY  Morbid obesity (HCC) with starting BMI 75 Assessment & Plan: See obesity treatment plan   Gastroesophageal reflux disease without esophagitis Assessment & Plan: Improved on PPI.  Continue pantoprazole consider tapering medication off.  Review records for duration of treatment and previous endoscopic evaluation.  No red flag symptoms at present time incretin therapy may worsen acid reflux we discussed nutritional modifications.   Coronary artery disease involving native coronary artery of native heart without  angina pectoris Assessment & Plan: Reviewed medication list.  He is currently on antiplatelet therapy moderate intensity statin.  ARB nitroglycerin.  I do not see a beta-blocker.  He is now on  Wegovy and is currently asymptomatic.  He will continue medication and cardiac medications.    Other orders -     Semaglutide-Weight Management; Inject 1 mg into the skin once a week for 28 days.  Dispense: 2 mL; Refill: 0    PHYSICAL EXAM:  Blood pressure 134/85, pulse 76, temperature (!) 97.5 F (36.4 C), height 5\' 6"  (1.676 m), weight (!) 455 lb (206.4 kg), SpO2 96%. Body mass index is 73.44 kg/m.  General: He is overweight, cooperative, alert, well developed, and in no acute distress. PSYCH: Has normal mood, affect and thought process.   HEENT: EOMI, sclerae are anicteric. Lungs: Normal breathing effort, no conversational dyspnea. Extremities: No edema.  Neurologic: No gross sensory or motor deficits. No tremors or fasciculations noted.    DIAGNOSTIC DATA REVIEWED:  BMET    Component Value Date/Time   NA 140 10/20/2022 1627   K 4.5 10/20/2022 1627   CL 100 10/20/2022 1627   CO2 21 10/20/2022 1627   GLUCOSE 101 (H) 10/20/2022 1627   GLUCOSE 118 (H) 03/15/2017 1139   BUN 14 10/20/2022 1627   CREATININE 0.87 10/20/2022 1627   CALCIUM 9.4 10/20/2022 1627   GFRNONAA 103 04/08/2020 0747   GFRAA 119 04/08/2020 0747   Lab Results  Component Value Date   HGBA1C 5.7 (H) 03/18/2023   HGBA1C 5.6 05/03/2017   Lab Results  Component Value Date   INSULIN 34.6 (H) 05/07/2022   Lab Results  Component Value Date   TSH 2.090 05/07/2022   CBC    Component Value Date/Time   WBC 7.4 05/07/2022 0911   WBC 8.2 03/16/2017 0132   RBC 4.78 05/07/2022 0911   RBC 4.56 03/16/2017 0132   HGB 14.2 05/07/2022 0911   HCT 42.5 05/07/2022 0911   PLT 252 05/07/2022 0911   MCV 89 05/07/2022 0911   MCH 29.7 05/07/2022 0911   MCH 30.3 03/16/2017 0132   MCHC 33.4 05/07/2022 0911   MCHC 34.1 03/16/2017 0132   RDW 13.0 05/07/2022 0911   Iron Studies No results found for: "IRON", "TIBC", "FERRITIN", "IRONPCTSAT" Lipid Panel     Component Value Date/Time   CHOL 135 05/07/2022 0911    TRIG 108 05/07/2022 0911   HDL 44 05/07/2022 0911   CHOLHDL 3.1 05/07/2022 0911   CHOLHDL 5.2 03/16/2017 0132   VLDL 47 (H) 03/16/2017 0132   LDLCALC 71 05/07/2022 0911   Hepatic Function Panel     Component Value Date/Time   PROT 6.3 10/20/2022 1627   ALBUMIN 4.1 10/20/2022 1627   AST 21 10/20/2022 1627   ALT 26 10/20/2022 1627   ALKPHOS 107 10/20/2022 1627   BILITOT 0.7 10/20/2022 1627   BILIDIR 0.13 12/09/2021 1458      Component Value Date/Time   TSH 2.090 05/07/2022 0911   Nutritional Lab Results  Component Value Date   VD25OH 43.8 03/18/2023   VD25OH 26.4 (L) 10/20/2022   VD25OH 32.4 05/07/2022     Return in about 3 weeks (around 05/20/2023) for For Weight Mangement with Dr. Rikki Spearing.Marland Kitchen He was informed of the importance of frequent follow up visits to maximize his success with intensive lifestyle modifications for his multiple health conditions.   ATTESTASTION STATEMENTS:  Reviewed by clinician on day of visit: allergies, medications, problem list, medical history, surgical history,  family history, social history, and previous encounter notes.     Worthy Rancher, MD

## 2023-04-30 NOTE — Assessment & Plan Note (Signed)
 See obesity treatment plan

## 2023-04-30 NOTE — Assessment & Plan Note (Signed)
Reviewed medication list.  He is currently on antiplatelet therapy moderate intensity statin.  ARB nitroglycerin.  I do not see a beta-blocker.  He is now on Wegovy and is currently asymptomatic.  He will continue medication and cardiac medications.

## 2023-04-30 NOTE — Assessment & Plan Note (Signed)
Improved on PPI.  Continue pantoprazole consider tapering medication off.  Review records for duration of treatment and previous endoscopic evaluation.  No red flag symptoms at present time incretin therapy may worsen acid reflux we discussed nutritional modifications.

## 2023-05-14 NOTE — Telephone Encounter (Signed)
**Note De-Identified Julianny Milstein Obfuscation** Prior Split Night Study PA was canceled per the [facility] Provider Portal.  I started a new PA Catherin Doorn the [facility] Provider portal: Status: PENDED Reason: 1.Disposition pending review Tracking #: [id number]

## 2023-05-24 ENCOUNTER — Encounter (INDEPENDENT_AMBULATORY_CARE_PROVIDER_SITE_OTHER): Payer: Self-pay | Admitting: Internal Medicine

## 2023-05-24 ENCOUNTER — Other Ambulatory Visit: Payer: Self-pay

## 2023-05-24 ENCOUNTER — Ambulatory Visit (INDEPENDENT_AMBULATORY_CARE_PROVIDER_SITE_OTHER): Payer: 59 | Admitting: Internal Medicine

## 2023-05-24 VITALS — BP 129/87 | HR 76 | Temp 97.8°F | Ht 66.0 in | Wt >= 6400 oz

## 2023-05-24 DIAGNOSIS — E669 Obesity, unspecified: Secondary | ICD-10-CM | POA: Diagnosis not present

## 2023-05-24 DIAGNOSIS — I10 Essential (primary) hypertension: Secondary | ICD-10-CM

## 2023-05-24 DIAGNOSIS — G4730 Sleep apnea, unspecified: Secondary | ICD-10-CM

## 2023-05-24 DIAGNOSIS — R29818 Other symptoms and signs involving the nervous system: Secondary | ICD-10-CM

## 2023-05-24 DIAGNOSIS — I5032 Chronic diastolic (congestive) heart failure: Secondary | ICD-10-CM | POA: Diagnosis not present

## 2023-05-24 DIAGNOSIS — R7303 Prediabetes: Secondary | ICD-10-CM

## 2023-05-24 MED ORDER — SEMAGLUTIDE-WEIGHT MANAGEMENT 1 MG/0.5ML ~~LOC~~ SOAJ
1.0000 mg | SUBCUTANEOUS | 0 refills | Status: DC
  Filled 2023-05-24: qty 2, 28d supply, fill #0

## 2023-05-24 MED ORDER — METFORMIN HCL 500 MG PO TABS
500.0000 mg | ORAL_TABLET | Freq: Two times a day (BID) | ORAL | 0 refills | Status: DC
Start: 2023-05-24 — End: 2023-06-23

## 2023-05-24 NOTE — Progress Notes (Addendum)
Office: [phone]  /  Fax: [phone]  Weight Summary And Biometrics  Vitals Temp: 97.8 F (36.6 C) BP: 129/87 Pulse Rate: 76 SpO2: 97 %   Anthropometric Measurements Height: 5\' 6"  (1.676 m) Weight: (!) 441 lb (200 kg) BMI (Calculated): 71.21 Weight at Last Visit: 455 lb Weight Lost Since Last Visit: 14 lb Weight Gained Since Last Visit: 0 lb Starting Weight: 465 lb Total Weight Loss (lbs): 24 lb (10.9 kg) Peak Weight: 480 lb   Body Composition  Body Fat %: 55.5 % Fat Mass (lbs): 224.8 lbs Muscle Mass (lbs): 186.8 lbs Visceral Fat Rating : 54    No data recorded Today's Visit #: 14  Starting Date: 05/07/22   Subjective   Chief Complaint: Obesity  Jacob Hunt is here to discuss his progress with his obesity treatment plan. He is on the the Category 4 Plan and states he is following his eating plan approximately 65 % of the time. He states he is not exercising.  Interval History:   Discussed the use of AI scribe software for clinical note transcription with the patient, who gave verbal consent to proceed.  History of Present Illness   The patient, affected by obesity and a history of coronary artery disease, presents today for weight management. Since the last office visit, the patient reports a weight loss of fourteen pounds, which he attributes to an increased dose of Wegovy to 1mg  once a week and adherence to a reduced calorie nutrition plan about sixty percent of the time. The patient is currently not exercising.  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 He also reports a decrease in leg swelling. The patient is currently working forty hours a week.  On the 1mg  dose of Wegovy, the patient reports a decrease in appetite and portion sizes, rating his hunger at a 3 to 4 on a scale of 1 to 10. He reports not feeling  hungry between meals unless he completely skips a meal. For lunch, he has been taking sandwiches with low carb bread.  The patient has a goal weight of 220-250 pounds, which he maintained in high school until he started his current job. He is scheduled for a sleep study due to a history of sleep apnea. The patient also reports taking a diuretic, furosemide, which he believes has contributed to his weight loss. He reports taking the diuretic as needed, as regular use leads to cramping due to inadequate water intake. He also reports that his legs swell more when sitting and less when walking.       Orexigenic Control:  Denies problems with appetite and hunger signals.  Denies problems with satiety and satiation.  Denies problems with eating patterns and portion control.  Denies abnormal cravings. Denies feeling deprived or restricted.   Barriers identified: none.   Pharmacotherapy for weight loss: He is currently taking Wegovy with adequate clinical response  and without side effects..   Assessment and Plan   Treatment Plan For Obesity:  Recommended Dietary Goals  [name] is currently in the action stage of change. As such, his goal is to continue weight management plan. He has agreed to: continue current plan  Behavioral Intervention  We discussed the following Behavioral Modification Strategies today: continue to work on maintaining a reduced calorie state, getting the recommended amount of protein, incorporating whole foods, making healthy  choices, staying well hydrated and practicing mindfulness when eating..  Additional resources provided today: None  Recommended Physical Activity Goals  [name] has been advised to work up to 150 minutes of moderate intensity aerobic activity a week and strengthening exercises 2-3 times per week for cardiovascular health, weight loss maintenance and preservation of muscle mass.   He has agreed to :  Think about enjoyable ways to increase daily  physical activity and overcoming barriers to exercise and Increase physical activity in their day and reduce sedentary time (increase NEAT).  Pharmacotherapy  We discussed various medication options to help [name] with his weight loss efforts and we both agreed to : continue current anti-obesity medication regimen  Associated Conditions Addressed Today  Assessment and Plan    Obesity   He has lost 14 pounds since the last visit, now weighing 441 pounds. He is following a reduced calorie nutrition plan about 60% of the time and is not currently exercising. He is on 1 mg of Wegovy weekly, which has helped reduce appetite and portion sizes without significant side effects. Despite discussing an increase in Wegovy dose, we decided against it due to the risk of excessive appetite suppression and potential negative impact on nutritional intake. We encouraged him to incorporate physical activity, such as walking and bike riding, to enhance weight loss and improve cardiovascular health. We will continue Wegovy 1 mg weekly, continue metformin, and encourage adherence to the nutrition plan. A follow-up is scheduled in 4 weeks.  Diastolic Heart Failure   He reports leg swelling and is currently on furosemide, which he does not take daily due to cramping and frequent urination. We advised monitoring for sudden weight gain and managing salt intake to reduce fluid retention. We explained that diastolic dysfunction, due to stiffening of the heart muscle, causes fluid backup and leg swelling. We emphasized elevating legs to reduce swelling and the role of salt sensitivity in fluid retention. He will continue furosemide as prescribed, monitor for sudden weight gain, elevate legs, limit salt intake, and follow up with a cardiologist as needed.   Sleep Apnea   He is scheduled for a sleep study but has not yet completed it. He prefers an at-home study, but we advised completing the study at the lab for more accurate  results. We informed him about the potential benefits of CPAP therapy, including improved sleep quality and reduced cardiovascular risk. He is to complete the scheduled sleep study at the lab and consider CPAP therapy based on the study results.  General Health Maintenance   We encouraged maintaining a healthy lifestyle, including regular physical activity and a balanced diet. We discussed the importance of adequate protein intake to support muscle mass during weight loss. He is encouraged to engage in regular physical activity and maintain a balanced diet with adequate protein intake.  Prediabetes On Wegovy for pharmacoprophylaxis.  Continue medication also advised to maintain a diet low in simple and added sugars.  Follow-up   A follow-up is scheduled in 4 weeks.             Objective   Physical Exam:  Blood pressure 129/87, pulse 76, temperature 97.8 F (36.6 C), height 5\' 6"  (1.676 m), weight (!) 441 lb (200 kg), SpO2 97%. Body mass index is 71.18 kg/m.  General: He is overweight, cooperative, alert, well developed, and in no acute distress. PSYCH: Has normal mood, affect and thought process.   HEENT: EOMI, sclerae are anicteric. Lungs: Normal breathing effort, no conversational dyspnea. Extremities:  No edema.  Neurologic: No gross sensory or motor deficits. No tremors or fasciculations noted.    Diagnostic Data Reviewed:  BMET    Component Value Date/Time   NA 140 10/20/2022 1627   K 4.5 10/20/2022 1627   CL 100 10/20/2022 1627   CO2 21 10/20/2022 1627   GLUCOSE 101 (H) 10/20/2022 1627   GLUCOSE 118 (H) 03/15/2017 1139   BUN 14 10/20/2022 1627   CREATININE 0.87 10/20/2022 1627   CALCIUM 9.4 10/20/2022 1627   GFRNONAA 103 04/08/2020 0747   GFRAA 119 04/08/2020 0747   Lab Results  Component Value Date   HGBA1C 5.7 (H) 03/18/2023   HGBA1C 5.6 05/03/2017   Lab Results  Component Value Date   INSULIN 34.6 (H) 05/07/2022   Lab Results  Component Value Date    TSH 2.090 05/07/2022   CBC    Component Value Date/Time   WBC 7.4 05/07/2022 0911   WBC 8.2 03/16/2017 0132   RBC 4.78 05/07/2022 0911   RBC 4.56 03/16/2017 0132   HGB 14.2 05/07/2022 0911   HCT 42.5 05/07/2022 0911   PLT 252 05/07/2022 0911   MCV 89 05/07/2022 0911   MCH 29.7 05/07/2022 0911   MCH 30.3 03/16/2017 0132   MCHC 33.4 05/07/2022 0911   MCHC 34.1 03/16/2017 0132   RDW 13.0 05/07/2022 0911   Iron Studies No results found for: "IRON", "TIBC", "FERRITIN", "IRONPCTSAT" Lipid Panel     Component Value Date/Time   CHOL 135 05/07/2022 0911   TRIG 108 05/07/2022 0911   HDL 44 05/07/2022 0911   CHOLHDL 3.1 05/07/2022 0911   CHOLHDL 5.2 03/16/2017 0132   VLDL 47 (H) 03/16/2017 0132   LDLCALC 71 05/07/2022 0911   Hepatic Function Panel     Component Value Date/Time   PROT 6.3 10/20/2022 1627   ALBUMIN 4.1 10/20/2022 1627   AST 21 10/20/2022 1627   ALT 26 10/20/2022 1627   ALKPHOS 107 10/20/2022 1627   BILITOT 0.7 10/20/2022 1627   BILIDIR 0.13 12/09/2021 1458      Component Value Date/Time   TSH 2.090 05/07/2022 0911   Nutritional Lab Results  Component Value Date   VD25OH 43.8 03/18/2023   VD25OH 26.4 (L) 10/20/2022   VD25OH 32.4 05/07/2022    Follow-Up   No follow-ups on file.Marland Kitchen He was informed of the importance of frequent follow up visits to maximize his success with intensive lifestyle modifications for his multiple health conditions.  Attestation Statement   Reviewed by clinician on day of visit: allergies, medications, problem list, medical history, surgical history, family history, social history, and previous encounter notes.     Worthy Rancher, MD

## 2023-05-28 ENCOUNTER — Other Ambulatory Visit: Payer: Self-pay

## 2023-06-16 ENCOUNTER — Ambulatory Visit: Payer: 59 | Attending: Cardiovascular Disease | Admitting: Cardiovascular Disease

## 2023-06-16 ENCOUNTER — Encounter: Payer: Self-pay | Admitting: Cardiovascular Disease

## 2023-06-16 DIAGNOSIS — E782 Mixed hyperlipidemia: Secondary | ICD-10-CM

## 2023-06-16 DIAGNOSIS — I10 Essential (primary) hypertension: Secondary | ICD-10-CM | POA: Diagnosis not present

## 2023-06-16 DIAGNOSIS — I5032 Chronic diastolic (congestive) heart failure: Secondary | ICD-10-CM

## 2023-06-16 DIAGNOSIS — G4730 Sleep apnea, unspecified: Secondary | ICD-10-CM

## 2023-06-16 DIAGNOSIS — I2510 Atherosclerotic heart disease of native coronary artery without angina pectoris: Secondary | ICD-10-CM

## 2023-06-16 NOTE — Assessment & Plan Note (Signed)
BMI of 72.  I sent him to the [facility] diet wellness center.  He currently is on a GLP 1 agonist and is lost 20 pounds since I last saw him.

## 2023-06-16 NOTE — Assessment & Plan Note (Signed)
Symptoms of obstructive sleep apnea having been evaluated by Dr. Tresa Endo.  Sleep study still pending.

## 2023-06-16 NOTE — Assessment & Plan Note (Signed)
Chronic diastolic heart failure on diuretics (furosemide and metolazone).

## 2023-06-16 NOTE — Patient Instructions (Signed)
Medication Instructions:  Your physician recommends that you continue on your current medications as directed. Please refer to the Current Medication list given to you today.  *If you need a refill on your cardiac medications before your next appointment, please call your pharmacy*   Follow-Up: At [facility], you and your health needs are our priority.  As part of our continuing mission to provide you with exceptional heart care, we have created designated Provider Care Teams.  These Care Teams include your primary Cardiologist (physician) and Advanced Practice Providers (APPs -  Physician Assistants and Nurse Practitioners) who all work together to provide you with the care you need, when you need it.  We recommend signing up for the patient portal called "MyChart".  Sign up information is provided on this After Visit Summary.  MyChart is used to connect with patients for Virtual Visits (Telemedicine).  Patients are able to view lab/test results, encounter notes, upcoming appointments, etc.  Non-urgent messages can be sent to your provider as well.   To learn more about what you can do with MyChart, go to [email or web address].    Your next appointment:   12 month(s)  Provider:   Micah Flesher, PA-C, Robet Leu, PA-C, Azalee Course, PA-C, Bernadene Person, NP, or Reather Littler, NP       Then, Nanetta Batty, MD will plan to see you again in 2 year(s).

## 2023-06-16 NOTE — Progress Notes (Unsigned)
06/16/2023 [facility]   May 23, 1971  [id number]  Primary Physician Irven Coe, MD Primary Cardiologist: Runell Gess MD Jacob Hunt, [place]  HPI:  Jacob Hunt is a 52 y.o.   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  He was referred to be established in my practice.  He previously was seen by Dr. Delton See.  He is accompanied by his wife Jacob Hunt today.  I last saw him in the office 12/09/2021.  He has a history of treated hypertension hyperlipidemia.  He has a strong family history for heart disease with both his parents and his brother all of whom had stents.  He is never had a heart attack or stroke.  He had 2 normal cardiac catheterizations in the past most recently by Dr. Excell Seltzer 03/15/2017.  Since I saw him a year ago his remained stable.  He denies chest pain or shortness of breath.  He was referred to the [facility] diet wellness center for weight loss and was begun on a GLP-1 agonist.  He has lost 20 pounds.  He is being worked up for obstructive sleep apnea by Dr. Tresa Endo.   Current Meds  Medication Sig   aspirin EC 81 MG tablet Take 1 tablet (81 mg total) by mouth daily.   atorvastatin (LIPITOR) 80 MG tablet Take 1 tablet (80 mg total) by mouth daily at 6 PM.   Cholecalciferol 125 MCG (5000 UT) capsule Take 5,000 Units by mouth daily.   furosemide (LASIX) 40 MG tablet Take 1 tablet (40 mg total) by mouth daily.   losartan (COZAAR) 50 MG tablet Take 1 tablet (50 mg total) by mouth daily.   metFORMIN (GLUCOPHAGE) 500 MG tablet Take 1 tablet (500 mg total) by mouth 2 (two) times daily with a meal.   metolazone (ZAROXOLYN) 2.5 MG tablet Take one tablet once a week 30 minutes before you take the Furosemide 40mg    nitroGLYCERIN (NITROSTAT) 0.4 MG SL tablet Place 1 tablet (0.4 mg total) under the tongue every 5 (five) minutes x 3 doses as needed for chest pain.   omega-3 acid ethyl esters (LOVAZA) 1 g capsule  Take 2 capsules (2 g total) by mouth daily.   pantoprazole (PROTONIX) 40 MG tablet Take 1 tablet (40 mg total) by mouth daily.   Psyllium 400 MG CAPS Take 400 mg by mouth daily.   Semaglutide-Weight Management 1 MG/0.5ML SOAJ Inject 1 mg into the skin once a week for 28 days.     No Known Allergies  Social History   Socioeconomic History   Marital status: Married    Spouse name: Jacob Hunt   Number of children: Not on file   Years of education: Not on file   Highest education level: Not on file  Occupational History   Not on file  Tobacco Use   Smoking status: Never   Smokeless tobacco: Never  Vaping Use   Vaping status: Never Used  Substance and Sexual Activity   Alcohol use: No    Alcohol/week: 0.0 standard drinks of alcohol   Drug use: No   Sexual activity: Yes  Other Topics Concern   Not on file  Social History Narrative   Not on file   Social Determinants of Health   Financial Resource Strain: Not on file  Food Insecurity: Not on file  Transportation Needs: Not on file  Physical Activity: Not on file  Stress: Not on file  Social Connections: Not on file  Intimate Partner Violence: Not on file     Review of Systems: General: negative for chills, fever, night sweats or weight changes.  Cardiovascular: negative for chest pain, dyspnea on exertion, edema, orthopnea, palpitations, paroxysmal nocturnal dyspnea or shortness of breath Dermatological: negative for rash Respiratory: negative for cough or wheezing Urologic: negative for hematuria Abdominal: negative for nausea, vomiting, diarrhea, bright red blood per rectum, melena, or hematemesis Neurologic: negative for visual changes, syncope, or dizziness All other systems reviewed and are otherwise negative except as noted above.    Blood pressure 124/70, pulse 73, height 5\' 7"  (1.702 m), weight (!) 459 lb 6.4 oz (208.4 kg), SpO2 94%.  General appearance: alert and no distress Neck: no adenopathy, no carotid  bruit, no JVD, supple, symmetrical, trachea midline, and thyroid not enlarged, symmetric, no tenderness/mass/nodules Lungs: clear to auscultation bilaterally Heart: regular rate and rhythm, S1, S2 normal, no murmur, click, rub or gallop Extremities: extremities normal, atraumatic, no cyanosis or edema Pulses: 2+ and symmetric Skin: Skin color, texture, turgor normal. No rashes or lesions Neurologic: Grossly normal  EKG not performed today      ASSESSMENT AND PLAN:   Morbid obesity (HCC) with starting BMI 75 BMI of 72.  I sent him to the [facility] diet wellness center.  He currently is on a GLP 1 agonist and is lost 20 pounds since I last saw him.  Hyperlipidemia History of hyperlipidemia on high-dose atorvastatin and Lovaza with lipid profile performed 05/07/2022 revealing a total cholesterol 135, LDL 71 and HDL 44.  Essential hypertension History of essential hypertension blood pressure measured today 124/70.  He is on losartan.  Chronic diastolic CHF (congestive heart failure) (HCC) Chronic diastolic heart failure on diuretics (furosemide and metolazone).  CAD (coronary artery disease) History of 2 normal heart cath most recent being 03/2017 when he had nonobstructive disease.  Sleep-disordered breathing Symptoms of obstructive sleep apnea having been evaluated by Dr. Tresa Endo.  Sleep study still pending.     Runell Gess MD FACP,FACC,FAHA, [facility] 06/16/2023 4:15 PM

## 2023-06-16 NOTE — Assessment & Plan Note (Signed)
History of 2 normal heart cath most recent being 03/2017 when he had nonobstructive disease.

## 2023-06-16 NOTE — Assessment & Plan Note (Signed)
History of hyperlipidemia on high-dose atorvastatin and Lovaza with lipid profile performed 05/07/2022 revealing a total cholesterol 135, LDL 71 and HDL 44.

## 2023-06-16 NOTE — Assessment & Plan Note (Signed)
History of essential hypertension blood pressure measured today 124/70.  He is on losartan.

## 2023-06-18 NOTE — Telephone Encounter (Addendum)
**Note De-Identified Baylin Gamblin Obfuscation** I attempted another Split Night Sleep study PA through the [facility] provider portal and it was cancelled as well.  I called [employer] and s/w Deidre Ala. Per Oswaldo Done, the reason the pts Split Night Sleep study PAs have been cancelled is because the pts new policy will not begin until 07/07/2023 and that since the pts Split Night Sleep Study will not be scheduled until after the New Year due to the sleep labs schedule being filled for the remainder of this year, we will have to wait until 07/07/2023 to do a PA.

## 2023-06-23 ENCOUNTER — Other Ambulatory Visit: Payer: Self-pay

## 2023-06-23 ENCOUNTER — Other Ambulatory Visit (INDEPENDENT_AMBULATORY_CARE_PROVIDER_SITE_OTHER): Payer: Self-pay | Admitting: Internal Medicine

## 2023-06-23 ENCOUNTER — Ambulatory Visit (INDEPENDENT_AMBULATORY_CARE_PROVIDER_SITE_OTHER): Payer: 59 | Admitting: Internal Medicine

## 2023-06-23 ENCOUNTER — Encounter (INDEPENDENT_AMBULATORY_CARE_PROVIDER_SITE_OTHER): Payer: Self-pay | Admitting: Internal Medicine

## 2023-06-23 VITALS — BP 122/81 | HR 74 | Temp 97.9°F | Ht 66.0 in | Wt >= 6400 oz

## 2023-06-23 DIAGNOSIS — I5032 Chronic diastolic (congestive) heart failure: Secondary | ICD-10-CM | POA: Diagnosis not present

## 2023-06-23 DIAGNOSIS — K219 Gastro-esophageal reflux disease without esophagitis: Secondary | ICD-10-CM | POA: Diagnosis not present

## 2023-06-23 DIAGNOSIS — R7303 Prediabetes: Secondary | ICD-10-CM

## 2023-06-23 DIAGNOSIS — Z6841 Body Mass Index (BMI) 40.0 and over, adult: Secondary | ICD-10-CM

## 2023-06-23 MED ORDER — SEMAGLUTIDE-WEIGHT MANAGEMENT 1.7 MG/0.75ML ~~LOC~~ SOAJ
1.7000 mg | SUBCUTANEOUS | 0 refills | Status: AC
Start: 2023-06-23 — End: 2023-07-21

## 2023-06-23 MED ORDER — PANTOPRAZOLE SODIUM 40 MG PO TBEC: 40.0000 mg | DELAYED_RELEASE_TABLET | Freq: Every day | ORAL | 0 refills | Status: DC

## 2023-06-23 MED ORDER — METFORMIN HCL 500 MG PO TABS
500.0000 mg | ORAL_TABLET | Freq: Two times a day (BID) | ORAL | 0 refills | Status: DC
  Filled 2023-06-23: qty 60, 30d supply, fill #0

## 2023-06-23 NOTE — Assessment & Plan Note (Signed)
Most recent A1c is  Lab Results  Component Value Date   HGBA1C 5.7 (H) 03/18/2023   HGBA1C 5.6 05/03/2017    Patient aware of disease state and risk of progression. This may contribute to abnormal cravings, fatigue and diabetic complications without having diabetes.   We have discussed treatment options which include: losing 7 to 10% of body weight, increasing physical activity to a goal of 150 minutes a week at moderate intensity.  Advised to maintain a diet low on simple and processed carbohydrates.  Increase Wegovy to 1.7 mg once a week for pharmacoprophylaxis

## 2023-06-23 NOTE — Assessment & Plan Note (Signed)
Patient has gained 9 pounds I suspect due to diastolic heart failure.  He has not been following his diuretic regimen due to undesirable side effects.  He will try to take the furosemide considering 6-hour window for diuresis.  He is experiencing leg cramping when he uses medication regularly which may be due to inadequate fluid intake as well as electrolyte shifts.  I advised him on starting a magnesium supplement as his potassium has been checked during these episodes.  1 could consider a trial of either torsemide or bumetanide to see if cramping improves with a different agent.  He is not taking metolazone.  Patient advised on self monitoring he will schedule an appointment with his PCP if his weight does not go down or if he gains 3 pounds in a day or more than 5 pounds in the next 5 days.

## 2023-06-23 NOTE — Assessment & Plan Note (Signed)
Patient has suddenly increased 9 pounds which I suspect is due to his heart failure and fluid retention.  He acknowledges some dietary indiscretion around sodium.  He is also not been taking his furosemide because of urinary frequency and daytime commitments.  Patient will continue to work on on reimplementation of plan eating more nutritious meals.  We are increasing his Wegovy to 1.7 mg once a week.

## 2023-06-23 NOTE — Telephone Encounter (Signed)
Call to pharmacy, spoke to [facility].  Patient received lower doses of Wegovy from [facility].

## 2023-06-23 NOTE — Assessment & Plan Note (Signed)
Stable on pantoprazole.  No alarming symptoms.  Medication will be refilled.

## 2023-06-23 NOTE — Progress Notes (Unsigned)
Office: [phone]  /  Fax: [phone]  Weight Summary And Biometrics  Vitals Temp: 97.9 F (36.6 C) BP: 122/81 Pulse Rate: 74 SpO2: 97 %   Anthropometric Measurements Height: 5\' 6"  (1.676 m) Weight: (!) 450 lb (204.1 kg) BMI (Calculated): 72.67 Weight at Last Visit: 441 lb Weight Lost Since Last Visit: 0 lb Weight Gained Since Last Visit: 9 lb Starting Weight: 465 lb Total Weight Loss (lbs): 15 lb (6.804 kg) Peak Weight: 480 lb   Body Composition  Body Fat %: 58 % Fat Mass (lbs): 261.2 lbs Muscle Mass (lbs): 179.8 lbs    No data recorded Today's Visit #: 15  Starting Date: 05/07/22   Subjective   Chief Complaint: Obesity  Jacob Hunt is here to discuss his progress with his obesity treatment plan. He is on the the Category 4 Plan and states he is following his eating plan approximately 50 % of the time. He states he is exercising 10 minutes 2-3 times per week.  Interval History:   Discussed the use of AI scribe software for clinical note transcription with the patient, who gave verbal consent to proceed.  History of Present Illness   The patient, with a history of heart failure, presents for a medical weight management consultation. He reports a recent weight gain of nine pounds, which he attributes to poor dietary habits and fluid retention. He admits to not taking his prescribed diuretic, Lasix, regularly due to personal commitments and the inconvenience of frequent urination. He also reports skipping meals and consuming unhealthy food options, such as a barbecue sandwich.  The patient describes a sensation of heaviness in his legs, which he believes is due to fluid retention. He also reports a history of leg swelling, which has previously resulted in weeping ulcers. He has not been taking his prescribed metolazone, a diuretic, due to concerns about its potent diuretic effect.  In terms of his weight management medication, Wegovy, he is currently on a 1mg   dose and reports feeling full quickly during meals and not feeling hungry throughout the day. He has experienced some constipation and nausea, which he manages by eating. He is open to increasing the dose to 1.7mg .  The patient also mentions a history of acid reflux, which he manages with pantoprazole. He reports that certain foods, such as bananas, can trigger his reflux. He is also on metformin for an unspecified condition. He has been trying to schedule a sleep study, but is awaiting insurance approval.       Orexigenic Control:  Denies problems with appetite and hunger signals.  Denies problems with satiety and satiation.  Denies problems with eating patterns and portion control.  Denies abnormal cravings. Denies feeling deprived or restricted.   Barriers identified: low volume of physical activity at present  and medical comorbidities.   Pharmacotherapy for weight loss: He is currently taking Wegovy with adequate clinical response  and without side effects..   Assessment and Plan   Treatment Plan For Obesity:  Recommended Dietary Goals  Jacob Hunt is currently in the action stage of change. As such, his goal is to continue weight management plan. He has agreed to: continue current plan  Behavioral Intervention  We discussed the following Behavioral Modification Strategies today: continue to work on maintaining a reduced calorie state, getting the recommended amount of protein, incorporating whole foods, making healthy choices, staying well hydrated and practicing mindfulness when eating..  Additional resources provided today: None  Recommended Physical Activity Goals  Jacob Hunt has been advised  to work up to 150 minutes of moderate intensity aerobic activity a week and strengthening exercises 2-3 times per week for cardiovascular health, weight loss maintenance and preservation of muscle mass.   He has agreed to :  Think about enjoyable ways to increase daily physical activity and  overcoming barriers to exercise and Increase physical activity in their day and reduce sedentary time (increase NEAT).  Pharmacotherapy  We discussed various medication options to help Jacob Hunt with his weight loss efforts and we both agreed to : increase Wegovy to 1.7 mg once a week  Associated Conditions Addressed Today  Chronic diastolic CHF (congestive heart failure) (HCC) Assessment & Plan: Patient has gained 9 pounds I suspect due to diastolic heart failure.  He has not been following his diuretic regimen due to undesirable side effects.  He will try to take the furosemide considering 6-hour window for diuresis.  He is experiencing leg cramping when he uses medication regularly which may be due to inadequate fluid intake as well as electrolyte shifts.  I advised him on starting a magnesium supplement as his potassium has been checked during these episodes.  1 could consider a trial of either torsemide or bumetanide to see if cramping improves with a different agent.  He is not taking metolazone.  Patient advised on self monitoring he will schedule an appointment with his PCP if his weight does not go down or if he gains 3 pounds in a day or more than 5 pounds in the next 5 days.   Pre-diabetes Assessment & Plan: Most recent A1c is  Lab Results  Component Value Date   HGBA1C 5.7 (H) 03/18/2023   HGBA1C 5.6 05/03/2017    Patient aware of disease state and risk of progression. This may contribute to abnormal cravings, fatigue and diabetic complications without having diabetes.   We have discussed treatment options which include: losing 7 to 10% of body weight, increasing physical activity to a goal of 150 minutes a week at moderate intensity.  Advised to maintain a diet low on simple and processed carbohydrates.  Increase Wegovy to 1.7 mg once a week for pharmacoprophylaxis   Orders: -     metFORMIN HCl; Take 1 tablet (500 mg total) by mouth 2 (two) times daily with a meal.   Dispense: 60 tablet; Refill: 0  Gastroesophageal reflux disease without esophagitis Assessment & Plan: Stable on pantoprazole.  No alarming symptoms.  Medication will be refilled.  Orders: -     Pantoprazole Sodium; Take 1 tablet (40 mg total) by mouth daily.  Dispense: 90 tablet; Refill: 0  Morbid obesity (HCC) Assessment & Plan: Patient has suddenly increased 9 pounds which I suspect is due to his heart failure and fluid retention.  He acknowledges some dietary indiscretion around sodium.  He is also not been taking his furosemide because of urinary frequency and daytime commitments.  Patient will continue to work on on reimplementation of plan eating more nutritious meals.  We are increasing his Wegovy to 1.7 mg once a week.  Orders: -     Semaglutide-Weight Management; Inject 1.7 mg into the skin once a week for 28 days.  Dispense: 3 mL; Refill: 0         Objective   Physical Exam:  Blood pressure 122/81, pulse 74, temperature 97.9 F (36.6 C), height 5\' 6"  (1.676 m), weight (!) 450 lb (204.1 kg), SpO2 97%. Body mass index is 72.63 kg/m.  General: He is overweight, cooperative, alert, well developed, and in  no acute distress. PSYCH: Has normal mood, affect and thought process.   HEENT: EOMI, sclerae are anicteric. Lungs: Normal breathing effort, no conversational dyspnea. Extremities: No edema.  Neurologic: No gross sensory or motor deficits. No tremors or fasciculations noted.    Diagnostic Data Reviewed:  BMET    Component Value Date/Time   NA 140 10/20/2022 1627   K 4.5 10/20/2022 1627   CL 100 10/20/2022 1627   CO2 21 10/20/2022 1627   GLUCOSE 101 (H) 10/20/2022 1627   GLUCOSE 118 (H) 03/15/2017 1139   BUN 14 10/20/2022 1627   CREATININE 0.87 10/20/2022 1627   CALCIUM 9.4 10/20/2022 1627   GFRNONAA 103 04/08/2020 0747   GFRAA 119 04/08/2020 0747   Lab Results  Component Value Date   HGBA1C 5.7 (H) 03/18/2023   HGBA1C 5.6 05/03/2017   Lab Results   Component Value Date   INSULIN 34.6 (H) 05/07/2022   Lab Results  Component Value Date   TSH 2.090 05/07/2022   CBC    Component Value Date/Time   WBC 7.4 05/07/2022 0911   WBC 8.2 03/16/2017 0132   RBC 4.78 05/07/2022 0911   RBC 4.56 03/16/2017 0132   HGB 14.2 05/07/2022 0911   HCT 42.5 05/07/2022 0911   PLT 252 05/07/2022 0911   MCV 89 05/07/2022 0911   MCH 29.7 05/07/2022 0911   MCH 30.3 03/16/2017 0132   MCHC 33.4 05/07/2022 0911   MCHC 34.1 03/16/2017 0132   RDW 13.0 05/07/2022 0911   Iron Studies No results found for: "IRON", "TIBC", "FERRITIN", "IRONPCTSAT" Lipid Panel     Component Value Date/Time   CHOL 135 05/07/2022 0911   TRIG 108 05/07/2022 0911   HDL 44 05/07/2022 0911   CHOLHDL 3.1 05/07/2022 0911   CHOLHDL 5.2 03/16/2017 0132   VLDL 47 (H) 03/16/2017 0132   LDLCALC 71 05/07/2022 0911   Hepatic Function Panel     Component Value Date/Time   PROT 6.3 10/20/2022 1627   ALBUMIN 4.1 10/20/2022 1627   AST 21 10/20/2022 1627   ALT 26 10/20/2022 1627   ALKPHOS 107 10/20/2022 1627   BILITOT 0.7 10/20/2022 1627   BILIDIR 0.13 12/09/2021 1458      Component Value Date/Time   TSH 2.090 05/07/2022 0911   Nutritional Lab Results  Component Value Date   VD25OH 43.8 03/18/2023   VD25OH 26.4 (L) 10/20/2022   VD25OH 32.4 05/07/2022    Follow-Up   No follow-ups on file.Marland Kitchen He was informed of the importance of frequent follow up visits to maximize his success with intensive lifestyle modifications for his multiple health conditions.  Attestation Statement   Reviewed by clinician on day of visit: allergies, medications, problem list, medical history, surgical history, family history, social history, and previous encounter notes.     Worthy Rancher, MD

## 2023-06-25 ENCOUNTER — Other Ambulatory Visit: Payer: Self-pay

## 2023-07-09 NOTE — Telephone Encounter (Signed)
**Note De-Identified Loxley Cibrian Obfuscation** Per the [facility] Provider Portal: CPT Code: 08657 (Split Night)  Status: PENDED Tracking #: [id number]

## 2023-07-13 NOTE — Telephone Encounter (Signed)
**Note De-Identified Marquisha Nikolov Obfuscation** I called [employer] and was advised by Veatrice Bourbon that this Split Night Sleep study PA has been approved. Valid dates: 07/09/2023-11/18/2023. Authorization #: [id number] Call Reference #: [id number]

## 2023-07-29 ENCOUNTER — Other Ambulatory Visit: Payer: Self-pay

## 2023-07-29 ENCOUNTER — Encounter (INDEPENDENT_AMBULATORY_CARE_PROVIDER_SITE_OTHER): Payer: Self-pay | Admitting: Internal Medicine

## 2023-07-29 ENCOUNTER — Ambulatory Visit (INDEPENDENT_AMBULATORY_CARE_PROVIDER_SITE_OTHER): Payer: 59 | Admitting: Internal Medicine

## 2023-07-29 VITALS — BP 124/83 | HR 77 | Temp 97.6°F | Ht 66.0 in | Wt >= 6400 oz

## 2023-07-29 DIAGNOSIS — R7303 Prediabetes: Secondary | ICD-10-CM

## 2023-07-29 DIAGNOSIS — R29818 Other symptoms and signs involving the nervous system: Secondary | ICD-10-CM

## 2023-07-29 DIAGNOSIS — Z6841 Body Mass Index (BMI) 40.0 and over, adult: Secondary | ICD-10-CM

## 2023-07-29 DIAGNOSIS — I5032 Chronic diastolic (congestive) heart failure: Secondary | ICD-10-CM

## 2023-07-29 MED ORDER — SEMAGLUTIDE-WEIGHT MANAGEMENT 2.4 MG/0.75ML ~~LOC~~ SOAJ
2.4000 mg | SUBCUTANEOUS | 0 refills | Status: DC
  Filled 2023-07-29: qty 3, 28d supply, fill #0

## 2023-07-29 MED ORDER — METFORMIN HCL 500 MG PO TABS: 500.0000 mg | ORAL_TABLET | Freq: Two times a day (BID) | ORAL | 0 refills | Status: DC

## 2023-07-29 NOTE — Assessment & Plan Note (Signed)
Patient clearly symptomatic and has a high risk phenotype.  Has not yet undergone a sleep study due to insurance issues. Discussed importance of diagnosing and treating sleep apnea, including its impact on weight loss, cardiovascular health, and overall well-being. Emphasized need to schedule sleep study to confirm diagnosis and potentially qualify for additional weight loss medications. Explained that untreated sleep apnea increases risk of stroke, heart attack, heart rhythm problems, and death during sleep. - Schedule sleep study - Follow up on insurance coverage for sleep study

## 2023-07-29 NOTE — Progress Notes (Signed)
Office: [phone]  /  Fax: [phone]  Weight Summary And Biometrics  Vitals Temp: 97.6 F (36.4 C) BP: 124/83 Pulse Rate: 77 SpO2: 98 %   Anthropometric Measurements Height: 5\' 6"  (1.676 m) Weight: (!) 445 lb (201.9 kg) BMI (Calculated): 71.86 Weight at Last Visit: 450 lb Weight Lost Since Last Visit: 5 lb Weight Gained Since Last Visit: 0 lb Starting Weight: 465 lb Total Weight Loss (lbs): 20 lb (9.072 kg) Peak Weight: 480 lb   Body Composition  Body Fat %: 55.1 % Fat Mass (lbs): 245.2 lbs Muscle Mass (lbs): 190 lbs Visceral Fat Rating : 55    No data recorded No data recorded No data recorded  Subjective   Chief Complaint: Obesity  Tkai is here to discuss his progress with his obesity treatment plan. He is on the the Category 4 Plan and states he is following his eating plan approximately 60 % of the time. He states he is not exercising.  Weight Progress Since Last Visit:  Discussed the use of AI scribe software for clinical note transcription with the patient, who gave verbal consent to proceed.  History of Present Illness   The patient, known to have a history of coronary artery disease, heart failure, and obesity, presents with a significant improvement in lower extremity swelling. He reports that the swelling has reduced considerably, with the skin feeling looser and less painful compared to previous instances. The patient attributes this improvement to increased compliance with diuretic medication and increased physical activity at work.  The patient is currently on a 1.7mg  dose of Wegovy for weight management, which he has been taking for the past four weeks. He reports feeling well on this medication, with noticeable appetite suppression. He has experienced instances of feeling bloated and uncomfortable after eating large meals, which he now avoids. No issues with constipation have been reported.  The patient has noticed a significant reduction  in body weight, with visible changes in body shape, particularly around the abdominal area. He reports increased physical activity, including walking, which was previously limited due to knee pain. This knee pain has since resolved, which the patient attributes to the weight loss.  The patient has a pending sleep study due to suspected sleep apnea. He reports delays in scheduling the study due to insurance issues. The patient acknowledges the potential impact of sleep apnea on his overall health and is keen to get the study done.  The patient has a family history of gastric bypass surgery, with mixed outcomes reported among family members. He expresses a cautious interest in this as a potential future intervention but is currently focused on lifestyle changes and medication management for weight loss. He is open to increasing the dose of Wegovy to the maximum of 2.4mg  and continuing with metformin. He has also expressed a willingness to try taking metformin twice daily on non-working days to assess tolerance.       Challenges affecting patient progress: work schedule, having difficulty with meal prep and planning, low volume of physical activity at present , and medical comorbidities.   Orexigenic Control: Reports improved problems with appetite and hunger signals.  Reports improved problems with satiety and satiation.  Reports improved problems with eating patterns and portion control.  Denies abnormal cravings. Denies feeling deprived or restricted.   Pharmacotherapy for weight management: He is currently taking Wegovy with adequate clinical response  and without side effects..   Assessment and Plan   Treatment Plan For Obesity:  Recommended Dietary  Goals  Jousha is currently in the action stage of change. As such, his goal is to continue weight management plan. He has agreed to: continue current plan  Behavioral Health and Counseling  We discussed the following behavioral  modification strategies today: continue to work on maintaining a reduced calorie state, getting the recommended amount of protein, incorporating whole foods, making healthy choices, staying well hydrated and practicing mindfulness when eating..  Additional education and resources provided today: None  Recommended Physical Activity Goals  Adiv has been advised to work up to 150 minutes of moderate intensity aerobic activity a week and strengthening exercises 2-3 times per week for cardiovascular health, weight loss maintenance and preservation of muscle mass.   He has agreed to :  Think about enjoyable ways to increase daily physical activity and overcoming barriers to exercise and Increase physical activity in their day and reduce sedentary time (increase NEAT).  Pharmacotherapy  We discussed various medication options to help [facility] with his weight loss efforts and we both agreed to : increase Wegovy to 2.4 mg once a week.  Continue metformin for synergy.  Associated Conditions Impacted by Obesity Treatment  Chronic diastolic CHF (congestive heart failure) (HCC) Assessment & Plan: Improved.  His adherence with furosemide is better.  Continue to monitor weight and maintain a diet low in sodium.   Pre-diabetes Assessment & Plan: Most recent A1c is  Lab Results  Component Value Date   HGBA1C 5.7 (H) 03/18/2023   HGBA1C 5.6 05/03/2017    Patient aware of disease state and risk of progression. This may contribute to abnormal cravings, fatigue and diabetic complications without having diabetes.   We have discussed treatment options which include: losing 7 to 10% of body weight, increasing physical activity to a goal of 150 minutes a week at moderate intensity.  Advised to maintain a diet low on simple and processed carbohydrates.  Increase Wegovy to 2.4 mg once a week for pharmacoprophylaxis.  Continue metformin for synergy   Orders: -     metFORMIN HCl; Take 1 tablet (500 mg  total) by mouth 2 (two) times daily with a meal.  Dispense: 60 tablet; Refill: 0  Morbid obesity (HCC) Assessment & Plan: Currently on 1.7 mg of Wegovy for four weeks with good appetite suppression and significant weight loss. Current weight is 445 lbs. Discussed increasing Wegovy to 2.4 mg, the maximum dose, and continuing metformin to enhance the effects of Wegovy. Discussed potential weight loss plateau and future interventions, including gastric bypass surgery, which can result in 20-40% weight loss but requires continued lifestyle changes to prevent weight regain.  Emphasized importance of continued lifestyle changes, including nutrition and exercise, and potential benefits of newer medications. Patient expressed understanding and willingness to continue current plan and consider future options if necessary. - Increase Wegovy to 2.4 mg - Continue metformin - Encourage continued lifestyle changes including nutrition and exercise - Discuss potential future interventions including gastric bypass surgery if necessary  Orders: -     Semaglutide-Weight Management; Inject 2.4 mg into the skin once a week for 28 days.  Dispense: 3 mL; Refill: 0  Suspected sleep apnea Assessment & Plan: Patient clearly symptomatic and has a high risk phenotype.  Has not yet undergone a sleep study due to insurance issues. Discussed importance of diagnosing and treating sleep apnea, including its impact on weight loss, cardiovascular health, and overall well-being. Emphasized need to schedule sleep study to confirm diagnosis and potentially qualify for additional weight loss medications. Explained  that untreated sleep apnea increases risk of stroke, heart attack, heart rhythm problems, and death during sleep. - Schedule sleep study - Follow up on insurance coverage for sleep study       Objective   Physical Exam:  Blood pressure 124/83, pulse 77, temperature 97.6 F (36.4 C), height 5\' 6"  (1.676 m), weight  (!) 445 lb (201.9 kg), SpO2 98%. Body mass index is 71.82 kg/m.  General: He is overweight, cooperative, alert, well developed, and in no acute distress. PSYCH: Has normal mood, affect and thought process.   HEENT: EOMI, sclerae are anicteric. Lungs: Normal breathing effort, no conversational dyspnea. Extremities: No edema.  Neurologic: No gross sensory or motor deficits. No tremors or fasciculations noted.    Diagnostic Data Reviewed:  BMET    Component Value Date/Time   NA 140 10/20/2022 1627   K 4.5 10/20/2022 1627   CL 100 10/20/2022 1627   CO2 21 10/20/2022 1627   GLUCOSE 101 (H) 10/20/2022 1627   GLUCOSE 118 (H) 03/15/2017 1139   BUN 14 10/20/2022 1627   CREATININE 0.87 10/20/2022 1627   CALCIUM 9.4 10/20/2022 1627   GFRNONAA 103 04/08/2020 0747   GFRAA 119 04/08/2020 0747   Lab Results  Component Value Date   HGBA1C 5.7 (H) 03/18/2023   HGBA1C 5.6 05/03/2017   Lab Results  Component Value Date   INSULIN 34.6 (H) 05/07/2022   Lab Results  Component Value Date   TSH 2.090 05/07/2022   CBC    Component Value Date/Time   WBC 7.4 05/07/2022 0911   WBC 8.2 03/16/2017 0132   RBC 4.78 05/07/2022 0911   RBC 4.56 03/16/2017 0132   HGB 14.2 05/07/2022 0911   HCT 42.5 05/07/2022 0911   PLT 252 05/07/2022 0911   MCV 89 05/07/2022 0911   MCH 29.7 05/07/2022 0911   MCH 30.3 03/16/2017 0132   MCHC 33.4 05/07/2022 0911   MCHC 34.1 03/16/2017 0132   RDW 13.0 05/07/2022 0911   Iron Studies No results found for: "IRON", "TIBC", "FERRITIN", "IRONPCTSAT" Lipid Panel     Component Value Date/Time   CHOL 135 05/07/2022 0911   TRIG 108 05/07/2022 0911   HDL 44 05/07/2022 0911   CHOLHDL 3.1 05/07/2022 0911   CHOLHDL 5.2 03/16/2017 0132   VLDL 47 (H) 03/16/2017 0132   LDLCALC 71 05/07/2022 0911   Hepatic Function Panel     Component Value Date/Time   PROT 6.3 10/20/2022 1627   ALBUMIN 4.1 10/20/2022 1627   AST 21 10/20/2022 1627   ALT 26 10/20/2022 1627    ALKPHOS 107 10/20/2022 1627   BILITOT 0.7 10/20/2022 1627   BILIDIR 0.13 12/09/2021 1458      Component Value Date/Time   TSH 2.090 05/07/2022 0911   Nutritional Lab Results  Component Value Date   VD25OH 43.8 03/18/2023   VD25OH 26.4 (L) 10/20/2022   VD25OH 32.4 05/07/2022    Follow-Up   Return in about 4 weeks (around 08/26/2023) for For Weight Mangement with Dr. Rikki Spearing.Marland Kitchen He was informed of the importance of frequent follow up visits to maximize his success with intensive lifestyle modifications for his multiple health conditions.  Attestation Statement   Reviewed by clinician on day of visit: allergies, medications, problem list, medical history, surgical history, family history, social history, and previous encounter notes.     Worthy Rancher, MD

## 2023-07-29 NOTE — Assessment & Plan Note (Signed)
Improved.  His adherence with furosemide is better.  Continue to monitor weight and maintain a diet low in sodium.

## 2023-07-29 NOTE — Assessment & Plan Note (Signed)
Currently on 1.7 mg of Wegovy for four weeks with good appetite suppression and significant weight loss. Current weight is 445 lbs. Discussed increasing Wegovy to 2.4 mg, the maximum dose, and continuing metformin to enhance the effects of Wegovy. Discussed potential weight loss plateau and future interventions, including gastric bypass surgery, which can result in 20-40% weight loss but requires continued lifestyle changes to prevent weight regain.  Emphasized importance of continued lifestyle changes, including nutrition and exercise, and potential benefits of newer medications. Patient expressed understanding and willingness to continue current plan and consider future options if necessary. - Increase Wegovy to 2.4 mg - Continue metformin - Encourage continued lifestyle changes including nutrition and exercise - Discuss potential future interventions including gastric bypass surgery if necessary

## 2023-07-29 NOTE — Assessment & Plan Note (Signed)
Most recent A1c is  Lab Results  Component Value Date   HGBA1C 5.7 (H) 03/18/2023   HGBA1C 5.6 05/03/2017    Patient aware of disease state and risk of progression. This may contribute to abnormal cravings, fatigue and diabetic complications without having diabetes.   We have discussed treatment options which include: losing 7 to 10% of body weight, increasing physical activity to a goal of 150 minutes a week at moderate intensity.  Advised to maintain a diet low on simple and processed carbohydrates.  Increase Wegovy to 2.4 mg once a week for pharmacoprophylaxis.  Continue metformin for synergy

## 2023-08-24 ENCOUNTER — Ambulatory Visit (INDEPENDENT_AMBULATORY_CARE_PROVIDER_SITE_OTHER): Payer: 59 | Admitting: Internal Medicine

## 2023-08-24 ENCOUNTER — Encounter (INDEPENDENT_AMBULATORY_CARE_PROVIDER_SITE_OTHER): Payer: Self-pay | Admitting: Internal Medicine

## 2023-08-24 ENCOUNTER — Other Ambulatory Visit: Payer: Self-pay

## 2023-08-24 VITALS — BP 131/83 | HR 75 | Temp 97.7°F | Ht 66.0 in | Wt >= 6400 oz

## 2023-08-24 DIAGNOSIS — R29818 Other symptoms and signs involving the nervous system: Secondary | ICD-10-CM | POA: Diagnosis not present

## 2023-08-24 DIAGNOSIS — Z6841 Body Mass Index (BMI) 40.0 and over, adult: Secondary | ICD-10-CM

## 2023-08-24 DIAGNOSIS — R7303 Prediabetes: Secondary | ICD-10-CM | POA: Diagnosis not present

## 2023-08-24 MED ORDER — METFORMIN HCL 500 MG PO TABS
500.0000 mg | ORAL_TABLET | Freq: Two times a day (BID) | ORAL | 0 refills | Status: DC
Start: 2023-08-24 — End: 2023-09-23

## 2023-08-24 MED ORDER — SEMAGLUTIDE-WEIGHT MANAGEMENT 2.4 MG/0.75ML ~~LOC~~ SOAJ
2.4000 mg | SUBCUTANEOUS | 1 refills | Status: DC
  Filled 2023-08-24: qty 3, 28d supply, fill #0

## 2023-08-24 NOTE — Progress Notes (Signed)
 Office: [phone]  /  Fax: [phone]  Weight Summary And Biometrics  Vitals Temp: 97.7 F (36.5 C) BP: 131/83 Pulse Rate: 75 SpO2: 99 %   Anthropometric Measurements Height: 5\' 6"  (1.676 m) Weight: (!) 443 lb (200.9 kg) BMI (Calculated): 71.54 Weight at Last Visit: 445 lb Weight Lost Since Last Visit: 2 lb Weight Gained Since Last Visit: 0 lb Starting Weight: 465 lb Total Weight Loss (lbs): 22 lb (9.979 kg) Peak Weight: 480 lb   Body Composition  Body Fat %: 56 % Fat Mass (lbs): 248 lbs Muscle Mass (lbs): 185.6 lbs Visceral Fat Rating : 55    No data recorded Today's Visit #: 17  Starting Date: 05/17/22   Subjective   Chief Complaint: Obesity  Jacob Hunt is here to discuss his progress with his obesity treatment plan. He is on the the Category 4 Plan and states he is following his eating plan approximately 60 % of the time. He states he is not exercising.  Weight Progress Since Last Visit:  Discussed the use of AI scribe software for clinical note transcription with the patient, who gave verbal consent to proceed.  History of Present Illness   Jacob Hunt "Jacob Hunt or Jacob Hunt" is a 53 year old male who presents for medical weight management.  He is undergoing medical weight management and has lost two pounds since his last visit. He is currently on Wegovy 2.4 mg, having started this dose in the past month, and has completed four injections. He notes improvement in fluid retention and swelling in his legs, with a reduction in blister formation.  Since Sunday, he experiences bloating and stomach discomfort, with mild nausea but no vomiting. He has constipation, describing his stool as 'kind of like peanut butter, but pretty heavy,' and notes variability in stool consistency, sometimes experiencing loose stools. He attributes some of these symptoms to dietary choices, including a recent meal of greasy foods during a Valentine's outing.  He acknowledges  skipping meals, particularly breakfast, due to a busy work schedule. He sometimes uses protein shakes as a meal replacement. Breakfast is often skipped because he is not a morning person and finds it difficult to eat early.  He has a history of significant weight loss, having lost over 40 pounds from a previous weight of 480 pounds. He reports improved physical ability and fitting into clothes that were previously too tight. He mentions a past experience with [employer], where he learned the importance of regular meals to maintain metabolism.  He has not yet completed a sleep study, which was previously recommended. He acknowledges that untreated sleep apnea could be affecting his metabolic rate.      Challenges affecting patient progress: low volume of physical activity at present , orthopedic problems, medical conditions or chronic pain affecting mobility, medical comorbidities, and suspected sleep apnea chronic skipping of meals .   Orexigenic Control: Denies problems with appetite and hunger signals.  Denies problems with satiety and satiation.  Denies problems with eating patterns and portion control.  Denies abnormal cravings. Denies feeling deprived or restricted.   Pharmacotherapy for weight management: He is currently taking Wegovy with adequate clinical response  and without side effects..   Assessment and Plan   Treatment Plan For Obesity:  Recommended Dietary Goals  Jacob Hunt is currently in the action stage of change. As such, his goal is to continue weight management plan. He has agreed to: continue current plan  Behavioral Health and Counseling  We discussed the following  behavioral modification strategies today: continue to work on maintaining a reduced calorie state, getting the recommended amount of protein, incorporating whole foods, making healthy choices, staying well hydrated and practicing mindfulness when eating..  Additional education and resources provided  today: None  Recommended Physical Activity Goals  Jacob Hunt has been advised to work up to 150 minutes of moderate intensity aerobic activity a week and strengthening exercises 2-3 times per week for cardiovascular health, weight loss maintenance and preservation of muscle mass.   He has agreed to :  Think about enjoyable ways to increase daily physical activity and overcoming barriers to exercise and Increase physical activity in their day and reduce sedentary time (increase NEAT).  Pharmacotherapy  We discussed various medication options to help Jacob Hunt with his weight loss efforts and we both agreed to : adequate clinical response to current dose, continue current regimen  Associated Conditions Impacted by Obesity Treatment  Pre-diabetes -     metFORMIN HCl; Take 1 tablet (500 mg total) by mouth 2 (two) times daily with a meal.  Dispense: 180 tablet; Refill: 0  Morbid obesity (HCC) -     Semaglutide-Weight Management; Inject 2.4 mg into the skin once a week.  Dispense: 3 mL; Refill: 1    Assessment and Plan    Obesity Patient presents for medical weight management. He has lost two pounds since the last visit. Currently on Wegovy 2.4 mg, with the first month at this dose. Reports some bloating and mild nausea since Sunday, likely related to dietary intake. No significant changes in appetite or sense of fullness. Emphasized the importance of regular meals, adequate protein intake, and physical activity to support metabolic rate and optimize weight loss. Discussed potential need for gastric bypass if weight loss goals are not met, noting improved outcomes with pre-surgery weight loss. - Continue Wegovy 2.4 mg - Continue Metformin - Encourage three meals a day with adequate protein intake - Recommend high-protein breakfast options such as egg white sandwiches, protein waffles, and overnight oats - Suggest protein smoothies with whey protein, spinach, and frozen fruits - Encourage physical  activity and return to the gym - Discuss the importance of not skipping meals to maintain metabolic rate  Prediabetes Stable on Wegovy for pharmacoprophylaxis continue current regimen   Suspected Sleep Apnea Patient has not yet completed the recommended sleep study. Untreated sleep apnea may be affecting his metabolic rate and overall energy regulation. - Complete sleep study to evaluate for sleep apnea  Follow-up - Schedule follow-up appointment in one month - Provide refills for [facility] and Metformin - Send prescriptions to [facility] pharmacy.       Objective   Physical Exam:  Blood pressure 131/83, pulse 75, temperature 97.7 F (36.5 C), height 5\' 6"  (1.676 m), weight (!) 443 lb (200.9 kg), SpO2 99%. Body mass index is 71.5 kg/m.  General: He is overweight, cooperative, alert, well developed, and in no acute distress. PSYCH: Has normal mood, affect and thought process.   HEENT: EOMI, sclerae are anicteric. Lungs: Normal breathing effort, no conversational dyspnea. Extremities: No edema.  Neurologic: No gross sensory or motor deficits. No tremors or fasciculations noted.    Diagnostic Data Reviewed:  BMET    Component Value Date/Time   NA 140 10/20/2022 1627   K 4.5 10/20/2022 1627   CL 100 10/20/2022 1627   CO2 21 10/20/2022 1627   GLUCOSE 101 (H) 10/20/2022 1627   GLUCOSE 118 (H) 03/15/2017 1139   BUN 14 10/20/2022 1627   CREATININE 0.87  10/20/2022 1627   CALCIUM 9.4 10/20/2022 1627   GFRNONAA 103 04/08/2020 0747   GFRAA 119 04/08/2020 0747   Lab Results  Component Value Date   HGBA1C 5.7 (H) 03/18/2023   HGBA1C 5.6 05/03/2017   Lab Results  Component Value Date   INSULIN 34.6 (H) 05/07/2022   Lab Results  Component Value Date   TSH 2.090 05/07/2022   CBC    Component Value Date/Time   WBC 7.4 05/07/2022 0911   WBC 8.2 03/16/2017 0132   RBC 4.78 05/07/2022 0911   RBC 4.56 03/16/2017 0132   HGB 14.2 05/07/2022 0911   HCT 42.5 05/07/2022 0911    PLT 252 05/07/2022 0911   MCV 89 05/07/2022 0911   MCH 29.7 05/07/2022 0911   MCH 30.3 03/16/2017 0132   MCHC 33.4 05/07/2022 0911   MCHC 34.1 03/16/2017 0132   RDW 13.0 05/07/2022 0911   Iron Studies No results found for: "IRON", "TIBC", "FERRITIN", "IRONPCTSAT" Lipid Panel     Component Value Date/Time   CHOL 135 05/07/2022 0911   TRIG 108 05/07/2022 0911   HDL 44 05/07/2022 0911   CHOLHDL 3.1 05/07/2022 0911   CHOLHDL 5.2 03/16/2017 0132   VLDL 47 (H) 03/16/2017 0132   LDLCALC 71 05/07/2022 0911   Hepatic Function Panel     Component Value Date/Time   PROT 6.3 10/20/2022 1627   ALBUMIN 4.1 10/20/2022 1627   AST 21 10/20/2022 1627   ALT 26 10/20/2022 1627   ALKPHOS 107 10/20/2022 1627   BILITOT 0.7 10/20/2022 1627   BILIDIR 0.13 12/09/2021 1458      Component Value Date/Time   TSH 2.090 05/07/2022 0911   Nutritional Lab Results  Component Value Date   VD25OH 43.8 03/18/2023   VD25OH 26.4 (L) 10/20/2022   VD25OH 32.4 05/07/2022    Follow-Up   No follow-ups on file.Marland Kitchen He was informed of the importance of frequent follow up visits to maximize his success with intensive lifestyle modifications for his multiple health conditions.  Attestation Statement   Reviewed by clinician on day of visit: allergies, medications, problem list, medical history, surgical history, family history, social history, and previous encounter notes.     Worthy Rancher, MD

## 2023-08-26 ENCOUNTER — Ambulatory Visit (INDEPENDENT_AMBULATORY_CARE_PROVIDER_SITE_OTHER): Payer: 59 | Admitting: Internal Medicine

## 2023-09-23 ENCOUNTER — Ambulatory Visit (INDEPENDENT_AMBULATORY_CARE_PROVIDER_SITE_OTHER): Payer: 59 | Admitting: Internal Medicine

## 2023-09-23 ENCOUNTER — Encounter (INDEPENDENT_AMBULATORY_CARE_PROVIDER_SITE_OTHER): Payer: Self-pay | Admitting: Internal Medicine

## 2023-09-23 ENCOUNTER — Other Ambulatory Visit: Payer: Self-pay

## 2023-09-23 VITALS — BP 115/78 | HR 70 | Temp 97.8°F | Ht 66.0 in | Wt >= 6400 oz

## 2023-09-23 DIAGNOSIS — R7303 Prediabetes: Secondary | ICD-10-CM | POA: Diagnosis not present

## 2023-09-23 DIAGNOSIS — I10 Essential (primary) hypertension: Secondary | ICD-10-CM

## 2023-09-23 DIAGNOSIS — K219 Gastro-esophageal reflux disease without esophagitis: Secondary | ICD-10-CM | POA: Diagnosis not present

## 2023-09-23 DIAGNOSIS — Z6841 Body Mass Index (BMI) 40.0 and over, adult: Secondary | ICD-10-CM

## 2023-09-23 MED ORDER — PANTOPRAZOLE SODIUM 40 MG PO TBEC
40.0000 mg | DELAYED_RELEASE_TABLET | Freq: Every day | ORAL | 0 refills | Status: DC
  Filled 2023-09-23: qty 30, 30d supply, fill #0

## 2023-09-23 MED ORDER — METFORMIN HCL 500 MG PO TABS
500.0000 mg | ORAL_TABLET | Freq: Two times a day (BID) | ORAL | 0 refills | Status: DC
Start: 2023-09-23 — End: 2023-10-26
  Filled 2023-09-23: qty 60, 30d supply, fill #0

## 2023-09-23 MED ORDER — SEMAGLUTIDE-WEIGHT MANAGEMENT 2.4 MG/0.75ML ~~LOC~~ SOAJ
2.4000 mg | SUBCUTANEOUS | 1 refills | Status: DC
  Filled 2023-09-23: qty 3, 28d supply, fill #0

## 2023-09-23 NOTE — Progress Notes (Signed)
 Office: [phone]  /  Fax: [phone]  Weight Summary And Biometrics  Vitals Temp: 97.8 F (36.6 C) BP: 115/78 Pulse Rate: 70 SpO2: 96 %   Anthropometric Measurements Height: 5\' 6"  (1.676 m) Weight: (!) 440 lb (199.6 kg) BMI (Calculated): 71.05 Weight at Last Visit: 445 lb Weight Lost Since Last Visit: 3 lb Weight Gained Since Last Visit: 0 Starting Weight: 465 lb Total Weight Loss (lbs): 25 lb (11.3 kg) Peak Weight: 480 lb   Body Composition  Body Fat %: 55.1 % Fat Mass (lbs): 242.6 lbs Muscle Mass (lbs): 188.2 lbs Visceral Fat Rating : 54    No data recorded Today's Visit #: 18  Starting Date: 05/17/22   Subjective   Chief Complaint: Obesity  Interval History Discussed the use of AI scribe software for clinical note transcription with the patient, who gave verbal consent to proceed.  History of Present Illness Jacob Hunt "Jacob Hunt or Jacob Hunt" is a 53 year old male with obesity, hypertension, coronary artery disease, heart failure, and prediabetes who presents for medical weight management.  He has lost three pounds since his last visit and follows a category four meal plan of eighteen hundred calories about sixty percent of the time. He incorporates more whole foods and aims for the recommended protein intake. Despite increased fluid intake, he occasionally skips meals, particularly breakfast, due to work demands. He has not been exercising and describes his job as sedentary. He reports adequate sleep and no high levels of stress.  His pharmacotherapy for obesity includes semaglutide 2.4 mg once a week and metformin 500 mg twice daily, which also treats his prediabetes. His last A1c was 5.7 in September 2024. Since starting [facility] in June 2024, he has lost a total of 25 pounds from his starting weight of 465 pounds, and 40 pounds from his peak weight, representing about 9% of his total body weight.  He reports falling twice in the past week, attributing  it to muscle weakness. He describes a fall while working on something and another incident stepping onto a trailer, resulting in a sore knee. He acknowledges decreased physical activity compared to his past, when he was more active and engaged in physical work.  He suspects having sleep apnea but has not undergone a sleep study due to insurance issues. A sleep study is scheduled for April 30th.  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    Challenges affecting patient progress:  chronic skipping of meals, low level of physical  .    Pharmacotherapy for weight management: He is currently taking Metformin (off label use for incretin effect and / or insulin resistance and / or diabetes prevention) with adequate clinical response  and without side effects. and [id number] with adequate clinical response  and without side effects..   Assessment and Plan   Treatment Plan For Obesity:  Recommended Dietary Goals  Jacob Hunt is currently in the action stage of change. As such, his goal is to continue weight management plan. He has agreed to: follow the Category 4 plan - 1800 kcal per day  Behavioral Health and Counseling  We discussed the following behavioral modification strategies today: increasing lean protein intake to established goals, avoiding skipping meals, increasing water intake , and work on meal planning and preparation.  Additional education and resources provided today: None  Recommended Physical Activity  Goals  Jacob Hunt has been advised to work up to 150 minutes of moderate intensity aerobic activity a week and strengthening exercises 2-3 times per week for cardiovascular health, weight loss maintenance and preservation of muscle mass.   He has agreed to :  Think about enjoyable ways to increase daily physical activity and overcoming  barriers to exercise and Increase physical activity in their day and reduce sedentary time (increase NEAT).  Pharmacotherapy  We discussed various medication options to help Tyrrell with his weight loss efforts and we both agreed to : adequate clinical response to current dose, continue current regimen  Associated Conditions Impacted by Obesity Treatment  Essential hypertension  Morbid obesity (HCC)  Gastroesophageal reflux disease without esophagitis  Pre-diabetes    Assessment and Plan Assessment & Plan Obesity He is affected by obesity and has been on semaglutide [facility]) since June 2024. He has lost 25 pounds from his starting weight of 465 pounds, representing about 9% of his total body weight. The weight loss has been gradual, attributed to a slowed metabolism rather than excess caloric intake. Increasing meal frequency and physical activity is emphasized to improve muscle activation and metabolism. The potential for gastric bypass was discussed, but concerns were raised about its effectiveness due to his unique metabolic challenges. He prefers to avoid surgical intervention and focus on lifestyle changes. - Continue semaglutide 2.4 mg once a week - Encourage three meals a day with adequate protein intake - Increase physical activity, including potential home workouts with dumbbells and a bench - Consider gym workouts at [employer] or work gym  Prediabetes with Insulin Resistance He has prediabetes with insulin resistance, managed with metformin and wegovy. His last A1c was 5.7% in September 2024. Increased physical activity can improve insulin secretion and help manage prediabetes. Muscle activation through exercise is crucial for improving insulin sensitivity. - Continue metformin 500 mg twice daily and wegovy - Increase physical activity to improve insulin sensitivity  Suspected Sleep Apnea He is suspected to have sleep apnea but has not undergone a sleep study due to  insurance issues. A sleep study is scheduled for November 03, 2023. Treating sleep apnea can aid in weight loss and improve overall health, including alertness and reducing fatigue. - Ensure completion of sleep study on November 03, 2023  Hypertension Well controlled continue current regimen.   General Health Maintenance Lifestyle modifications, including regular physical activity and meal frequency, are emphasized to improve overall health and manage weight. He is encouraged to plan and schedule exercise to overcome barriers. The importance of muscle activation for metabolic health is highlighted. - Encourage regular physical activity and structured exercise routine - Promote consistent meal frequency with three meals a day  Follow-up He is advised to continue with the current treatment plan and lifestyle modifications. - Schedule follow-up appointment in one month      Objective   Physical Exam:  Blood pressure 115/78, pulse 70, temperature 97.8 F (36.6 C), height 5\' 6"  (1.676 m), weight (!) 440 lb (199.6 kg), SpO2 96%. Body mass index is 71.02 kg/m.  General: He is overweight, cooperative, alert, well developed, and in no acute distress. PSYCH: Has normal mood, affect and thought process.   HEENT: EOMI, sclerae are anicteric. Lungs: Normal breathing effort, no conversational dyspnea. Extremities: No edema.  Neurologic: No gross sensory or motor deficits. No tremors or fasciculations noted.    Diagnostic Data Reviewed:  BMET    Component Value Date/Time   NA 140 10/20/2022 1627   K  4.5 10/20/2022 1627   CL 100 10/20/2022 1627   CO2 21 10/20/2022 1627   GLUCOSE 101 (H) 10/20/2022 1627   GLUCOSE 118 (H) 03/15/2017 1139   BUN 14 10/20/2022 1627   CREATININE 0.87 10/20/2022 1627   CALCIUM 9.4 10/20/2022 1627   GFRNONAA 103 04/08/2020 0747   GFRAA 119 04/08/2020 0747   Lab Results  Component Value Date   HGBA1C 5.7 (H) 03/18/2023   HGBA1C 5.6 05/03/2017   Lab Results   Component Value Date   INSULIN 34.6 (H) 05/07/2022   Lab Results  Component Value Date   TSH 2.090 05/07/2022   CBC    Component Value Date/Time   WBC 7.4 05/07/2022 0911   WBC 8.2 03/16/2017 0132   RBC 4.78 05/07/2022 0911   RBC 4.56 03/16/2017 0132   HGB 14.2 05/07/2022 0911   HCT 42.5 05/07/2022 0911   PLT 252 05/07/2022 0911   MCV 89 05/07/2022 0911   MCH 29.7 05/07/2022 0911   MCH 30.3 03/16/2017 0132   MCHC 33.4 05/07/2022 0911   MCHC 34.1 03/16/2017 0132   RDW 13.0 05/07/2022 0911   Iron Studies No results found for: "IRON", "TIBC", "FERRITIN", "IRONPCTSAT" Lipid Panel     Component Value Date/Time   CHOL 135 05/07/2022 0911   TRIG 108 05/07/2022 0911   HDL 44 05/07/2022 0911   CHOLHDL 3.1 05/07/2022 0911   CHOLHDL 5.2 03/16/2017 0132   VLDL 47 (H) 03/16/2017 0132   LDLCALC 71 05/07/2022 0911   Hepatic Function Panel     Component Value Date/Time   PROT 6.3 10/20/2022 1627   ALBUMIN 4.1 10/20/2022 1627   AST 21 10/20/2022 1627   ALT 26 10/20/2022 1627   ALKPHOS 107 10/20/2022 1627   BILITOT 0.7 10/20/2022 1627   BILIDIR 0.13 12/09/2021 1458      Component Value Date/Time   TSH 2.090 05/07/2022 0911   Nutritional Lab Results  Component Value Date   VD25OH 43.8 03/18/2023   VD25OH 26.4 (L) 10/20/2022   VD25OH 32.4 05/07/2022    Medications: Outpatient Encounter Medications as of 09/23/2023  Medication Sig   aspirin EC 81 MG tablet Take 1 tablet (81 mg total) by mouth daily.   atorvastatin (LIPITOR) 80 MG tablet Take 1 tablet (80 mg total) by mouth daily at 6 PM.   Cholecalciferol 125 MCG (5000 UT) capsule Take 5,000 Units by mouth daily.   furosemide (LASIX) 40 MG tablet Take 1 tablet (40 mg total) by mouth daily.   losartan (COZAAR) 50 MG tablet Take 1 tablet (50 mg total) by mouth daily.   metFORMIN (GLUCOPHAGE) 500 MG tablet Take 1 tablet (500 mg total) by mouth 2 (two) times daily with a meal.   metolazone (ZAROXOLYN) 2.5 MG tablet Take  one tablet once a week 30 minutes before you take the Furosemide 40mg    nitroGLYCERIN (NITROSTAT) 0.4 MG SL tablet Place 1 tablet (0.4 mg total) under the tongue every 5 (five) minutes x 3 doses as needed for chest pain.   omega-3 acid ethyl esters (LOVAZA) 1 g capsule Take 2 capsules (2 g total) by mouth daily.   pantoprazole (PROTONIX) 40 MG tablet Take 1 tablet (40 mg total) by mouth daily.   Psyllium 400 MG CAPS Take 400 mg by mouth daily.   Semaglutide-Weight Management 2.4 MG/0.75ML SOAJ Inject 2.4 mg into the skin once a week.   No facility-administered encounter medications on file as of 09/23/2023.     Follow-Up   No follow-ups  on file.Marland Kitchen He was informed of the importance of frequent follow up visits to maximize his success with intensive lifestyle modifications for his multiple health conditions.  Attestation Statement   Reviewed by clinician on day of visit: allergies, medications, problem list, medical history, surgical history, family history, social history, and previous encounter notes.     Worthy Rancher, MD

## 2023-09-27 ENCOUNTER — Ambulatory Visit (INDEPENDENT_AMBULATORY_CARE_PROVIDER_SITE_OTHER): Payer: 59 | Admitting: Internal Medicine

## 2023-10-26 ENCOUNTER — Other Ambulatory Visit: Payer: Self-pay

## 2023-10-26 ENCOUNTER — Encounter (INDEPENDENT_AMBULATORY_CARE_PROVIDER_SITE_OTHER): Payer: Self-pay | Admitting: Internal Medicine

## 2023-10-26 ENCOUNTER — Ambulatory Visit (INDEPENDENT_AMBULATORY_CARE_PROVIDER_SITE_OTHER): Admitting: Internal Medicine

## 2023-10-26 VITALS — BP 136/88 | HR 77 | Temp 97.3°F | Ht 66.0 in | Wt >= 6400 oz

## 2023-10-26 DIAGNOSIS — I10 Essential (primary) hypertension: Secondary | ICD-10-CM

## 2023-10-26 DIAGNOSIS — Z6841 Body Mass Index (BMI) 40.0 and over, adult: Secondary | ICD-10-CM

## 2023-10-26 DIAGNOSIS — R7303 Prediabetes: Secondary | ICD-10-CM | POA: Diagnosis not present

## 2023-10-26 DIAGNOSIS — R948 Abnormal results of function studies of other organs and systems: Secondary | ICD-10-CM | POA: Diagnosis not present

## 2023-10-26 DIAGNOSIS — R29818 Other symptoms and signs involving the nervous system: Secondary | ICD-10-CM

## 2023-10-26 MED ORDER — METFORMIN HCL 500 MG PO TABS
500.0000 mg | ORAL_TABLET | Freq: Two times a day (BID) | ORAL | 0 refills | Status: DC
  Filled 2023-10-26: qty 60, 30d supply, fill #0

## 2023-10-26 MED ORDER — SEMAGLUTIDE-WEIGHT MANAGEMENT 2.4 MG/0.75ML ~~LOC~~ SOAJ
2.4000 mg | SUBCUTANEOUS | 1 refills | Status: DC
  Filled 2023-10-26: qty 3, 28d supply, fill #0

## 2023-10-26 NOTE — Assessment & Plan Note (Signed)
 Complex obesity with a BMI of 71. He has been on Wegovy  since June of last year, resulting in an 8% total weight loss, which is clinically significant but less than the expected 15% with maximum dosage. He may not be consuming enough calories, which could be affecting metabolism and weight loss. The importance of adequate caloric intake and balanced nutrition was discussed, emphasizing the need for 1800 calories per day and 120-150 grams of protein. The use of tracking apps to monitor caloric and protein intake was recommended. The potential for future surgical intervention, such as gastric bypass, was mentioned as a continuum of care if medical management reaches a plateau. - Continue Wegovy  at current dose - Encourage use of tracking apps like My Net Diary to monitor caloric and protein intake. - Advise consuming 1800 calories per day with 120-150 grams of protein. - Encourage five meals a day: three main meals and two snacks. - Promote physical activity, aiming for at least one day of exercise per week. - Discuss potential for future gastric bypass if weight loss plateaus.

## 2023-10-26 NOTE — Assessment & Plan Note (Signed)
 Blood pressure above goal.  On losartan  50 mg without adverse effects.    Continue with weight loss therapy. Losing 10% may improve blood pressure control. Monitor for symptoms of orthostasis while losing weight. Continue current regimen and home monitoring for a goal blood pressure of 120/80.

## 2023-10-26 NOTE — Assessment & Plan Note (Signed)
 Sleep study is finally scheduled we will be following up on test results.

## 2023-10-26 NOTE — Progress Notes (Signed)
 Office: [phone]  /  Fax: [phone]  Weight Summary And Biometrics  Vitals Temp: (!) 97.3 F (36.3 C) BP: 136/88 Pulse Rate: 77 SpO2: 98 %   Anthropometric Measurements Height: 5\' 6"  (1.676 m) Weight: (!) 442 lb (200.5 kg) BMI (Calculated): 71.37 Weight at Last Visit: 440 lb Weight Lost Since Last Visit: 0 lb Weight Gained Since Last Visit: 2 lb Starting Weight: 465 lb Total Weight Loss (lbs): 23 lb (10.4 kg) Peak Weight: 480 lb   Body Composition  Body Fat %: 56.5 % Fat Mass (lbs): 249.8 lbs Muscle Mass (lbs): 183 lbs Visceral Fat Rating : 56    No data recorded Today's Visit #: 19  Starting Date: 05/17/22   Subjective   Chief Complaint: Obesity  Interval History Discussed the use of AI scribe software for clinical note transcription with the patient, who gave verbal consent to proceed.  History of Present Illness   Jacob Hunt is a 53 year old male with complex obesity who presents for medical weight management.  He has a body mass index of 71 and a history of coronary artery disease, diastolic heart failure, hypertension, insulin  resistance, and pre-diabetes. Possible sleep apnea is yet to be diagnosed. He started Wegovy  in June of the previous year, initially weighing around 480 pounds, and has since lost approximately 8% of his total body weight, now weighing around 448 pounds. Despite this weight loss, it is less than the typical 15% expected with the medication.  He experiences an increase in cravings for sweets but denies significant changes in his carbohydrate intake, aside from consuming sandwiches. His current diet includes a breakfast of scrambled eggs and bacon from [employer], and a grilled chicken filet from [employer], which he acknowledges may not be sufficient in calories. He typically has a sandwich for lunch. He is unsure of his total daily caloric intake but believes it is less than the 2600 calories needed to maintain his current weight.  He has not used any apps to track his food intake.  He has resumed using his tractor, which he notes has led to swelling in his legs returning. He operates a IT trainer, which contributes to a sedentary lifestyle, and has a cooler in his tractor for storing food.       Challenges affecting patient progress: need for convenience or prepackaged foods, low volume of physical activity at present , and slow metabolism for age.    Pharmacotherapy for weight management: He is currently taking Wegovy  with adequate clinical response  and without side effects..   Assessment and Plan   Treatment Plan For Obesity:  Recommended Dietary Goals  Jacob Hunt is currently in the action stage of change. As such, his goal is to continue weight management plan. He has agreed to: keep a food journal with a target of  1800 calories per day and 120 - 150 grams of protein per day  or 40 - 50 grams per meal.  Work on doing 3 balanced meals a day with 2 snacks in between he needs to increase the frequency of his meals and calories  [employer] and Counseling  We discussed the following behavioral modification strategies today: increasing lean protein intake to established goals, decreasing simple carbohydrates , increasing vegetables, increasing lower glycemic fruits, increasing fiber rich foods, avoiding skipping meals, increasing water intake , work on meal planning and preparation, work on tracking and journaling calories using tracking application, reading food labels , and increasing frequency of meals .  Additional education and resources  provided today: Handout and personalized instruction on tracking and journaling using Apps  Recommended Physical Activity Goals  Jacob Hunt has been advised to work up to 150 minutes of moderate intensity aerobic activity a week and strengthening exercises 2-3 times per week for cardiovascular health, weight loss maintenance and preservation of muscle mass.   He has agreed to :   Think about enjoyable ways to increase daily physical activity and overcoming barriers to exercise and Increase physical activity in their day and reduce sedentary time (increase NEAT).  Pharmacotherapy  We discussed various medication options to help Jacob Hunt with his weight loss efforts and we both agreed to :  Patient has lost weight since starting Wegovy  but out of blunted rate of about 8% of total body weight loss he will continue GLP-1 therapy.  Associated Conditions Impacted by Obesity Treatment  Essential hypertension Assessment & Plan: Blood pressure above goal.  On losartan  50 mg without adverse effects.    Continue with weight loss therapy. Losing 10% may improve blood pressure control. Monitor for symptoms of orthostasis while losing weight. Continue current regimen and home monitoring for a goal blood pressure of 120/80.     Morbid obesity (HCC) Assessment & Plan: Complex obesity with a BMI of 71. He has been on Wegovy  since June of last year, resulting in an 8% total weight loss, which is clinically significant but less than the expected 15% with maximum dosage. He may not be consuming enough calories, which could be affecting metabolism and weight loss. The importance of adequate caloric intake and balanced nutrition was discussed, emphasizing the need for 1800 calories per day and 120-150 grams of protein. The use of tracking apps to monitor caloric and protein intake was recommended. The potential for future surgical intervention, such as gastric bypass, was mentioned as a continuum of care if medical management reaches a plateau. - Continue Wegovy  at current dose - Encourage use of tracking apps like [employer] to monitor caloric and protein intake. - Advise consuming 1800 calories per day with 120-150 grams of protein. - Encourage five meals a day: three main meals and two snacks. - Promote physical activity, aiming for at least one day of exercise per week. - Discuss  potential for future gastric bypass if weight loss plateaus.  Orders: -     Semaglutide -Weight Management; Inject 2.4 mg into the skin once a week.  Dispense: 3 mL; Refill: 1  Pre-diabetes Assessment & Plan: Most recent A1c is  Lab Results  Component Value Date   HGBA1C 5.7 (H) 03/18/2023   HGBA1C 5.6 05/03/2017    Currently on metformin  and Wegovy  for pharmacoprophylaxis.  Continue current regimen.  Patient also advised on maintaining a diet with a low glycemic load.   Orders: -     metFORMIN  HCl; Take 1 tablet (500 mg total) by mouth 2 (two) times daily with a meal.  Dispense: 180 tablet; Refill: 0  Suspected sleep apnea Assessment & Plan: Sleep study is finally scheduled we will be following up on test results.   Abnormal metabolism Assessment & Plan: Patient has a slower than predicted metabolism. IC 2592 vs. calculated 2927. This may contribute to weight gain, chronic fatigue and difficulty losing weight.   He is suspected to have sleep apnea which is untreated.  He is scheduled for sleep study.  We reviewed measures to improve metabolism including not skipping meals, progressive strengthening exercises, increasing protein intake at every meal and maintaining adequate hydration and sleep.  Objective   Physical Exam:  Blood pressure 136/88, pulse 77, temperature (!) 97.3 F (36.3 C), height 5\' 6"  (1.676 m), weight (!) 442 lb (200.5 kg), SpO2 98%. Body mass index is 71.34 kg/m.  General: He is overweight, cooperative, alert, well developed, and in no acute distress. PSYCH: Has normal mood, affect and thought process.   HEENT: EOMI, sclerae are anicteric. Lungs: Normal breathing effort, no conversational dyspnea. Extremities: No edema.  Neurologic: No gross sensory or motor deficits. No tremors or fasciculations noted.    Diagnostic Data Reviewed:  BMET    Component Value Date/Time   NA 140 10/20/2022 1627   K 4.5 10/20/2022 1627   CL 100  10/20/2022 1627   CO2 21 10/20/2022 1627   GLUCOSE 101 (H) 10/20/2022 1627   GLUCOSE 118 (H) 03/15/2017 1139   BUN 14 10/20/2022 1627   CREATININE 0.87 10/20/2022 1627   CALCIUM  9.4 10/20/2022 1627   GFRNONAA 103 04/08/2020 0747   GFRAA 119 04/08/2020 0747   Lab Results  Component Value Date   HGBA1C 5.7 (H) 03/18/2023   HGBA1C 5.6 05/03/2017   Lab Results  Component Value Date   INSULIN  34.6 (H) 05/07/2022   Lab Results  Component Value Date   TSH 2.090 05/07/2022   CBC    Component Value Date/Time   WBC 7.4 05/07/2022 0911   WBC 8.2 03/16/2017 0132   RBC 4.78 05/07/2022 0911   RBC 4.56 03/16/2017 0132   HGB 14.2 05/07/2022 0911   HCT 42.5 05/07/2022 0911   PLT 252 05/07/2022 0911   MCV 89 05/07/2022 0911   MCH 29.7 05/07/2022 0911   MCH 30.3 03/16/2017 0132   MCHC 33.4 05/07/2022 0911   MCHC 34.1 03/16/2017 0132   RDW 13.0 05/07/2022 0911   Iron Studies No results found for: "IRON", "TIBC", "FERRITIN", "IRONPCTSAT" Lipid Panel     Component Value Date/Time   CHOL 135 05/07/2022 0911   TRIG 108 05/07/2022 0911   HDL 44 05/07/2022 0911   CHOLHDL 3.1 05/07/2022 0911   CHOLHDL 5.2 03/16/2017 0132   VLDL 47 (H) 03/16/2017 0132   LDLCALC 71 05/07/2022 0911   Hepatic Function Panel     Component Value Date/Time   PROT 6.3 10/20/2022 1627   ALBUMIN 4.1 10/20/2022 1627   AST 21 10/20/2022 1627   ALT 26 10/20/2022 1627   ALKPHOS 107 10/20/2022 1627   BILITOT 0.7 10/20/2022 1627   BILIDIR 0.13 12/09/2021 1458      Component Value Date/Time   TSH 2.090 05/07/2022 0911   Nutritional Lab Results  Component Value Date   VD25OH 43.8 03/18/2023   VD25OH 26.4 (L) 10/20/2022   VD25OH 32.4 05/07/2022    Medications: Outpatient Encounter Medications as of 10/26/2023  Medication Sig   aspirin  EC 81 MG tablet Take 1 tablet (81 mg total) by mouth daily.   atorvastatin  (LIPITOR ) 80 MG tablet Take 1 tablet (80 mg total) by mouth daily at 6 PM.   Cholecalciferol  125 MCG (5000 UT) capsule Take 5,000 Units by mouth daily.   furosemide  (LASIX ) 40 MG tablet Take 1 tablet (40 mg total) by mouth daily.   losartan  (COZAAR ) 50 MG tablet Take 1 tablet (50 mg total) by mouth daily.   metolazone  (ZAROXOLYN ) 2.5 MG tablet Take one tablet once a week 30 minutes before you take the Furosemide  40mg    nitroGLYCERIN  (NITROSTAT ) 0.4 MG SL tablet Place 1 tablet (0.4 mg total) under the tongue every 5 (five) minutes x 3  doses as needed for chest pain.   omega-3 acid ethyl esters (LOVAZA ) 1 g capsule Take 2 capsules (2 g total) by mouth daily.   pantoprazole  (PROTONIX ) 40 MG tablet Take 1 tablet (40 mg total) by mouth daily.   Psyllium 400 MG CAPS Take 400 mg by mouth daily.   [DISCONTINUED] metFORMIN  (GLUCOPHAGE ) 500 MG tablet Take 1 tablet (500 mg total) by mouth 2 (two) times daily with a meal.   [DISCONTINUED] Semaglutide -Weight Management 2.4 MG/0.75ML SOAJ Inject 2.4 mg into the skin once a week.   metFORMIN  (GLUCOPHAGE ) 500 MG tablet Take 1 tablet (500 mg total) by mouth 2 (two) times daily with a meal.   Semaglutide -Weight Management 2.4 MG/0.75ML SOAJ Inject 2.4 mg into the skin once a week.   No facility-administered encounter medications on file as of 10/26/2023.     Follow-Up   Return in about 6 weeks (around 12/07/2023) for For Weight Mangement with Dr. Allie Area.Aaron Aas He was informed of the importance of frequent follow up visits to maximize his success with intensive lifestyle modifications for his multiple health conditions.  Attestation Statement   Reviewed by clinician on day of visit: allergies, medications, problem list, medical history, surgical history, family history, social history, and previous encounter notes.   I have spent 49 minutes in the care of the patient today including: 2 minutes before the visit reviewing and prepping the chart 40 minutes face-to-face assessing and reviewing listed medical problems as outlined in obesity care plan,  providing nutritional and behavioral counseling as outlined in obesity care plan, counseling regarding anti-obesity medication as outlined in obesity care plan, independently interpreting results and goals of care, see listed medical problems, discussing biometric information and progress, reviewing pertinent diagnostics which are listed under medical problems, and ordering medications - see orders 7 minutes after the visit updating chart and documentation    Ladd Picker, MD

## 2023-10-26 NOTE — Assessment & Plan Note (Signed)
 Most recent A1c is  Lab Results  Component Value Date   HGBA1C 5.7 (H) 03/18/2023   HGBA1C 5.6 05/03/2017    Currently on metformin  and Wegovy  for pharmacoprophylaxis.  Continue current regimen.  Patient also advised on maintaining a diet with a low glycemic load.

## 2023-10-26 NOTE — Assessment & Plan Note (Signed)
 Patient has a slower than predicted metabolism. IC 2592 vs. calculated 2927. This may contribute to weight gain, chronic fatigue and difficulty losing weight.   He is suspected to have sleep apnea which is untreated.  He is scheduled for sleep study.  We reviewed measures to improve metabolism including not skipping meals, progressive strengthening exercises, increasing protein intake at every meal and maintaining adequate hydration and sleep.

## 2023-11-03 ENCOUNTER — Ambulatory Visit (HOSPITAL_BASED_OUTPATIENT_CLINIC_OR_DEPARTMENT_OTHER): Attending: Cardiovascular Disease | Admitting: Cardiovascular Disease

## 2023-11-03 DIAGNOSIS — R0902 Hypoxemia: Secondary | ICD-10-CM | POA: Diagnosis not present

## 2023-11-03 DIAGNOSIS — Z6841 Body Mass Index (BMI) 40.0 and over, adult: Secondary | ICD-10-CM | POA: Diagnosis not present

## 2023-11-03 DIAGNOSIS — G4733 Obstructive sleep apnea (adult) (pediatric): Secondary | ICD-10-CM | POA: Insufficient documentation

## 2023-11-03 DIAGNOSIS — G4730 Sleep apnea, unspecified: Secondary | ICD-10-CM

## 2023-11-16 NOTE — Procedures (Signed)
 [facility] [address] [place], [place] [address] Tel: [phone]   Fax: [phone]  Titration Interpretation  Patient Name:  Jacob Hunt, Jacob Hunt Date:  11/03/2023 Referring Physician:  Dr. Magnus Schuller  Indications for Polysomnography The patient is a 53 year old Male who is 5\' 5"  and weighs 440.0 lbs. His BMI equals 73.3.  A split night CPAP titration treatment study was performed.  Medication were taken during the night.  No Data.   Polysomnogram Data A full night polysomnogram recorded the standard physiologic parameters including EEG, EOG, EMG, EKG, nasal and oral airflow.  Respiratory parameters of chest and abdominal movements were recorded with Respiratory Inductance Plethysmography belts.  Oxygen saturation was recorded by pulse oximetry.   Sleep Architecture The total recording time of the polysomnogram was 370.4 minutes.  The total sleep time was 226.0 minutes.  The patient spent 17.5% of total sleep time in Stage N1, 51.8% in Stage N2, 0.0% in Stages N3, and 30.8% in REM.  Sleep latency was 17.3 minutes.  REM latency was 19.5 minutes.  Sleep Efficiency was 61.0%.  Wake after Sleep Onset time was 127.0 minutes.  Diagnostic Summary The patient was found to have severe sleep apnea with overall AHI 30.7/h, supine AHI 39.1/h and very severe during REM sleep at 85.2/h.  Titration Summary The patient was titrated at pressures ranging from 6* cm/H20 up to 18* cm/H20 . The last pressure used in the study was 18* cm/H20. AHI at 14 cm was 27.1/h, at 16cm 2.9/h and at 18 cm was 0.  Respiratory Events The polysomnogram revealed a presence of 15 obstructive, - central, and - mixed apneas resulting in an Apnea index of 4.0 events per hour.  There were 112 hypopneas (>=3% desaturation and/or arousal) resulting in an Apnea\Hypopnea Index (AHI >=3% desaturation and/or arousal) of 33.7 events per hour.  There were 74 hypopneas (>=4%  desaturation) resulting in an Apnea\Hypopnea Index (AHI >=4% desaturation) of 23.6 events per hour.  There were 32 Respiratory Effort Related Arousals resulting in a RERA index of 8.5 events per hour. The Respiratory Disturbance Index is 42.2 events per hour.  The snore index was - events per hour.  Mean oxygen saturation was 92.6%.  The lowest oxygen saturation during sleep was 75.0%.  Time spent <=88% oxygen saturation was 24.3 minutes (6.6%).  Limb Activity There were no significant limb movements recorded.  Of this total, - were classified as PLMs.  Of the PLMs, - were associated with arousals.  The Limb Movement index was - per hour while the PLM index was - per hour.  Cardiac Summary The average pulse rate was 70.0 bpm.  The minimum pulse rate was 55.0 bpm while the maximum pulse rate was 97.0 bpm.  There were occasional PACs.   Diagnosis:  Severe obstructive sleep apnea with overall AHI 30.7/h ; supine sleep AHI 39.1 and REM sleep AHI on the diagnostic portion of the study at 85.2/h.  Nocturnal hypoxemia Super morbid obesity  Recommendations: Recommend initiation of CPAP Auto therapy with EPR of 3 at 15 - 20 cm of water. Effort should be made to optimize nasal and oropharyngeal patency. Avoid alcohol, sedatives and CNS depressants that may worsen sleep apnea and disrupt normal sleep architecture. Sleep hygiene should be reviewed. Weight management and regular exercise should be initiated.   This study was personally reviewed and electronically signed by: Dr. Magnus Schuller Accredited Board Certified in Sleep Medicine Date/Time: Nov 16, 2023/12:11 pm  Titration Report  Patient Name: Jacob Hunt Date: 11/03/2023  Date of Birth: 1971-06-05 Study Type: Split Night  Age: 44 year MRN #: [id number]  Sex: Male Interpreting Physician: RODOLPHO, HAARMANN [id number]  Height: 5\' 5"  Referring Physician: Dr. Magnus Schuller  Weight: 440.0 lbs Recording Tech: Roosvelt Colla RRT RPSGT RST   BMI: 73.3 Scoring Tech: Roosvelt Colla RRT RPSGT RST  ESS: 2 Neck Size: 18  Mask Type [employer] Simplus Final Pressure: 18 CMH2O  Mask Size: Medium Supplemental O2: -   Study Overview  Lights Off: 11:16:27 PM  Count Index  Lights On: 05:26:48 AM Awakenings: 38 10.1  Time in Bed: 370.4 min. Arousals: 96 25.5  Total Sleep Time: 226.0 min. AHI (>=3% Desat and/or Ar.): 127 33.7   Sleep Efficiency: 61.0% AHI (>=4% Desat): 89 23.6   Sleep Latency: 17.3 min. Limb Movements: - -  Wake After Sleep Onset: 127.0 min. Snore: - -  REM Latency from Sleep Onset: 19.5 min. Desaturations: 132 35.0     Minimum SpO2 TST: 75.0%    Sleep Architecture  % of Time in Bed Stages Time (mins) % Sleep Time  Wake 145.0   Stage N1 39.5 17.5%  Stage N2 117.0 51.8%  Stage N3 0.0 0.0%  REM 69.5 30.8%   Arousal Summary   NREM REM Sleep Index  Respiratory Arousals 45 14 59 15.7  PLM Arousals - - - -  Isolated Limb Movement Arousals - - - -  Snore Arousals - - - -  Spontaneous Arousals 29 8 37 9.8  Total 74 22 96 25.5   Limb Movement Summary   Count Index  Isolated Limb Movements - -  Periodic Limb Movements (PLMs) - -  Total Limb Movements - -    Respiratory Summary   By Sleep Stage By Body Position Total   NREM REM Supine Non-Supine   Time (min) 156.5 69.5 147.0 79.0 226.0         Obstructive Apnea 7 8 15  - 15  Mixed Apnea - - - - -  Central Apnea - - - - -  Total Apneas 7 8 15  - 15  Total Apnea Index 2.7 6.9 6.1 - 4.0         Hypopneas (>=3% Desat and/or Ar.) 65 47 78 34 112  AHI (>=3% Desat and/or Ar.) 27.6 47.5 38.0 25.8 33.7         Hypopneas (>=4% Desat) 37 37 51 23 74  AHI (>=4% Desat) 16.9 38.8 26.9 17.5 23.6          RERAs 31 1 30 2  32  RERA Index 11.9 0.9 12.2 1.5 8.5         RDI 39.5 48.3 50.2 27.3 42.2     Respiratory Event Durations   Apnea Hypopnea   NREM REM NREM REM  Average (seconds) 17.8 19.4 21.3 24.8  Maximum (seconds) 23.4 32.1 54.2 89.8    Oxygen  Saturation Summary   Wake NREM REM TST TIB  Average SpO2 93.4% 92.6% 90.9% 92.1% 92.6%  Minimum SpO2 74.0% 75.0% 75.0% 75.0% 74.0%  Maximum SpO2 98.0% 98.0% 98.0% 98.0% 98.0%   Oxygen Saturation Distribution  Range (%) Time in range (min) Time in range (%)  90.0 - 100.0 315.1 85.7%  80.0 - 90.0 49.1 13.4%  70.0 - 80.0 3.4 0.9%  60.0 - 70.0 - -  50.0 - 60.0 - -  0.0 - 50.0 - -  Time Spent <=88% SpO2  Range (%) Time in  range (min) Time in range (%)  0.0 - 88.0 24.3 6.6%      Count Index  Desaturations 132 35.0    Cardiac Summary   Wake NREM REM Sleep Total  Average Pulse Rate (BPM) 69.9 70.3 69.8 70.2 70.0  Minimum Pulse Rate (BPM) 57.0 55.0 56.0 55.0 55.0  Maximum Pulse Rate (BPM) 97.0 93.0 89.0 93.0 97.0   Pulse Rate Distribution:  Range (bpm) Time in range (min) Time in range (%)  0.0 - 40.0 - -  40.0 - 60.0 9.8 2.6%  60.0 - 80.0 320.9 86.7%  80.0 - 100.0 34.1 9.2%  100.0 - 120.0 - -  120.0 - 140.0 - -  140.0 - 200.0 - -   Titration Summary  PAP Device PAP Level O2 Level Time (min) Wake (min) NREM (min) REM (min) Sleep Eff% OA# CA# MA# Hyp# (>=3%) AHI (>=3%) Hyp# (>=4%) AHI (>=%4) RERA RDI SpO2 <=88% (min) Min SpO2 Mean SpO2 Ar. Index  - Off - 172.5 47.5 109.5 15.5 72.5% 1 - - 63 30.7 42  20.6 12  36.5  12.1 75.0 91.9 19.7  CPAP 6 - 87.0 72.0 15.0 0.0 17.2% 1 - - 16 68.0 8  36.0 11  112.0  0.3 87.0 91.9 92.0  CPAP 8 - 16.5 4.0 4.5 8.0 75.8% 13 - - 11 115.2 8  100.8 -  115.2  3.2 78.0 90.8 57.6  CPAP 10 - 14.5 0.0 0.5 14.0 100.0% - - - 8 33.1 6  24.8 1  37.2  6.0 83.0 89.5 12.4  CPAP 12 - 14.0 1.5 1.0 11.5 89.3% - - - 6 28.8 3  14.4 -  28.8  0.1 88.0 92.3 9.6  CPAP 14 - 16.0 0.5 0.5 15.0 96.9% - - - 7 27.1 7  27.1 -  27.1  0.0 89.0 93.5 3.9  CPAP 16 - 39.5 19.0 15.0 5.5 51.9% - - - 1 2.9 -  - 7  23.4  0.0 92.0 93.7 32.2  CPAP 18 - 11.0 0.5 10.5 0.0 95.5% - - - - - -  - 1  5.7  0.0 93.0 93.7 17.1     Hypnograms                           Technologist Comments  Patient was ordered as a split night. Patient is a 53 year old white male who was sent to the sleep center for OSA and sleep disorder breathing. Patient met split night criteria per standing protocol. Patient was placed on CPAP of 6 CMH2O at 11:16 pm and was increased to a CPAP pressure of 18 cmh2o with 2 CMH2O of EPR with no audible snoring noted. Patient was increased for respiratory events with and without a 4% Desat or arousal, and audible snoring. Patient tolerated CPAP trial fairly well. No oxygen was applied. No medications were reported taken. PLM's/PLMA's were noted. Questionable cardiac arrhythmias were noted see epochs for examples: 286, 357, 361, 377, 414, 506 etc. No restroom visits were noted. Patient was fitted with a [employer] Simplus nasal/oral full-face mask size medium with heated humidification.     Magnus Schuller, MD, [facility], ABSM Diplomate, American Board of Sleep Medicine  ELECTRONICALLY SIGNED ON:  11/16/2023, 12:13 PM [facility] PH: [phone]   FX: [phone] ACCREDITED BY THE AMERICAN ACADEMY OF SLEEP MEDICINE

## 2023-11-17 ENCOUNTER — Telehealth: Payer: Self-pay

## 2023-11-17 NOTE — Telephone Encounter (Signed)
-----   Message from Magnus Schuller sent at 11/16/2023 12:41 PM EDT ----- Awanda Lennert, please notify pt of the results and initiate CPAP Auto at 15 - 20 cm as Rx.

## 2023-11-17 NOTE — Telephone Encounter (Signed)
 Left VM with callback number for patient to receive Titration results and recommendations.

## 2023-11-23 ENCOUNTER — Other Ambulatory Visit: Payer: Self-pay

## 2023-11-23 ENCOUNTER — Encounter (INDEPENDENT_AMBULATORY_CARE_PROVIDER_SITE_OTHER): Payer: Self-pay | Admitting: Physician Assistant

## 2023-11-23 ENCOUNTER — Ambulatory Visit (INDEPENDENT_AMBULATORY_CARE_PROVIDER_SITE_OTHER): Admitting: Physician Assistant

## 2023-11-23 VITALS — BP 119/77 | HR 62 | Temp 97.7°F | Ht 66.0 in | Wt >= 6400 oz

## 2023-11-23 DIAGNOSIS — R7303 Prediabetes: Secondary | ICD-10-CM

## 2023-11-23 DIAGNOSIS — I110 Hypertensive heart disease with heart failure: Secondary | ICD-10-CM | POA: Diagnosis not present

## 2023-11-23 DIAGNOSIS — Z7984 Long term (current) use of oral hypoglycemic drugs: Secondary | ICD-10-CM

## 2023-11-23 DIAGNOSIS — G4733 Obstructive sleep apnea (adult) (pediatric): Secondary | ICD-10-CM | POA: Diagnosis not present

## 2023-11-23 DIAGNOSIS — Z7985 Long-term (current) use of injectable non-insulin antidiabetic drugs: Secondary | ICD-10-CM

## 2023-11-23 DIAGNOSIS — I5032 Chronic diastolic (congestive) heart failure: Secondary | ICD-10-CM

## 2023-11-23 DIAGNOSIS — I10 Essential (primary) hypertension: Secondary | ICD-10-CM

## 2023-11-23 DIAGNOSIS — K219 Gastro-esophageal reflux disease without esophagitis: Secondary | ICD-10-CM

## 2023-11-23 DIAGNOSIS — I2510 Atherosclerotic heart disease of native coronary artery without angina pectoris: Secondary | ICD-10-CM

## 2023-11-23 DIAGNOSIS — E559 Vitamin D deficiency, unspecified: Secondary | ICD-10-CM

## 2023-11-23 MED ORDER — SEMAGLUTIDE-WEIGHT MANAGEMENT 2.4 MG/0.75ML ~~LOC~~ SOAJ
2.4000 mg | SUBCUTANEOUS | 1 refills | Status: DC
  Filled 2023-11-23: qty 3, 28d supply, fill #0

## 2023-11-23 MED ORDER — METFORMIN HCL 500 MG PO TABS
500.0000 mg | ORAL_TABLET | Freq: Two times a day (BID) | ORAL | 0 refills | Status: DC
  Filled 2023-11-23: qty 60, 30d supply, fill #0

## 2023-11-23 MED ORDER — PANTOPRAZOLE SODIUM 40 MG PO TBEC
40.0000 mg | DELAYED_RELEASE_TABLET | Freq: Every day | ORAL | 0 refills | Status: DC
  Filled 2023-11-23: qty 30, 30d supply, fill #0

## 2023-11-23 NOTE — Progress Notes (Signed)
 SUBJECTIVE: Discussed the use of AI scribe software for clinical note transcription with the patient, who gave verbal consent to proceed.  Chief Complaint: Obesity  Interim History: He is down 4 lbs since last visit.   Jacob Hunt is here to discuss his progress with his obesity treatment plan. He is on the Category 4 Plan and keeping a food journal and adhering to recommended goals of 1800 calories and 120-150 grams of protein and states he is following his eating plan approximately 50-60 % of the time. He states he is not exercising 0 minutes 0 times per week.  Jacob Hunt "Jacob Hunt or Jacob Hunt" is a 53 year old male with obesity and obstructive sleep apnea who presents for obesity treatment plan.  He is currently on Wegovy  2.4 mg weekly for weight management and has experienced a weight loss of four pounds recently. He experiences significant belching and gas pressure in his stomach on Mondays after taking his Wegovy  shot on Sunday nights, which makes eating uncomfortable. He has been on this medication for three to four months and notes that the initial constipation has improved. He is not experiencing hunger but is concerned about not eating enough, as he often skips meals.  He has a history of obstructive sleep apnea, recently confirmed by a sleep study. He has not yet started CPAP therapy. He experiences shallow breathing during sleep and has severe obstructive sleep apnea per sleep study per Dr. Loetta Ringer  His past medical history includes prediabetes, hypertension, vitamin D  deficiency, and coronary artery disease with chronic diastolic congestive heart failure. He is currently taking Protonix  40 mg daily and metformin  500 mg twice daily. He typically consumes a low-carb breakfast from [employer] or [employer] and occasionally drinks protein shakes. He has reduced his soda intake to one 16-ounce Coca[employer] per day and is working on improving his diet by incorporating more protein-rich snacks like  Chomps and cheese sticks.  He works four days a week, often in a tractor, which limits his physical activity. He has noticed leg swelling due to prolonged sitting and wears compression socks to manage this. He plans to retire in about a year and is considering increasing his physical activity. 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  Challenges affecting patient progress: need for convenience or prepackaged foods, low volume of physical activity at present , and slow metabolism for age.    Pharmacotherapy for weight management: He is currently taking Wegovy  with adequate clinical response  and without side effects..  OBJECTIVE: Visit Diagnoses: Problem List Items Addressed This Visit     Morbid obesity (HCC) with starting BMI 75   Relevant Medications   Semaglutide -Weight Management 2.4 MG/0.75ML SOAJ   metFORMIN  (GLUCOPHAGE ) 500 MG tablet   Essential hypertension   Chronic diastolic CHF (congestive heart failure) (HCC)   CAD (coronary artery disease)   GERD (gastroesophageal reflux disease)   Relevant Medications   pantoprazole  (PROTONIX ) 40 MG tablet   Vitamin D  deficiency   Pre-diabetes   Relevant Medications   metFORMIN  (GLUCOPHAGE ) 500 MG tablet   Other Visit Diagnoses       OSA (obstructive sleep apnea)    -  Primary      Obstructive Sleep Apnea Severe obstructive sleep apnea confirmed - See report notes from study per Dr. Loetta Ringer. . CPAP therapy is recommended to improve oxygenation and reduce cardiac strain. Potential switch to Zepbound for  weight loss benefits discussed post-CPAP initiation. Diagnosis:  Severe obstructive sleep apnea with overall AHI 30.7/h ; supine sleep AHI 39.1 and REM sleep AHI on the diagnostic portion of the study at 85.2/h.  Nocturnal hypoxemia Super morbid obesity   Recommendations: Recommend initiation of CPAP Auto therapy with EPR of  3 at 15 - 20 cm of water. Effort should be made to optimize nasal and oropharyngeal patency. Avoid alcohol, sedatives and CNS depressants that may worsen sleep apnea and disrupt normal sleep architecture. Sleep hygiene should be reviewed. Weight management and regular exercise should be initiated. - Initiate CPAP therapy - Discuss Zepbound as a potential treatment option after CPAP initiation  Intensive lifestyle modifications are the first line treatment for this issue. We discussed several lifestyle modifications today and he will continue to work on diet, exercise and weight loss efforts. We will continue to monitor. Orders and follow up as documented in patient record.   Obesity Ongoing obesity management with Wegovy  2.4 mg weekly, effective for weight loss. Potential switch to Zepbound, a GLP-1 and GIP combination, may offer better weight loss outcomes and fewer gastrointestinal side effects. Wegovy  preferred due to its indication for hyperlipidemia and heart disease, aligning with insurance coverage. Reports belching and gas on Mondays post-Wegovy  injections on Sundays, but improved constipation. Emphasis on increasing protein intake to prevent muscle loss and support weight loss. - Continue Wegovy  2.4 mg weekly - Emphasize protein intake of 120-150 grams per day - Consider Zepbound if insurance coverage allows and CPAP therapy is initiated - Encourage meal prepping and high-protein snacks - Discuss potential benefits of pre-prepared high-protein meals from local stores  Chronic Diastolic Congestive Heart Failure Chronic diastolic congestive heart failure managed with current medications. Weight loss and sleep apnea management are crucial to reducing cardiac strain. Appears well compensated overall. He does report some increased leg edema lately. No chest pain, increased work of breathing or palpitations.  - Continue current heart failure management plan  Hypertension Hypertension  management ongoing. Treating sleep apnea may improve blood pressure control. BP Readings from Last 3 Encounters:  11/23/23 119/77  10/26/23 136/88  09/23/23 115/78   Lab Results  Component Value Date   NA 140 10/20/2022   CL 100 10/20/2022   K 4.5 10/20/2022   CO2 21 10/20/2022   BUN 14 10/20/2022   CREATININE 0.87 10/20/2022   EGFR 106 10/20/2022   CALCIUM  9.4 10/20/2022   ALBUMIN 4.1 10/20/2022   GLUCOSE 101 (H) 10/20/2022   Continue losartan  50 mg daily, usual Lasix /zaroxolyn  for edema control/volume management Monitor for hypotension as weight loss continues with treatment with wegovy .   Prediabetes Prediabetes managed with lifestyle modifications and metformin  500 mg twice daily. Emphasis on dietary changes, particularly increasing protein intake and reducing high-calorie, low-nutrient foods. Lab Results  Component Value Date   HGBA1C 5.7 (H) 03/18/2023   HGBA1C 6.0 (H) 10/20/2022   HGBA1C 5.9 (H) 05/07/2022   Lab Results  Component Value Date   LDLCALC 71 05/07/2022   CREATININE 0.87 10/20/2022   INSULIN   Date Value Ref Range Status  05/07/2022 34.6 (H) 2.6 - 24.9 uIU/mL Final  ] - Continue metformin  500 mg twice daily and Wegovy .  - Focus on dietary changes to increase protein intake and reduce high-calorie snacks Continue working on nutrition plan to decrease simple carbohydrates, increase lean proteins and exercise to promote weight loss, improve glycemic control and prevent progression to Type 2 diabetes.     Vitals Temp: 97.7 F (36.5 C) BP:  119/77 Pulse Rate: 62 SpO2: 97 %   Anthropometric Measurements Height: 5\' 6"  (1.676 m) Weight: (!) 438 lb (198.7 kg) BMI (Calculated): 70.73 Weight at Last Visit: 442 lb Weight Lost Since Last Visit: 4 lb Weight Gained Since Last Visit: 0 Starting Weight: 465 lb Total Weight Loss (lbs): 27 lb (12.2 kg) Peak Weight: 480 lb   Body Composition  Body Fat %: 55.6 % Fat Mass (lbs): 243.6 lbs Muscle Mass  (lbs): 185.2 lbs Visceral Fat Rating : 54   Other Clinical Data Fasting: No Labs: No Today's Visit #: 20 Starting Date: 05/17/22     ASSESSMENT AND PLAN:  Diet: Jacob Hunt is currently in the action stage of change. As such, his goal is to continue with weight loss efforts. He has agreed to Category 4 Plan and keeping a food journal and adhering to recommended goals of 1800 calories and 120-150 grams of protein.  Exercise: Jacob Hunt has been instructed to try a geriatric exercise plan and that some exercise is better than none for weight loss and overall health benefits.   Behavior Modification:  We discussed the following Behavioral Modification Strategies today: increasing lean protein intake, decreasing simple carbohydrates, increasing vegetables, increase H2O intake, increase high fiber foods, no skipping meals, meal planning and cooking strategies, better snacking choices, avoiding temptations, planning for success, and keep a strict food journal. We discussed various medication options to help Jacob Hunt with his weight loss efforts and we both agreed to continue current treatment plan, continue to work on nutritional and behavioral strategies to promote weight loss.  .  Return in about 4 weeks (around 12/21/2023).Jacob Hunt He was informed of the importance of frequent follow up visits to maximize his success with intensive lifestyle modifications for his multiple health conditions.  Attestation Statements:   Reviewed by clinician on day of visit: allergies, medications, problem list, medical history, surgical history, family history, social history, and previous encounter notes.   Time spent on visit including pre-visit chart review and post-visit care and charting was 42 minutes.    Jacob Remmel, Jacob Hunt

## 2023-11-26 ENCOUNTER — Telehealth: Payer: Self-pay

## 2023-11-26 NOTE — Telephone Encounter (Signed)
 Left VM with callback number for patient to receive sleep study results and recommendations.

## 2023-11-26 NOTE — Telephone Encounter (Signed)
-----   Message from Magnus Schuller sent at 11/16/2023 12:41 PM EDT ----- Awanda Lennert, please notify pt of the results and initiate CPAP Auto at 15 - 20 cm as Rx.

## 2023-12-21 ENCOUNTER — Encounter (INDEPENDENT_AMBULATORY_CARE_PROVIDER_SITE_OTHER): Payer: Self-pay | Admitting: Internal Medicine

## 2023-12-21 ENCOUNTER — Ambulatory Visit (INDEPENDENT_AMBULATORY_CARE_PROVIDER_SITE_OTHER): Admitting: Internal Medicine

## 2023-12-21 ENCOUNTER — Other Ambulatory Visit: Payer: Self-pay

## 2023-12-21 VITALS — BP 131/87 | HR 78 | Temp 97.8°F | Ht 66.0 in | Wt >= 6400 oz

## 2023-12-21 DIAGNOSIS — G4733 Obstructive sleep apnea (adult) (pediatric): Secondary | ICD-10-CM | POA: Diagnosis not present

## 2023-12-21 DIAGNOSIS — R7303 Prediabetes: Secondary | ICD-10-CM

## 2023-12-21 DIAGNOSIS — K219 Gastro-esophageal reflux disease without esophagitis: Secondary | ICD-10-CM | POA: Diagnosis not present

## 2023-12-21 DIAGNOSIS — Z6841 Body Mass Index (BMI) 40.0 and over, adult: Secondary | ICD-10-CM

## 2023-12-21 MED ORDER — PANTOPRAZOLE SODIUM 40 MG PO TBEC
40.0000 mg | DELAYED_RELEASE_TABLET | Freq: Every day | ORAL | 0 refills | Status: AC
  Filled 2023-12-21: qty 30, 30d supply, fill #0
  Filled 2024-03-11: qty 30, 30d supply, fill #1

## 2023-12-21 MED ORDER — TIRZEPATIDE-WEIGHT MANAGEMENT 5 MG/0.5ML ~~LOC~~ SOAJ
5.0000 mg | SUBCUTANEOUS | 0 refills | Status: DC
Start: 2023-12-21 — End: 2024-01-18
  Filled 2023-12-21 – 2023-12-22 (×2): qty 2, 28d supply, fill #0

## 2023-12-21 MED ORDER — METFORMIN HCL 500 MG PO TABS
500.0000 mg | ORAL_TABLET | Freq: Two times a day (BID) | ORAL | 0 refills | Status: DC
  Filled 2023-12-21: qty 60, 30d supply, fill #0
  Filled 2024-01-19: qty 60, 30d supply, fill #1

## 2023-12-21 NOTE — Progress Notes (Signed)
 Office: [phone]  /  Fax: [phone]  Weight Summary And Biometrics  Vitals Temp: 97.8 F (36.6 C) BP: 131/87 Pulse Rate: 78 SpO2: 96 %   Anthropometric Measurements Height: 5' 6 (1.676 m) Weight: (!) 432 lb (196 kg) BMI (Calculated): 69.76 Weight at Last Visit: 438 lb Weight Lost Since Last Visit: 6 lb Weight Gained Since Last Visit: 0 lb Starting Weight: 465 lb Total Weight Loss (lbs): 33 lb (15 kg) Peak Weight: 480 lb   Body Composition  Body Fat %: 55.4 % Fat Mass (lbs): 239.8 lbs Muscle Mass (lbs): 183.6 lbs    RMR: 2592  Today's Visit #: 21  Starting Date: 05/17/22   Subjective   Chief Complaint: Obesity  Interval History Discussed the use of AI scribe software for clinical note transcription with the patient, who gave verbal consent to proceed.  History of Present Illness   Jacob Hunt or Jacob Hunt is a 53 year old male with obesity who presents for medical weight management.  Since the last visit, he has lost six pounds. He follows a reduced calorie nutrition plan about fifty to sixty percent of the time and is not tracking calories. He sometimes skips meals and is not exercising, but is more active around the house, which he attributes to taking care of his wife after her surgery. He is currently taking Wegovy  2.4 mg and metformin , one tablet twice a day. He experiences occasional nausea with Wegovy  and sometimes skips meals. He also takes medication for acid reflux daily, although his symptoms vary depending on the type of food consumed.  He underwent a sleep study and was diagnosed with moderate sleep apnea, with pauses in breathing occurring thirty times per hour and oxygen desaturations happening sixty-nine times. He has not yet had a follow-up appointment to discuss the sleep study results or CPAP therapy. He experienced difficulty with the CPAP mask during the study, experiencing panic due to low air pressure.  He reports  occasional high blood pressure and recent back pain, which he associates with increased activity. No history of kidney stones. He feels full when eating and does not experience hunger between meals. He has occasional nausea with Wegovy  and experiences acid reflux depending on food type.  His sister and cousin have undergone gastric bypass surgery. His cousin experienced significant weight loss but had complications following skin removal surgery.       Challenges affecting patient progress: having difficulty preparing healthy meals, having difficulty with meal prep and planning, low volume of physical activity at present , orthopedic problems, medical conditions or chronic pain affecting mobility, and medical comorbidities.    Pharmacotherapy for weight management: He is currently taking Metformin  (off label use for incretin effect and / or insulin  resistance and / or diabetes prevention) with adequate clinical response  and without side effects. and Wegovy  with adequate clinical response  and without side effects..   Assessment and Plan   Treatment Plan For Obesity:  Recommended Dietary Goals  Jacob Hunt is currently in the action stage of change. As such, his goal is to continue weight management plan. He has agreed to: continue current plan  Behavioral Health and Counseling  We discussed the following behavioral modification strategies today: continue to work on maintaining a reduced calorie state, getting the recommended amount of protein, incorporating whole foods, making healthy choices, staying well hydrated and practicing mindfulness when eating..  Additional education and resources provided today: None  Recommended Physical Activity Goals  Jacob Hunt has been  advised to work up to 150 minutes of moderate intensity aerobic activity a week and strengthening exercises 2-3 times per week for cardiovascular health, weight loss maintenance and preservation of muscle mass.   He has agreed to  :  Think about enjoyable ways to increase daily physical activity and overcoming barriers to exercise and Increase physical activity in their day and reduce sedentary time (increase NEAT).  Pharmacotherapy  He has been on Wegovy  since June of last year and has lost 48 pounds which is 9% of total body weight I feel that patient would benefit from treatment with Zepbound.  He has multiple high risk complications including moderate sleep apnea with hypoxia, diastolic heart failure history of coronary artery disease, hypertension, prediabetes, chronic GERD and biomechanical problems involving his joints.  He would need to lose approximately 20 to 30% with Zepbound being a reasonable pharmacological treatment.  He is also a candidate for gastric bypass.  We will see if his insurance covers Zepbound and switch him from Wegovy  2.4 mg to Zepbound 5 mg once a week.  Jacob Hunt would benefit from pharmacotherapy to assist with hunger signals, satiety and cravings. This will reduce obesity-related health risks by inducing weight loss, and help reduce food consumption and adherence to [facility]) . It may also improve QOL by improving self-confidence and reduce the  setbacks associated with metabolic adaptations.  We reviewed medication benefits and side effects.  Associated Conditions Impacted by Obesity Treatment  OSA (obstructive sleep apnea) Assessment & Plan: Study has not been finalized still up for interpretation but based on my review he had an AHI in the 30s with oxygen desaturations.  Symptoms appear to be worse in rem sleep.  He will follow-up with sleep medicine to initiate PAP therapy.  Losing 15% of body weight may reduce AHI.  Patient would benefit from treatment with Zepbound to reduce AHI and improve quality of life.  Orders: -     Tirzepatide-Weight Management; Inject 5 mg into the skin once a week.  Dispense: 2 mL; Refill: 0  Morbid obesity (HCC)- Start BMI 75.1 Date 05/21/22 Assessment &  Plan: See obesity treatment plan  Orders: -     Tirzepatide-Weight Management; Inject 5 mg into the skin once a week.  Dispense: 2 mL; Refill: 0  Pre-diabetes Assessment & Plan: Most recent A1c is  Lab Results  Component Value Date   HGBA1C 5.7 (H) 03/18/2023   HGBA1C 5.6 05/03/2017    Currently on metformin  and Wegovy  for pharmacoprophylaxis.  We will switch to Zepbound 5 mg once a week.  Continue metformin  for synergy and pharmacoprophylaxis.   Orders: -     metFORMIN  HCl; Take 1 tablet (500 mg total) by mouth 2 (two) times daily with a meal.  Dispense: 180 tablet; Refill: 0  Gastroesophageal reflux disease without esophagitis Assessment & Plan: Improved patient advised to reduce use of PPI to as needed.  Counseled on the risks associated with long-term use.  Orders: -     Pantoprazole  Sodium; Take 1 tablet (40 mg total) by mouth daily.  Dispense: 90 tablet; Refill: 0  BMI 70 and over, adult [facility] -     Tirzepatide-Weight Management; Inject 5 mg into the skin once a week.  Dispense: 2 mL; Refill: 0       Objective   Physical Exam:  Blood pressure 131/87, pulse 78, temperature 97.8 F (36.6 C), height 5' 6 (1.676 m), weight (!) 432 lb (196 kg), SpO2 96%. Body mass index is 69.73 kg/m.  General: He is overweight, cooperative, alert, well developed, and in no acute distress. PSYCH: Has normal mood, affect and thought process.   HEENT: EOMI, sclerae are anicteric. Lungs: Normal breathing effort, no conversational dyspnea. Extremities: No edema.  Neurologic: No gross sensory or motor deficits. No tremors or fasciculations noted.    Diagnostic Data Reviewed:  BMET    Component Value Date/Time   NA 140 10/20/2022 1627   K 4.5 10/20/2022 1627   CL 100 10/20/2022 1627   CO2 21 10/20/2022 1627   GLUCOSE 101 (H) 10/20/2022 1627   GLUCOSE 118 (H) 03/15/2017 1139   BUN 14 10/20/2022 1627   CREATININE 0.87 10/20/2022 1627   CALCIUM  9.4 10/20/2022 1627   GFRNONAA  103 04/08/2020 0747   GFRAA 119 04/08/2020 0747   Lab Results  Component Value Date   HGBA1C 5.7 (H) 03/18/2023   HGBA1C 5.6 05/03/2017   Lab Results  Component Value Date   INSULIN  34.6 (H) 05/07/2022   Lab Results  Component Value Date   TSH 2.090 05/07/2022   CBC    Component Value Date/Time   WBC 7.4 05/07/2022 0911   WBC 8.2 03/16/2017 0132   RBC 4.78 05/07/2022 0911   RBC 4.56 03/16/2017 0132   HGB 14.2 05/07/2022 0911   HCT 42.5 05/07/2022 0911   PLT 252 05/07/2022 0911   MCV 89 05/07/2022 0911   MCH 29.7 05/07/2022 0911   MCH 30.3 03/16/2017 0132   MCHC 33.4 05/07/2022 0911   MCHC 34.1 03/16/2017 0132   RDW 13.0 05/07/2022 0911   Iron Studies No results found for: IRON, TIBC, FERRITIN, IRONPCTSAT Lipid Panel     Component Value Date/Time   CHOL 135 05/07/2022 0911   TRIG 108 05/07/2022 0911   HDL 44 05/07/2022 0911   CHOLHDL 3.1 05/07/2022 0911   CHOLHDL 5.2 03/16/2017 0132   VLDL 47 (H) 03/16/2017 0132   LDLCALC 71 05/07/2022 0911   Hepatic Function Panel     Component Value Date/Time   PROT 6.3 10/20/2022 1627   ALBUMIN 4.1 10/20/2022 1627   AST 21 10/20/2022 1627   ALT 26 10/20/2022 1627   ALKPHOS 107 10/20/2022 1627   BILITOT 0.7 10/20/2022 1627   BILIDIR 0.13 12/09/2021 1458      Component Value Date/Time   TSH 2.090 05/07/2022 0911   Nutritional Lab Results  Component Value Date   VD25OH 43.8 03/18/2023   VD25OH 26.4 (L) 10/20/2022   VD25OH 32.4 05/07/2022    Medications: Outpatient Encounter Medications as of 12/21/2023  Medication Sig   aspirin  EC 81 MG tablet Take 1 tablet (81 mg total) by mouth daily.   atorvastatin  (LIPITOR ) 80 MG tablet Take 1 tablet (80 mg total) by mouth daily at 6 PM.   Cholecalciferol 125 MCG (5000 UT) capsule Take 5,000 Units by mouth daily.   furosemide  (LASIX ) 40 MG tablet Take 1 tablet (40 mg total) by mouth daily.   losartan  (COZAAR ) 50 MG tablet Take 1 tablet (50 mg total) by mouth daily.    metolazone  (ZAROXOLYN ) 2.5 MG tablet Take one tablet once a week 30 minutes before you take the Furosemide  40mg    nitroGLYCERIN  (NITROSTAT ) 0.4 MG SL tablet Place 1 tablet (0.4 mg total) under the tongue every 5 (five) minutes x 3 doses as needed for chest pain.   omega-3 acid ethyl esters (LOVAZA ) 1 g capsule Take 2 capsules (2 g total) by mouth daily.   Psyllium 400 MG CAPS Take 400 mg by mouth daily.  tirzepatide (ZEPBOUND) 5 MG/0.5ML Pen Inject 5 mg into the skin once a week.   [DISCONTINUED] metFORMIN  (GLUCOPHAGE ) 500 MG tablet Take 1 tablet (500 mg total) by mouth 2 (two) times daily with a meal.   [DISCONTINUED] pantoprazole  (PROTONIX ) 40 MG tablet Take 1 tablet (40 mg total) by mouth daily.   [DISCONTINUED] Semaglutide -Weight Management 2.4 MG/0.75ML SOAJ Inject 2.4 mg into the skin once a week.   metFORMIN  (GLUCOPHAGE ) 500 MG tablet Take 1 tablet (500 mg total) by mouth 2 (two) times daily with a meal.   pantoprazole  (PROTONIX ) 40 MG tablet Take 1 tablet (40 mg total) by mouth daily.   No facility-administered encounter medications on file as of 12/21/2023.     Follow-Up   Return in about 4 weeks (around 01/18/2024) for For Weight Mangement with Dr. Allie Area.Jacob Hunt He was informed of the importance of frequent follow up visits to maximize his success with intensive lifestyle modifications for his multiple health conditions.  Attestation Statement   Reviewed by clinician on day of visit: allergies, medications, problem list, medical history, surgical history, family history, social history, and previous encounter notes.     Ladd Picker, MD

## 2023-12-22 ENCOUNTER — Other Ambulatory Visit: Payer: Self-pay

## 2023-12-22 ENCOUNTER — Telehealth (INDEPENDENT_AMBULATORY_CARE_PROVIDER_SITE_OTHER): Payer: Self-pay | Admitting: *Deleted

## 2023-12-22 DIAGNOSIS — G4733 Obstructive sleep apnea (adult) (pediatric): Secondary | ICD-10-CM | POA: Insufficient documentation

## 2023-12-22 NOTE — Assessment & Plan Note (Signed)
 Most recent A1c is  Lab Results  Component Value Date   HGBA1C 5.7 (H) 03/18/2023   HGBA1C 5.6 05/03/2017    Currently on metformin  and Wegovy  for pharmacoprophylaxis.  We will switch to Zepbound 5 mg once a week.  Continue metformin  for synergy and pharmacoprophylaxis.

## 2023-12-22 NOTE — Telephone Encounter (Signed)
 Medication(Zepbound-5 mg) has been sent to plan for prior approval, waiting for response.

## 2023-12-22 NOTE — Assessment & Plan Note (Signed)
 Improved patient advised to reduce use of PPI to as needed.  Counseled on the risks associated with long-term use.

## 2023-12-22 NOTE — Assessment & Plan Note (Signed)
 See obesity treatment plan

## 2023-12-22 NOTE — Assessment & Plan Note (Signed)
 Study has not been finalized still up for interpretation but based on my review he had an AHI in the 30s with oxygen desaturations.  Symptoms appear to be worse in rem sleep.  He will follow-up with sleep medicine to initiate PAP therapy.  Losing 15% of body weight may reduce AHI.  Patient would benefit from treatment with Zepbound to reduce AHI and improve quality of life.

## 2023-12-28 ENCOUNTER — Telehealth: Payer: Self-pay

## 2023-12-28 ENCOUNTER — Encounter (INDEPENDENT_AMBULATORY_CARE_PROVIDER_SITE_OTHER): Payer: Self-pay

## 2023-12-28 NOTE — Telephone Encounter (Signed)
-----   Message from Magnus Schuller sent at 11/16/2023 12:41 PM EDT ----- Awanda Lennert, please notify pt of the results and initiate CPAP Auto at 15 - 20 cm as Rx.

## 2023-12-28 NOTE — Telephone Encounter (Signed)
 Left VM with callback number for patient to receive Titration results and recommendations.

## 2024-01-18 ENCOUNTER — Other Ambulatory Visit: Payer: Self-pay

## 2024-01-18 ENCOUNTER — Encounter (INDEPENDENT_AMBULATORY_CARE_PROVIDER_SITE_OTHER): Payer: Self-pay | Admitting: Internal Medicine

## 2024-01-18 ENCOUNTER — Ambulatory Visit (INDEPENDENT_AMBULATORY_CARE_PROVIDER_SITE_OTHER): Admitting: Internal Medicine

## 2024-01-18 VITALS — BP 118/80 | HR 77 | Temp 97.6°F | Ht 66.0 in | Wt >= 6400 oz

## 2024-01-18 DIAGNOSIS — G4733 Obstructive sleep apnea (adult) (pediatric): Secondary | ICD-10-CM

## 2024-01-18 DIAGNOSIS — I10 Essential (primary) hypertension: Secondary | ICD-10-CM | POA: Diagnosis not present

## 2024-01-18 DIAGNOSIS — R7303 Prediabetes: Secondary | ICD-10-CM

## 2024-01-18 DIAGNOSIS — Z6841 Body Mass Index (BMI) 40.0 and over, adult: Secondary | ICD-10-CM

## 2024-01-18 MED ORDER — TIRZEPATIDE-WEIGHT MANAGEMENT 10 MG/0.5ML ~~LOC~~ SOAJ
10.0000 mg | SUBCUTANEOUS | 0 refills | Status: DC
  Filled 2024-01-18: qty 2, 28d supply, fill #0

## 2024-01-18 NOTE — Progress Notes (Signed)
 Office: [phone]  /  Fax: [phone]  Weight Summary and Body Composition Analysis (BIA)  Vitals Temp: 97.6 F (36.4 C) BP: 118/80 Pulse Rate: 77 SpO2: 97 %   Anthropometric Measurements Height: 5' 6 (1.676 m) Weight: (!) 436 lb (197.8 kg) Jacob (Calculated): 70.41 Weight at Last Visit: 432 lb Weight Lost Since Last Visit: 0 lb Weight Gained Since Last Visit: 4 lb Starting Weight: 465 lb Total Weight Loss (lbs): 29 lb (13.2 kg) Peak Weight: 480 lb   Body Composition  Body Fat %: 56.7 % Fat Mass (lbs): 247.4 lbs Muscle Mass (lbs): 179.6 lbs Visceral Fat Rating : 55    RMR: 2592  Today's Visit #: 22  Starting Date: 05/17/22   Subjective   Chief Complaint: Obesity  Interval History Discussed the use of AI scribe software for clinical note transcription with the patient, who gave verbal consent to proceed.  History of Present Illness   Jacob Hunt or Jacob Hunt is a 53 year old male with obesity and sleep apnea who presents for medication adjustment and weight management.  He is transitioning from Wegovy  to a new medication regimen, currently on a 5 mg dose for four weeks without significant side effects like nausea or vomiting. However, the current dosage has not suppressed his appetite as effectively as Wegovy  did. He has lost approximately 8% of his body weight on the medication.  He eats more meals since his partner requires three meals a day, occasionally skipping lunch, especially on weekends when busy with yard work. He feels satisfied after meals and notes a change in eating habits, no longer feeling the need to finish all his food. He has been eating out more frequently due to his partner's recent surgery, leading to some poor dietary choices.  He engages in physical activity about twice a week, primarily through yard work lasting around 30 minutes each session. He has considered setting up a home gym and has access to a nearby [employer],  although he has not resumed regular gym visits since the COVID-19 pandemic.  He has a history of sleep apnea, with a previous sleep study indicating oxygen level drops. Financial constraints have delayed further evaluation and acquisition of a CPAP machine.  He has been exploring dietary options to support weight management, including protein smoothies and high-protein snacks.        Challenges affecting patient progress: multiple competing priorities, low volume of physical activity at present , orthopedic problems, medical conditions or chronic pain affecting mobility, medical comorbidities, and recent lapse in weight management plan due to work, travel, illness or family circumstances.    Pharmacotherapy for weight management: He is currently taking Zepbound  with adequate clinical response  and without side effects..   Assessment and Plan   Treatment Plan For Obesity:  Recommended Dietary Goals  Jacob Hunt is currently in the action stage of change. As such, his goal is to continue weight management plan. He has agreed to: continue current plan  Behavioral Health and Counseling  We discussed the following behavioral modification strategies today: increasing lean protein intake to established goals, decreasing simple carbohydrates , increasing vegetables, increasing fiber rich foods, avoiding skipping meals, increasing water intake , and work on meal planning and preparation.  Additional education and resources provided today: Handout on how to make protein smoothies  Recommended Physical Activity Goals  Jacob Hunt has been advised to work up to 150 minutes of moderate intensity aerobic activity a week and strengthening exercises 2-3 times per  week for cardiovascular health, weight loss maintenance and preservation of muscle mass.   He has agreed to :  Think about enjoyable ways to increase daily physical activity and overcoming barriers to exercise and Increase physical activity in their  day and reduce sedentary time (increase NEAT).  Medical Interventions and Pharmacotherapy  We discussed various medication options to help Jacob Hunt with his weight loss efforts and we both agreed to : Increase Zepbound  to 10 mg once a week  Associated Conditions Impacted by Obesity Treatment  Assessment & Plan Morbid obesity (HCC)- Start Jacob 75.1 Date 05/21/22 He weighs 480 pounds and has lost approximately 29 pounds or 8% of his weight with Wegovy .  He is now on Zepbound  at a lower dose medication is currently being adjusted. Plans to increase the medication dose to enhance weight loss. Aware of the importance of maintaining a minimum caloric intake to support metabolism and is confident about improving dietary habits. Discussed the potential for gastric bypass surgery in the future if weight loss goals are not met through current measures. Emphasized the importance of addressing metabolism through physical activity to activate muscle mass and improve energy expenditure. - Increase Zepbound  dose to 10 mg, skipping the 7.5 mg dose as he had been on Wegovy  2.4 mg. - Encourage dietary changes, including protein smoothies with whey protein, fruits, and protein milk. - Discuss potential gastric bypass surgery if weight loss goals are not met. - Encourage physical activity, including yard work and Jacob Hunt. - Reassess in 4 weeks. OSA (obstructive sleep apnea) He experiences oxygen desaturation during sleep. Completed a sleep study but has not yet obtained a CPAP machine due to financial constraints. Emphasized the importance of treatment to prevent complications such as decreased energy levels and potential life-threatening events. Discussed the possibility of using a used machine or his wife's old machine if compatible. - Encourage obtaining a CPAP machine, possibly considering a used machine or using his wife's old machine if compatible.     Jacob Hunt Jacob Hunt)  Essential  hypertension Blood pressure is well-controlled.  On losartan  50 mg without adverse effects.    Continue with weight loss therapy. Losing 10% may improve blood pressure control. Monitor for symptoms of orthostasis while losing weight. Continue current regimen and home monitoring for a goal blood pressure of 120/80.   Pre-diabetes Most recent A1c is  Lab Results  Component Value Date   HGBA1C 5.7 (H) 03/18/2023   HGBA1C 5.6 05/03/2017    Currently on metformin  and Zepbound  for pharmacoprophylaxis.  Zepbound  will be increased to 10 mg once a week         Objective   Physical Exam:  Blood pressure 118/80, pulse 77, temperature 97.6 F (36.4 C), height 5' 6 (1.676 m), weight (!) 436 lb (197.8 kg), SpO2 97%. Body mass index is 70.37 kg/m.  General: He is overweight, cooperative, alert, well developed, and in no acute distress. PSYCH: Has normal mood, affect and thought process.   HEENT: EOMI, sclerae are anicteric. Lungs: Normal breathing effort, no conversational dyspnea. Extremities: No edema.  Neurologic: No gross sensory or motor deficits. No tremors or fasciculations noted.    Diagnostic Data Reviewed:  BMET    Component Value Date/Time   NA 140 10/20/2022 1627   K 4.5 10/20/2022 1627   CL 100 10/20/2022 1627   CO2 21 10/20/2022 1627   GLUCOSE 101 (H) 10/20/2022 1627   GLUCOSE 118 (H) 03/15/2017 1139   BUN 14 10/20/2022 1627  CREATININE 0.87 10/20/2022 1627   CALCIUM  9.4 10/20/2022 1627   GFRNONAA 103 04/08/2020 0747   GFRAA 119 04/08/2020 0747   Lab Results  Component Value Date   HGBA1C 5.7 (H) 03/18/2023   HGBA1C 5.6 05/03/2017   Lab Results  Component Value Date   INSULIN  34.6 (H) 05/07/2022   Lab Results  Component Value Date   TSH 2.090 05/07/2022   CBC    Component Value Date/Time   WBC 7.4 05/07/2022 0911   WBC 8.2 03/16/2017 0132   RBC 4.78 05/07/2022 0911   RBC 4.56 03/16/2017 0132   HGB 14.2 05/07/2022 0911   HCT 42.5 05/07/2022  0911   PLT 252 05/07/2022 0911   MCV 89 05/07/2022 0911   MCH 29.7 05/07/2022 0911   MCH 30.3 03/16/2017 0132   MCHC 33.4 05/07/2022 0911   MCHC 34.1 03/16/2017 0132   RDW 13.0 05/07/2022 0911   Iron Studies No results found for: IRON, TIBC, FERRITIN, IRONPCTSAT Lipid Panel     Component Value Date/Time   CHOL 135 05/07/2022 0911   TRIG 108 05/07/2022 0911   HDL 44 05/07/2022 0911   CHOLHDL 3.1 05/07/2022 0911   CHOLHDL 5.2 03/16/2017 0132   VLDL 47 (H) 03/16/2017 0132   LDLCALC 71 05/07/2022 0911   Hepatic Function Panel     Component Value Date/Time   PROT 6.3 10/20/2022 1627   ALBUMIN 4.1 10/20/2022 1627   AST 21 10/20/2022 1627   ALT 26 10/20/2022 1627   ALKPHOS 107 10/20/2022 1627   BILITOT 0.7 10/20/2022 1627   BILIDIR 0.13 12/09/2021 1458      Component Value Date/Time   TSH 2.090 05/07/2022 0911   Nutritional Lab Results  Component Value Date   VD25OH 43.8 03/18/2023   VD25OH 26.4 (L) 10/20/2022   VD25OH 32.4 05/07/2022    Medications: Outpatient Encounter Medications as of 01/18/2024  Medication Sig   aspirin  EC 81 MG tablet Take 1 tablet (81 mg total) by mouth daily.   atorvastatin  (LIPITOR ) 80 MG tablet Take 1 tablet (80 mg total) by mouth daily at 6 PM.   Cholecalciferol 125 MCG (5000 UT) capsule Take 5,000 Units by mouth daily.   furosemide  (LASIX ) 40 MG tablet Take 1 tablet (40 mg total) by mouth daily.   losartan  (COZAAR ) 50 MG tablet Take 1 tablet (50 mg total) by mouth daily.   metFORMIN  (GLUCOPHAGE ) 500 MG tablet Take 1 tablet (500 mg total) by mouth 2 (two) times daily with a meal.   metolazone  (ZAROXOLYN ) 2.5 MG tablet Take one tablet once a week 30 minutes before you take the Furosemide  40mg    nitroGLYCERIN  (NITROSTAT ) 0.4 MG SL tablet Place 1 tablet (0.4 mg total) under the tongue every 5 (five) minutes x 3 doses as needed for chest pain.   omega-3 acid ethyl esters (LOVAZA ) 1 g capsule Take 2 capsules (2 g total) by mouth daily.    pantoprazole  (PROTONIX ) 40 MG tablet Take 1 tablet (40 mg total) by mouth daily.   Psyllium 400 MG CAPS Take 400 mg by mouth daily.   tirzepatide  (ZEPBOUND ) 10 MG/0.5ML Pen Inject 10 mg into the skin once a week.   [DISCONTINUED] tirzepatide  (ZEPBOUND ) 5 MG/0.5ML Pen Inject 5 mg into the skin once a week.   No facility-administered encounter medications on file as of 01/18/2024.     Follow-Up   Return in about 4 weeks (around 02/15/2024) for For Weight Mangement with Dr. Francyne.SABRA He was informed of the importance of frequent  follow up visits to maximize his success with intensive lifestyle modifications for his multiple health conditions.  Attestation Statement   Reviewed by clinician on day of visit: allergies, medications, problem list, medical history, surgical history, family history, social history, and previous encounter notes.     Lucas Parker, MD

## 2024-01-18 NOTE — Assessment & Plan Note (Signed)
 Most recent A1c is  Lab Results  Component Value Date   HGBA1C 5.7 (H) 03/18/2023   HGBA1C 5.6 05/03/2017    Currently on metformin  and Zepbound  for pharmacoprophylaxis.  Zepbound  will be increased to 10 mg once a week

## 2024-01-18 NOTE — Assessment & Plan Note (Signed)
 Blood pressure is well-controlled.  On losartan  50 mg without adverse effects.    Continue with weight loss therapy. Losing 10% may improve blood pressure control. Monitor for symptoms of orthostasis while losing weight. Continue current regimen and home monitoring for a goal blood pressure of 120/80.

## 2024-01-18 NOTE — Assessment & Plan Note (Signed)
 He experiences oxygen desaturation during sleep. Completed a sleep study but has not yet obtained a CPAP machine due to financial constraints. Emphasized the importance of treatment to prevent complications such as decreased energy levels and potential life-threatening events. Discussed the possibility of using a used machine or his wife's old machine if compatible. - Encourage obtaining a CPAP machine, possibly considering a used machine or using his wife's old machine if compatible.

## 2024-01-18 NOTE — Assessment & Plan Note (Signed)
 He weighs 480 pounds and has lost approximately 29 pounds or 8% of his weight with Wegovy .  He is now on Zepbound  at a lower dose medication is currently being adjusted. Plans to increase the medication dose to enhance weight loss. Aware of the importance of maintaining a minimum caloric intake to support metabolism and is confident about improving dietary habits. Discussed the potential for gastric bypass surgery in the future if weight loss goals are not met through current measures. Emphasized the importance of addressing metabolism through physical activity to activate muscle mass and improve energy expenditure. - Increase Zepbound  dose to 10 mg, skipping the 7.5 mg dose as he had been on Wegovy  2.4 mg. - Encourage dietary changes, including protein smoothies with whey protein, fruits, and protein milk. - Discuss potential gastric bypass surgery if weight loss goals are not met. - Encourage physical activity, including yard work and [employer]. - Reassess in 4 weeks.

## 2024-01-31 ENCOUNTER — Telehealth: Payer: Self-pay | Admitting: Cardiovascular Disease

## 2024-01-31 ENCOUNTER — Telehealth (INDEPENDENT_AMBULATORY_CARE_PROVIDER_SITE_OTHER): Payer: Self-pay | Admitting: Internal Medicine

## 2024-01-31 DIAGNOSIS — I2510 Atherosclerotic heart disease of native coronary artery without angina pectoris: Secondary | ICD-10-CM

## 2024-01-31 DIAGNOSIS — I5032 Chronic diastolic (congestive) heart failure: Secondary | ICD-10-CM

## 2024-01-31 DIAGNOSIS — I10 Essential (primary) hypertension: Secondary | ICD-10-CM

## 2024-01-31 DIAGNOSIS — G4730 Sleep apnea, unspecified: Secondary | ICD-10-CM

## 2024-01-31 MED ORDER — ATORVASTATIN CALCIUM 80 MG PO TABS: 80.0000 mg | ORAL_TABLET | Freq: Every day | ORAL | 1 refills | Status: AC

## 2024-01-31 MED ORDER — LOSARTAN POTASSIUM 50 MG PO TABS
50.0000 mg | ORAL_TABLET | Freq: Every day | ORAL | 1 refills | Status: AC
Start: 2024-01-31 — End: ?

## 2024-01-31 MED ORDER — OMEGA-3-ACID ETHYL ESTERS 1 G PO CAPS: 2.0000 | ORAL_CAPSULE | Freq: Every day | ORAL | 1 refills | Status: AC

## 2024-01-31 NOTE — Telephone Encounter (Signed)
 Patient's wife is calling about the sleep study patient had done.

## 2024-01-31 NOTE — Telephone Encounter (Signed)
 Pt's wife called in stating that the pt started taking Zepbound  around 2 months ago and he is now experiencing back pain right below his shoulder blade. He has been experiencing this for about 2 weeks and he wants to be sure the Zepbound  is not causing this pain. Please follow up with the pt.

## 2024-01-31 NOTE — Telephone Encounter (Signed)
 Left pt a message to call me regarding symptoms

## 2024-01-31 NOTE — Telephone Encounter (Signed)
*  STAT* If patient is at the pharmacy, call can be transferred to refill team.   1. Which medications need to be refilled? (please list name of each medication and dose if known)   losartan  (COZAAR ) 50 MG tablet  atorvastatin  (LIPITOR ) 80 MG tablet  omega-3 acid ethyl esters (LOVAZA ) 1 g capsule    2. Would you like to learn more about the convenience, safety, & potential cost savings by using the [facility]?   3. Are you open to using the [facility] (Type [facility].  ).   4. Which pharmacy/location (including street and city if local pharmacy) is medication to be sent to? [facility] - [place], [place] - [address]    5. Do they need a 30 day or 90 day supply? 30 day

## 2024-02-03 NOTE — Telephone Encounter (Signed)
 The patient has been notified of the result and verbalized understanding.  All questions (if any) were answered. Jacob Hunt, CMA 02/03/2024 4:21 PM    Upon patient request DME selection is [facility]. Patient understands he will be contacted by [facility] to set up his cpap. Patient understands to call if [facility] does not contact him with new setup in a timely manner. Patient understands they will be called once confirmation has been received from [facility]/ that they have received their new machine to schedule 10 week follow up appointment.   [facility] notified of new cpap order  Please add to airview Patient was grateful for the call and thanked me.

## 2024-02-03 NOTE — Telephone Encounter (Signed)
 I left two messages for pt to return my call.

## 2024-02-15 ENCOUNTER — Encounter (INDEPENDENT_AMBULATORY_CARE_PROVIDER_SITE_OTHER): Payer: Self-pay | Admitting: Internal Medicine

## 2024-02-15 ENCOUNTER — Ambulatory Visit (INDEPENDENT_AMBULATORY_CARE_PROVIDER_SITE_OTHER): Admitting: Internal Medicine

## 2024-02-15 ENCOUNTER — Other Ambulatory Visit: Payer: Self-pay

## 2024-02-15 VITALS — BP 133/83 | HR 74 | Temp 97.7°F | Ht 66.0 in | Wt >= 6400 oz

## 2024-02-15 DIAGNOSIS — I10 Essential (primary) hypertension: Secondary | ICD-10-CM

## 2024-02-15 DIAGNOSIS — R7303 Prediabetes: Secondary | ICD-10-CM | POA: Diagnosis not present

## 2024-02-15 DIAGNOSIS — G4733 Obstructive sleep apnea (adult) (pediatric): Secondary | ICD-10-CM

## 2024-02-15 DIAGNOSIS — Z6841 Body Mass Index (BMI) 40.0 and over, adult: Secondary | ICD-10-CM

## 2024-02-15 DIAGNOSIS — Z723 Lack of physical exercise: Secondary | ICD-10-CM | POA: Diagnosis not present

## 2024-02-15 MED ORDER — TIRZEPATIDE-WEIGHT MANAGEMENT 10 MG/0.5ML ~~LOC~~ SOAJ
10.0000 mg | SUBCUTANEOUS | 1 refills | Status: DC
Start: 2024-02-15 — End: 2024-03-28
  Filled 2024-02-15: qty 2, 28d supply, fill #0
  Filled 2024-03-13: qty 2, 28d supply, fill #1

## 2024-02-15 NOTE — Progress Notes (Signed)
 Office: [phone]  /  Fax: [phone]  Weight Summary and Body Composition Analysis (BIA)  Vitals Temp: 97.7 F (36.5 C) BP: 133/83 Pulse Rate: 74 SpO2: 96 %   Anthropometric Measurements Height: 5' 6 (1.676 m) Weight: (!) 433 lb (196.4 kg) BMI (Calculated): 69.92 Weight at Last Visit: 436 lb Weight Lost Since Last Visit: 3 lb Weight Gained Since Last Visit: 0 lb Starting Weight: 465 lb Total Weight Loss (lbs): 32 lb (14.5 kg) Peak Weight: 480 lb   Body Composition  Body Fat %: 57.1 % Fat Mass (lbs): 247.4 lbs Muscle Mass (lbs): 176.8 lbs Visceral Fat Rating : 55    RMR: 2592  Today's Visit #: 23  Starting Date: 05/17/22   Subjective   Chief Complaint: Obesity  Interval History Discussed the use of AI scribe software for clinical note transcription with the patient, who gave verbal consent to proceed.  History of Present Illness   Jacob Hunt or Jacob Hunt is a 53 year old male who presents for medical weight management.  He has lost three more pounds since the last visit and is currently on Mounjaro . He experiences side effects such as gas and burping, similar to those experienced with Wegovy . He is not consistently eating three meals a day, often skipping meals due to a lack of hunger and a busy schedule. He acknowledges that he often does not feel hungry, which he identifies as a problem.  His wife, who recently had surgery, is now more mobile and assists with meal preparation. Previously, he ate out more frequently due to her inability to cook and his own limited cooking skills. He has been using protein shakes, such as Muscle Milk and Fairlife, as meal replacements.  He has been more active, noting that he walks more at work due to parking further away from his tractor. He has considered using a nearby racetrack for biking and walking, as he has access to it and it is a safe environment without traffic. He has bikes at home and used to ride  frequently before moving and building a house.  He recalls a recent incident of dehydration while working outside in high temperatures, which caused back pain. He drank water and felt better afterward. His father was hospitalized for dehydration after the same incident.  He has been on metformin  since June and has a sufficient supply. He also has acid reflux medication but does not take it daily.        Challenges affecting patient progress: low volume of physical activity at present  and restrictive eating at times.    Pharmacotherapy for weight management: He is currently taking Zepbound  with adequate clinical response  and without side effects..   Assessment and Plan   Treatment Plan For Obesity:  Recommended Dietary Goals  Jacob Hunt is currently in the action stage of change. As such, his goal is to continue weight management plan. He has agreed to: follow the Category 4 plan - 1800 kcal per day and continue current plan  Behavioral Health and Counseling  We discussed the following behavioral modification strategies today: increasing lean protein intake to established goals, decreasing simple carbohydrates , increasing vegetables, increasing fiber rich foods, avoiding skipping meals, work on meal planning and preparation, and planning for success.  Additional education and resources provided today: None  Recommended Physical Activity Goals  Jacob Hunt has been advised to work up to 150 minutes of moderate intensity aerobic activity a week and strengthening exercises 2-3 times per week  for cardiovascular health, weight loss maintenance and preservation of muscle mass.   He has agreed to :  Start riding bicycle at near [employer] once a week  Medical Interventions and Pharmacotherapy  We discussed various medication options to help Jacob Hunt with his weight loss efforts and we both agreed to : Adequate clinical response to anti-obesity medication, continue current regimen, do not  recommend further increases in GLP-1 due to adequate clinical response , and restrictive eating  Associated Conditions Impacted by Obesity Treatment  Assessment & Plan Morbid obesity (HCC)- Start BMI 75.1 Date 05/21/22 Obesity management with Zepbound  is ongoing. Weight loss of 32 pounds achieved, but there is concern about muscle loss due to insufficient caloric intake. Current dose of Zepbound  effectively suppresses appetite, potentially leading to inadequate protein intake and muscle loss. - Continue Mounjaro  at current dose and provide a refill. - Emphasize consumption of three meals a day, aiming for 1800 calories, to prevent muscle loss and ensure adequate protein intake. - Incorporate protein shakes or meal replacements if necessary. - Encourage regular physical activity, such as biking and walking, to aid in fat loss and improve overall health. - Focus on hydration, aiming for at least a water bottle every hour when active. - Discussed potential for gastric bypass in diet-resistant cases, though not currently indicated. OSA (obstructive sleep apnea) He experiences oxygen desaturation during sleep. Completed a sleep study but has not yet obtained a CPAP machine due to financial constraints. Emphasized the importance of treatment to prevent complications such as decreased energy levels and potential life-threatening events. Discussed the possibility of using a used machine or his wife's old machine if compatible. - Encourage obtaining a CPAP machine, possibly considering a used machine or using his wife's old machine if compatible.     Essential hypertension Blood pressure is well-controlled.  On losartan  50 mg without adverse effects.    Continue with weight loss therapy. Losing 10% may improve blood pressure control. Monitor for symptoms of orthostasis while losing weight. Continue current regimen and home monitoring for a goal blood pressure of 120/80.   Pre-diabetes Most recent A1c  is  Lab Results  Component Value Date   HGBA1C 5.7 (H) 03/18/2023   HGBA1C 5.6 05/03/2017    Currently on metformin  and Zepbound  for pharmacoprophylaxis.  Continue current regimen  Physically inactive           Objective   Physical Exam:  Blood pressure 133/83, pulse 74, temperature 97.7 F (36.5 C), height 5' 6 (1.676 m), weight (!) 433 lb (196.4 kg), SpO2 96%. Body mass index is 69.89 kg/m.  General: He is overweight, cooperative, alert, well developed, and in no acute distress. PSYCH: Has normal mood, affect and thought process.   HEENT: EOMI, sclerae are anicteric. Lungs: Normal breathing effort, no conversational dyspnea. Extremities: No edema.  Neurologic: No gross sensory or motor deficits. No tremors or fasciculations noted.    Diagnostic Data Reviewed:  BMET    Component Value Date/Time   NA 140 10/20/2022 1627   K 4.5 10/20/2022 1627   CL 100 10/20/2022 1627   CO2 21 10/20/2022 1627   GLUCOSE 101 (H) 10/20/2022 1627   GLUCOSE 118 (H) 03/15/2017 1139   BUN 14 10/20/2022 1627   CREATININE 0.87 10/20/2022 1627   CALCIUM  9.4 10/20/2022 1627   GFRNONAA 103 04/08/2020 0747   GFRAA 119 04/08/2020 0747   Lab Results  Component Value Date   HGBA1C 5.7 (H) 03/18/2023   HGBA1C 5.6 05/03/2017   Lab  Results  Component Value Date   INSULIN  34.6 (H) 05/07/2022   Lab Results  Component Value Date   TSH 2.090 05/07/2022   CBC    Component Value Date/Time   WBC 7.4 05/07/2022 0911   WBC 8.2 03/16/2017 0132   RBC 4.78 05/07/2022 0911   RBC 4.56 03/16/2017 0132   HGB 14.2 05/07/2022 0911   HCT 42.5 05/07/2022 0911   PLT 252 05/07/2022 0911   MCV 89 05/07/2022 0911   MCH 29.7 05/07/2022 0911   MCH 30.3 03/16/2017 0132   MCHC 33.4 05/07/2022 0911   MCHC 34.1 03/16/2017 0132   RDW 13.0 05/07/2022 0911   Iron Studies No results found for: IRON, TIBC, FERRITIN, IRONPCTSAT Lipid Panel     Component Value Date/Time   CHOL 135 05/07/2022  0911   TRIG 108 05/07/2022 0911   HDL 44 05/07/2022 0911   CHOLHDL 3.1 05/07/2022 0911   CHOLHDL 5.2 03/16/2017 0132   VLDL 47 (H) 03/16/2017 0132   LDLCALC 71 05/07/2022 0911   Hepatic Function Panel     Component Value Date/Time   PROT 6.3 10/20/2022 1627   ALBUMIN 4.1 10/20/2022 1627   AST 21 10/20/2022 1627   ALT 26 10/20/2022 1627   ALKPHOS 107 10/20/2022 1627   BILITOT 0.7 10/20/2022 1627   BILIDIR 0.13 12/09/2021 1458      Component Value Date/Time   TSH 2.090 05/07/2022 0911   Nutritional Lab Results  Component Value Date   VD25OH 43.8 03/18/2023   VD25OH 26.4 (L) 10/20/2022   VD25OH 32.4 05/07/2022    Medications: Outpatient Encounter Medications as of 02/15/2024  Medication Sig   aspirin  EC 81 MG tablet Take 1 tablet (81 mg total) by mouth daily.   atorvastatin  (LIPITOR ) 80 MG tablet Take 1 tablet (80 mg total) by mouth daily at 6 PM.   Cholecalciferol 125 MCG (5000 UT) capsule Take 5,000 Units by mouth daily.   furosemide  (LASIX ) 40 MG tablet Take 1 tablet (40 mg total) by mouth daily.   losartan  (COZAAR ) 50 MG tablet Take 1 tablet (50 mg total) by mouth daily.   metFORMIN  (GLUCOPHAGE ) 500 MG tablet Take 1 tablet (500 mg total) by mouth 2 (two) times daily with a meal.   metolazone  (ZAROXOLYN ) 2.5 MG tablet Take one tablet once a week 30 minutes before you take the Furosemide  40mg    nitroGLYCERIN  (NITROSTAT ) 0.4 MG SL tablet Place 1 tablet (0.4 mg total) under the tongue every 5 (five) minutes x 3 doses as needed for chest pain.   omega-3 acid ethyl esters (LOVAZA ) 1 g capsule Take 2 capsules (2 g total) by mouth daily.   pantoprazole  (PROTONIX ) 40 MG tablet Take 1 tablet (40 mg total) by mouth daily.   Psyllium 400 MG CAPS Take 400 mg by mouth daily.   [DISCONTINUED] tirzepatide  (ZEPBOUND ) 10 MG/0.5ML Pen Inject 10 mg into the skin once a week.   tirzepatide  (ZEPBOUND ) 10 MG/0.5ML Pen Inject 10 mg into the skin once a week.   No facility-administered  encounter medications on file as of 02/15/2024.     Follow-Up   Return in about 6 weeks (around 03/28/2024) for For Weight Mangement with Dr. Francyne.[email or web address] He was informed of the importance of frequent follow up visits to maximize his success with intensive lifestyle modifications for his multiple health conditions.  Attestation Statement   Reviewed by clinician on day of visit: allergies, medications, problem list, medical history, surgical history, family history, social history, and previous encounter notes.  Lucas Parker, MD

## 2024-02-16 DIAGNOSIS — Z723 Lack of physical exercise: Secondary | ICD-10-CM | POA: Insufficient documentation

## 2024-02-16 NOTE — Assessment & Plan Note (Signed)
 Most recent A1c is  Lab Results  Component Value Date   HGBA1C 5.7 (H) 03/18/2023   HGBA1C 5.6 05/03/2017    Currently on metformin  and Zepbound  for pharmacoprophylaxis.  Continue current regimen

## 2024-02-16 NOTE — Assessment & Plan Note (Signed)
 He experiences oxygen desaturation during sleep. Completed a sleep study but has not yet obtained a CPAP machine due to financial constraints. Emphasized the importance of treatment to prevent complications such as decreased energy levels and potential life-threatening events. Discussed the possibility of using a used machine or his wife's old machine if compatible. - Encourage obtaining a CPAP machine, possibly considering a used machine or using his wife's old machine if compatible.

## 2024-02-16 NOTE — Assessment & Plan Note (Signed)
 Blood pressure is well-controlled.  On losartan  50 mg without adverse effects.    Continue with weight loss therapy. Losing 10% may improve blood pressure control. Monitor for symptoms of orthostasis while losing weight. Continue current regimen and home monitoring for a goal blood pressure of 120/80.

## 2024-02-16 NOTE — Assessment & Plan Note (Signed)
 Obesity management with Zepbound  is ongoing. Weight loss of 32 pounds achieved, but there is concern about muscle loss due to insufficient caloric intake. Current dose of Zepbound  effectively suppresses appetite, potentially leading to inadequate protein intake and muscle loss. - Continue Mounjaro  at current dose and provide a refill. - Emphasize consumption of three meals a day, aiming for 1800 calories, to prevent muscle loss and ensure adequate protein intake. - Incorporate protein shakes or meal replacements if necessary. - Encourage regular physical activity, such as biking and walking, to aid in fat loss and improve overall health. - Focus on hydration, aiming for at least a water bottle every hour when active. - Discussed potential for gastric bypass in diet-resistant cases, though not currently indicated.

## 2024-03-14 ENCOUNTER — Ambulatory Visit (INDEPENDENT_AMBULATORY_CARE_PROVIDER_SITE_OTHER): Admitting: Internal Medicine

## 2024-03-28 ENCOUNTER — Ambulatory Visit (INDEPENDENT_AMBULATORY_CARE_PROVIDER_SITE_OTHER): Admitting: Internal Medicine

## 2024-03-28 ENCOUNTER — Encounter (INDEPENDENT_AMBULATORY_CARE_PROVIDER_SITE_OTHER): Payer: Self-pay | Admitting: Internal Medicine

## 2024-03-28 ENCOUNTER — Other Ambulatory Visit: Payer: Self-pay

## 2024-03-28 VITALS — BP 136/84 | HR 61 | Temp 97.7°F | Ht 66.0 in | Wt >= 6400 oz

## 2024-03-28 DIAGNOSIS — I2510 Atherosclerotic heart disease of native coronary artery without angina pectoris: Secondary | ICD-10-CM

## 2024-03-28 DIAGNOSIS — I10 Essential (primary) hypertension: Secondary | ICD-10-CM

## 2024-03-28 DIAGNOSIS — G4733 Obstructive sleep apnea (adult) (pediatric): Secondary | ICD-10-CM | POA: Diagnosis not present

## 2024-03-28 DIAGNOSIS — R7303 Prediabetes: Secondary | ICD-10-CM | POA: Diagnosis not present

## 2024-03-28 DIAGNOSIS — Z6841 Body Mass Index (BMI) 40.0 and over, adult: Secondary | ICD-10-CM

## 2024-03-28 MED ORDER — TIRZEPATIDE-WEIGHT MANAGEMENT 10 MG/0.5ML ~~LOC~~ SOAJ
10.0000 mg | SUBCUTANEOUS | 1 refills | Status: DC
  Filled 2024-03-28 – 2024-04-09 (×2): qty 2, 28d supply, fill #0

## 2024-03-28 MED ORDER — METFORMIN HCL 500 MG PO TABS
500.0000 mg | ORAL_TABLET | Freq: Two times a day (BID) | ORAL | 0 refills | Status: AC
  Filled 2024-03-28: qty 60, 30d supply, fill #0

## 2024-03-28 NOTE — Assessment & Plan Note (Signed)
 Stable.  On aspirin , statin, ARB and as needed use of diuretics.  He is due for disease monitoring labs.

## 2024-03-28 NOTE — Assessment & Plan Note (Signed)
 Weight: decrease of 54.2 lb (11.3%) over 1 year, 2 months  Start: 01/01/2023 480 lb 3.2 oz (217.8 kg) (H)  End: 03/28/2024 426 lb (193.2 kg) (H) Obesity with significant weight loss of 54 pounds from a previous weight of 480 pounds in June 2023 to 426 pounds currently. The weight loss is attributed to increased physical activity and dietary changes, including increased protein intake and meal frequency. Jacob Hunt is experiencing improved physical function and reduced pain with weight loss. - Continue current dietary changes with emphasis on protein intake and meal frequency. - Encourage continued physical activity, including bicycling and yard work. - No further increases in GLP-1 recommended due to risk of restrictive eating.  Jacob Hunt continues to work on increasing the frequency of protein intake.

## 2024-03-28 NOTE — Assessment & Plan Note (Signed)
 He experiences oxygen desaturation during sleep. Completed a sleep study but has not yet obtained a CPAP machine due to financial constraints. Emphasized the importance of treatment to prevent complications such as decreased energy levels and potential life-threatening events. Discussed the possibility of using a used machine or his wife's old machine if compatible. - Encourage obtaining a CPAP machine, possibly considering a used machine or using his wife's old machine if compatible.

## 2024-03-28 NOTE — Assessment & Plan Note (Signed)
 Blood pressure is close to goal.  On losartan  50 mg without adverse effects.    Continue with weight loss therapy. Losing 10% may improve blood pressure control. Monitor for symptoms of orthostasis while losing weight. Continue current regimen and home monitoring for a goal blood pressure of 120/80.   Check disease monitoring labs

## 2024-03-28 NOTE — Assessment & Plan Note (Signed)
 Most recent A1c is  Lab Results  Component Value Date   HGBA1C 5.7 (H) 03/18/2023   HGBA1C 5.6 05/03/2017    Currently on metformin  and Zepbound  for pharmacoprophylaxis.  He is due for disease monitoring labs to help him save money I recommend he get these completed to his annual wellness exam which has not been scheduled.  If he is unable to do this we will proceed with checking his labs in a fasting state.

## 2024-03-28 NOTE — Progress Notes (Signed)
 Office: [phone]  /  Fax: [phone]  Weight Summary and Body Composition Analysis (BIA)  Vitals Temp: 97.7 F (36.5 C) BP: 136/84 Pulse Rate: 61 SpO2: 95 %   Anthropometric Measurements Height: 5' 6 (1.676 m) Weight: (!) 426 lb (193.2 kg) BMI (Calculated): 68.79 Weight at Last Visit: 433 lb Weight Lost Since Last Visit: 7 lb Weight Gained Since Last Visit: 0 lb Starting Weight: 165 lb Total Weight Loss (lbs): 39 lb (17.7 kg) Peak Weight: 480 lb   Body Composition  Body Fat %: 55.2 % Fat Mass (lbs): 235.6 lbs Muscle Mass (lbs): 181.8 lbs Visceral Fat Rating : 53    RMR: 2592  Today's Visit #: 23  Starting Date: 05/17/22   Subjective   Chief Complaint: Obesity  Interval History Discussed the use of AI scribe software for clinical note transcription with the patient, who gave verbal consent to proceed.  History of Present Illness Jacob Hunt or Jacob Hunt is a 53 year old male with obesity, sleep apnea, and prediabetes who presents for medical weight management.  He has lost seven pounds since his last visit, attributing this to increased physical activity and dietary changes. He has been more physically active, engaging in yard work and riding his bicycle, which he recently repaired and equipped with a more comfortable seat. He reports that he is able to perform more physical tasks and experiences less pain and fatigue than before.  Regarding his diet, he is trying to consume more protein and has found snack bars to help fill gaps when needed. He occasionally skips meals, particularly breakfast, but is making efforts to eat more regularly. He reports that he has been told he does not eat enough and that skipping meals has been discussed as a possible issue for his weight loss.  His weight has decreased from 480 pounds in June 2023 to 426 pounds, marking a total loss of 54 pounds. He notes a significant improvement in his physical capabilities and  a reduction in back pain, which he attributes to the weight loss.  He is currently taking metformin , one tablet twice a day, and has recently refilled his medications.  Family history includes a cousin who underwent gastric bypass surgery and successfully maintained weight loss, and a sister who had a lap band procedure with variable success. He discusses the challenges of maintaining weight loss and the potential need for skin removal surgery in the future.     Challenges affecting patient progress: low volume of physical activity at present , metabolic adaptations associated with weight loss, and sedentary job.    Pharmacotherapy for weight management: He is currently taking Metformin  (off label use for weight management and / or insulin  resistance and / or diabetes prevention) with adequate clinical response  and without side effects. and Zepbound  with adequate clinical response  and without side effects..   Assessment and Plan   Treatment Plan For Obesity:  Recommended Dietary Goals  [name] is currently in the action stage of change. As such, his goal is to continue weight management plan. He has agreed to: continue current plan  Behavioral Health and Counseling  We discussed the following behavioral modification strategies today: increasing lean protein intake to established goals and avoiding skipping meals.  Additional education and resources provided today: None  Recommended Physical Activity Goals  [name] has been advised to work up to 150 minutes of moderate intensity aerobic activity a week and strengthening exercises 2-3 times per week for cardiovascular health, weight  loss maintenance and preservation of muscle mass.  He has agreed to :  Think about enjoyable ways to increase daily physical activity and overcoming barriers to exercise, Increase physical activity in their day and reduce sedentary time (increase NEAT)., and he has prepared his bicycle to possibly begin  riding bicycle at a nearby racetr[place]  Medical Interventions and Pharmacotherapy  We discussed various medication options to help [name] with his weight loss efforts and we both agreed to : Adequate clinical response to anti-obesity medication, continue current regimen and no further increases recommended on Zepbound  due to risk of restrictive eating  Associated Conditions Impacted by Obesity Treatment  Assessment & Plan Morbid obesity (HCC)- Start BMI 75.1 Date 05/21/22 Weight: decrease of 54.2 lb (11.3%) over 1 year, 2 months  Start: 01/01/2023 480 lb 3.2 oz (217.8 kg) (H)  End: 03/28/2024 426 lb (193.2 kg) (H) Obesity with significant weight loss of 54 pounds from a previous weight of 480 pounds in June 2023 to 426 pounds currently. The weight loss is attributed to increased physical activity and dietary changes, including increased protein intake and meal frequency. He is experiencing improved physical function and reduced pain with weight loss. - Continue current dietary changes with emphasis on protein intake and meal frequency. - Encourage continued physical activity, including bicycling and yard work. - No further increases in GLP-1 recommended due to risk of restrictive eating.  He continues to work on increasing the frequency of protein intake. OSA (obstructive sleep apnea) He experiences oxygen desaturation during sleep. Completed a sleep study but has not yet obtained a CPAP machine due to financial constraints. Emphasized the importance of treatment to prevent complications such as decreased energy levels and potential life-threatening events. Discussed the possibility of using a used machine or his wife's old machine if compatible. - Encourage obtaining a CPAP machine, possibly considering a used machine or using his wife's old machine if compatible.     Pre-diabetes Most recent A1c is  Lab Results  Component Value Date   HGBA1C 5.7 (H) 03/18/2023   HGBA1C 5.6 05/03/2017     Currently on metformin  and Zepbound  for pharmacoprophylaxis.  He is due for disease monitoring labs to help him save money I recommend he get these completed to his annual wellness exam which has not been scheduled.  If he is unable to do this we will proceed with checking his labs in a fasting state.  Essential hypertension Blood pressure is close to goal.  On losartan  50 mg without adverse effects.    Continue with weight loss therapy. Losing 10% may improve blood pressure control. Monitor for symptoms of orthostasis while losing weight. Continue current regimen and home monitoring for a goal blood pressure of 120/80.   Check disease monitoring labs  Coronary artery disease involving native coronary artery of native heart without angina pectoris Stable.  On aspirin , statin, ARB and as needed use of diuretics.  He is due for disease monitoring labs.        Objective   Physical Exam:  Blood pressure 136/84, pulse 61, temperature 97.7 F (36.5 C), height 5' 6 (1.676 m), weight (!) 426 lb (193.2 kg), SpO2 95%. Body mass index is 68.76 kg/m.  General: He is overweight, cooperative, alert, well developed, and in no acute distress. PSYCH: Has normal mood, affect and thought process.   HEENT: EOMI, sclerae are anicteric. Lungs: Normal breathing effort, no conversational dyspnea. Extremities: No edema.  Neurologic: No gross sensory or motor deficits. No tremors or  fasciculations noted.    Diagnostic Data Reviewed:  BMET    Component Value Date/Time   NA 140 10/20/2022 1627   K 4.5 10/20/2022 1627   CL 100 10/20/2022 1627   CO2 21 10/20/2022 1627   GLUCOSE 101 (H) 10/20/2022 1627   GLUCOSE 118 (H) 03/15/2017 1139   BUN 14 10/20/2022 1627   CREATININE 0.87 10/20/2022 1627   CALCIUM  9.4 10/20/2022 1627   GFRNONAA 103 04/08/2020 0747   GFRAA 119 04/08/2020 0747   Lab Results  Component Value Date   HGBA1C 5.7 (H) 03/18/2023   HGBA1C 5.6 05/03/2017   Lab Results   Component Value Date   INSULIN  34.6 (H) 05/07/2022   Lab Results  Component Value Date   TSH 2.090 05/07/2022   CBC    Component Value Date/Time   WBC 7.4 05/07/2022 0911   WBC 8.2 03/16/2017 0132   RBC 4.78 05/07/2022 0911   RBC 4.56 03/16/2017 0132   HGB 14.2 05/07/2022 0911   HCT 42.5 05/07/2022 0911   PLT 252 05/07/2022 0911   MCV 89 05/07/2022 0911   MCH 29.7 05/07/2022 0911   MCH 30.3 03/16/2017 0132   MCHC 33.4 05/07/2022 0911   MCHC 34.1 03/16/2017 0132   RDW 13.0 05/07/2022 0911   Iron Studies No results found for: IRON, TIBC, FERRITIN, IRONPCTSAT Lipid Panel     Component Value Date/Time   CHOL 135 05/07/2022 0911   TRIG 108 05/07/2022 0911   HDL 44 05/07/2022 0911   CHOLHDL 3.1 05/07/2022 0911   CHOLHDL 5.2 03/16/2017 0132   VLDL 47 (H) 03/16/2017 0132   LDLCALC 71 05/07/2022 0911   Hepatic Function Panel     Component Value Date/Time   PROT 6.3 10/20/2022 1627   ALBUMIN 4.1 10/20/2022 1627   AST 21 10/20/2022 1627   ALT 26 10/20/2022 1627   ALKPHOS 107 10/20/2022 1627   BILITOT 0.7 10/20/2022 1627   BILIDIR 0.13 12/09/2021 1458      Component Value Date/Time   TSH 2.090 05/07/2022 0911   Nutritional Lab Results  Component Value Date   VD25OH 43.8 03/18/2023   VD25OH 26.4 (L) 10/20/2022   VD25OH 32.4 05/07/2022    Medications: Outpatient Encounter Medications as of 03/28/2024  Medication Sig   aspirin  EC 81 MG tablet Take 1 tablet (81 mg total) by mouth daily.   atorvastatin  (LIPITOR ) 80 MG tablet Take 1 tablet (80 mg total) by mouth daily at 6 PM.   Cholecalciferol 125 MCG (5000 UT) capsule Take 5,000 Units by mouth daily.   furosemide  (LASIX ) 40 MG tablet Take 1 tablet (40 mg total) by mouth daily.   losartan  (COZAAR ) 50 MG tablet Take 1 tablet (50 mg total) by mouth daily.   metolazone  (ZAROXOLYN ) 2.5 MG tablet Take one tablet once a week 30 minutes before you take the Furosemide  40mg    nitroGLYCERIN  (NITROSTAT ) 0.4 MG SL  tablet Place 1 tablet (0.4 mg total) under the tongue every 5 (five) minutes x 3 doses as needed for chest pain.   omega-3 acid ethyl esters (LOVAZA ) 1 g capsule Take 2 capsules (2 g total) by mouth daily.   pantoprazole  (PROTONIX ) 40 MG tablet Take 1 tablet (40 mg total) by mouth daily.   Psyllium 400 MG CAPS Take 400 mg by mouth daily.   [DISCONTINUED] metFORMIN  (GLUCOPHAGE ) 500 MG tablet Take 1 tablet (500 mg total) by mouth 2 (two) times daily with a meal.   [DISCONTINUED] tirzepatide  (ZEPBOUND ) 10 MG/0.5ML Pen Inject 10  mg into the skin once a week.   metFORMIN  (GLUCOPHAGE ) 500 MG tablet Take 1 tablet (500 mg total) by mouth 2 (two) times daily with a meal.   tirzepatide  (ZEPBOUND ) 10 MG/0.5ML Pen Inject 10 mg into the skin once a week.   No facility-administered encounter medications on file as of 03/28/2024.     Follow-Up   Return in about 4 weeks (around 04/25/2024) for For Weight Mangement with Dr. Francyne.SABRA He was informed of the importance of frequent follow up visits to maximize his success with intensive lifestyle modifications for his multiple health conditions.  Attestation Statement   Reviewed by clinician on day of visit: allergies, medications, problem list, medical history, surgical history, family history, social history, and previous encounter notes.     Lucas Francyne, MD

## 2024-03-29 ENCOUNTER — Other Ambulatory Visit: Payer: Self-pay

## 2024-04-09 ENCOUNTER — Other Ambulatory Visit: Payer: Self-pay

## 2024-04-25 ENCOUNTER — Encounter (INDEPENDENT_AMBULATORY_CARE_PROVIDER_SITE_OTHER): Payer: Self-pay | Admitting: Internal Medicine

## 2024-04-25 ENCOUNTER — Ambulatory Visit (INDEPENDENT_AMBULATORY_CARE_PROVIDER_SITE_OTHER): Admitting: Internal Medicine

## 2024-04-25 ENCOUNTER — Other Ambulatory Visit: Payer: Self-pay

## 2024-04-25 DIAGNOSIS — R7303 Prediabetes: Secondary | ICD-10-CM | POA: Diagnosis not present

## 2024-04-25 DIAGNOSIS — Z6841 Body Mass Index (BMI) 40.0 and over, adult: Secondary | ICD-10-CM

## 2024-04-25 DIAGNOSIS — I10 Essential (primary) hypertension: Secondary | ICD-10-CM

## 2024-04-25 DIAGNOSIS — G4733 Obstructive sleep apnea (adult) (pediatric): Secondary | ICD-10-CM | POA: Diagnosis not present

## 2024-04-25 MED ORDER — ZEPBOUND 12.5 MG/0.5ML ~~LOC~~ SOAJ
12.5000 mg | SUBCUTANEOUS | 0 refills | Status: DC
  Filled 2024-04-25 – 2024-05-19 (×2): qty 2, 28d supply, fill #0

## 2024-04-25 NOTE — Assessment & Plan Note (Signed)
 He experiences oxygen desaturation during sleep. Completed a sleep study but has not yet obtained a CPAP machine due to financial constraints. Emphasized the importance of treatment to prevent complications such as decreased energy levels and potential life-threatening events. Discussed the possibility of using a used machine or his wife's old machine if compatible. - Encourage obtaining a CPAP machine, possibly considering a used machine or using his wife's old machine if compatible.

## 2024-04-25 NOTE — Assessment & Plan Note (Signed)
 Patient has not gone to the lab for blood work.  He is currently on metformin  and Zepbound  for pharmacoprophylaxis.  He will go to the lab at his earliest convenience continue current weight management strategy

## 2024-04-25 NOTE — Assessment & Plan Note (Signed)
 Weight gain of 4 pounds Discussed tracking and journaling today with a target of 2000 cal/day with a protein goal between 120 150 g daily Increase Zepbound  to 12.5 mg once a day Continue metformin  for synergy and diabetes prevention Again emphasized the importance of physical activity he has a high body fat percentage and benefits from engaging in moderate intensity combined exercise.

## 2024-04-25 NOTE — Assessment & Plan Note (Signed)
 Blood pressure is at goal on losartan  50 mg without adverse effects.    Continue with weight loss therapy. Losing 10% may improve blood pressure control. Monitor for symptoms of orthostasis while losing weight. Continue current regimen and home monitoring for a goal blood pressure of 120/80.   Check disease monitoring labs

## 2024-04-25 NOTE — Progress Notes (Signed)
 Office: [phone]  /  Fax: [phone]  Weight Summary and Body Composition Analysis (BIA)  Vitals Temp: 97.7 F (36.5 C) BP: 126/73 Pulse Rate: 75 SpO2: 98 %   Anthropometric Measurements Height: 5' 6 (1.676 m) Weight: (!) 430 lb (195 kg) BMI (Calculated): 69.44 Weight at Last Visit: 426lb Weight Lost Since Last Visit: 0lb Weight Gained Since Last Visit: 4lb Starting Weight: 465lb Total Weight Loss (lbs): 35 lb (15.9 kg) Peak Weight: 480lb   Body Composition  Body Fat %: 56.1 % Fat Mass (lbs): 241.4 lbs Muscle Mass (lbs): 179.6 lbs Visceral Fat Rating : 52    RMR: 2592  Today's Visit #: 24  Starting Date: 05/17/22   Subjective   Chief Complaint: Obesity  Interval History Discussed the use of AI scribe software for clinical note transcription with the patient, who gave verbal consent to proceed.  History of Present Illness      Challenges affecting patient progress: low volume of physical activity at present , inadequate response to nutritional and behavioral strategies, and inadequate response to pharmacotherapy.    Pharmacotherapy for weight management: He is currently taking Metformin  (off label use for weight management and / or insulin  resistance and / or diabetes prevention) with adequate clinical response  and without side effects. and Zepbound  with adequate clinical response  and without side effects..   Assessment and Plan   Treatment Plan For Obesity:  Recommended Dietary Goals  [name] is currently in the action stage of change. As such, his goal is to continue weight management plan. He has agreed to: keep a food journal with a target of  2000 calories per day and 120 - 150 grams of protein per day  or 40 - 50 grams per meal.  Behavioral Health and Counseling  We discussed the following behavioral modification strategies today: increasing lean protein intake to established goals, avoiding skipping meals, increasing water intake ,  work on meal planning and preparation, work on tracking and journaling calories using tracking application, decreasing eating out or consumption of processed foods, and making healthy choices when eating convenient foods, and practice mindfulness eating and understand the difference between hunger signals and cravings.  Additional education and resources provided today: Personal weight loss goals  Recommended Physical Activity Goals  Jacob Hunt has been advised to work up to 150 minutes of moderate intensity aerobic activity a week and strengthening exercises 2-3 times per week for cardiovascular health, weight loss maintenance and preservation of muscle mass.  He has agreed to :  Think about enjoyable ways to increase daily physical activity and overcoming barriers to exercise, Increase physical activity in their day and reduce sedentary time (increase NEAT)., Increase volume of physical activity to a goal of 240 minutes a week, and Combine aerobic and strengthening exercises for efficiency and improved cardiometabolic health.  Medical Interventions and Pharmacotherapy  We discussed various medication options to help [facility] with his weight loss efforts and we both agreed to : Increase Zepbound  to 12.5 mg once a week  Associated Conditions Impacted by Obesity Treatment  Assessment & Plan Morbid obesity (HCC) Weight gain of 4 pounds Discussed tracking and journaling today with a target of 2000 cal/day with a protein goal between 120 150 g daily Increase Zepbound  to 12.5 mg once a day Continue metformin  for synergy and diabetes prevention Again emphasized the importance of physical activity he has a high body fat percentage and benefits from engaging in moderate intensity combined exercise. OSA (obstructive sleep apnea) He experiences  oxygen desaturation during sleep. Completed a sleep study but has not yet obtained a CPAP machine due to financial constraints. Emphasized the importance of  treatment to prevent complications such as decreased energy levels and potential life-threatening events. Discussed the possibility of using a used machine or his wife's old machine if compatible. - Encourage obtaining a CPAP machine, possibly considering a used machine or using his wife's old machine if compatible.     Pre-diabetes Patient has not gone to the lab for blood work.  He is currently on metformin  and Zepbound  for pharmacoprophylaxis.  He will go to the lab at his earliest convenience continue current weight management strategy Essential hypertension Blood pressure is at goal on losartan  50 mg without adverse effects.    Continue with weight loss therapy. Losing 10% may improve blood pressure control. Monitor for symptoms of orthostasis while losing weight. Continue current regimen and home monitoring for a goal blood pressure of 120/80.   Check disease monitoring labs         Objective   Physical Exam:  Blood pressure 126/73, pulse 75, temperature 97.7 F (36.5 C), height 5' 6 (1.676 m), weight (!) 430 lb (195 kg), SpO2 98%. Body mass index is 69.4 kg/m.  General: He is overweight, cooperative, alert, well developed, and in no acute distress. PSYCH: Has normal mood, affect and thought process.   HEENT: EOMI, sclerae are anicteric. Lungs: Normal breathing effort, no conversational dyspnea. Extremities: No edema.  Neurologic: No gross sensory or motor deficits. No tremors or fasciculations noted.    Diagnostic Data Reviewed:  BMET    Component Value Date/Time   NA 140 10/20/2022 1627   K 4.5 10/20/2022 1627   CL 100 10/20/2022 1627   CO2 21 10/20/2022 1627   GLUCOSE 101 (H) 10/20/2022 1627   GLUCOSE 118 (H) 03/15/2017 1139   BUN 14 10/20/2022 1627   CREATININE 0.87 10/20/2022 1627   CALCIUM  9.4 10/20/2022 1627   GFRNONAA 103 04/08/2020 0747   GFRAA 119 04/08/2020 0747   Lab Results  Component Value Date   HGBA1C 5.7 (H) 03/18/2023   HGBA1C 5.6  05/03/2017   Lab Results  Component Value Date   INSULIN  34.6 (H) 05/07/2022   Lab Results  Component Value Date   TSH 2.090 05/07/2022   CBC    Component Value Date/Time   WBC 7.4 05/07/2022 0911   WBC 8.2 03/16/2017 0132   RBC 4.78 05/07/2022 0911   RBC 4.56 03/16/2017 0132   HGB 14.2 05/07/2022 0911   HCT 42.5 05/07/2022 0911   PLT 252 05/07/2022 0911   MCV 89 05/07/2022 0911   MCH 29.7 05/07/2022 0911   MCH 30.3 03/16/2017 0132   MCHC 33.4 05/07/2022 0911   MCHC 34.1 03/16/2017 0132   RDW 13.0 05/07/2022 0911   Iron Studies No results found for: IRON, TIBC, FERRITIN, IRONPCTSAT Lipid Panel     Component Value Date/Time   CHOL 135 05/07/2022 0911   TRIG 108 05/07/2022 0911   HDL 44 05/07/2022 0911   CHOLHDL 3.1 05/07/2022 0911   CHOLHDL 5.2 03/16/2017 0132   VLDL 47 (H) 03/16/2017 0132   LDLCALC 71 05/07/2022 0911   Hepatic Function Panel     Component Value Date/Time   PROT 6.3 10/20/2022 1627   ALBUMIN 4.1 10/20/2022 1627   AST 21 10/20/2022 1627   ALT 26 10/20/2022 1627   ALKPHOS 107 10/20/2022 1627   BILITOT 0.7 10/20/2022 1627   BILIDIR 0.13 12/09/2021 1458  Component Value Date/Time   TSH 2.090 05/07/2022 0911   Nutritional Lab Results  Component Value Date   VD25OH 43.8 03/18/2023   VD25OH 26.4 (L) 10/20/2022   VD25OH 32.4 05/07/2022    Medications: Outpatient Encounter Medications as of 04/25/2024  Medication Sig   aspirin  EC 81 MG tablet Take 1 tablet (81 mg total) by mouth daily.   atorvastatin  (LIPITOR ) 80 MG tablet Take 1 tablet (80 mg total) by mouth daily at 6 PM.   Cholecalciferol 125 MCG (5000 UT) capsule Take 5,000 Units by mouth daily.   furosemide  (LASIX ) 40 MG tablet Take 1 tablet (40 mg total) by mouth daily.   losartan  (COZAAR ) 50 MG tablet Take 1 tablet (50 mg total) by mouth daily.   metFORMIN  (GLUCOPHAGE ) 500 MG tablet Take 1 tablet (500 mg total) by mouth 2 (two) times daily with a meal.   metolazone   (ZAROXOLYN ) 2.5 MG tablet Take one tablet once a week 30 minutes before you take the Furosemide  40mg    nitroGLYCERIN  (NITROSTAT ) 0.4 MG SL tablet Place 1 tablet (0.4 mg total) under the tongue every 5 (five) minutes x 3 doses as needed for chest pain.   omega-3 acid ethyl esters (LOVAZA ) 1 g capsule Take 2 capsules (2 g total) by mouth daily.   pantoprazole  (PROTONIX ) 40 MG tablet Take 1 tablet (40 mg total) by mouth daily.   Psyllium 400 MG CAPS Take 400 mg by mouth daily.   tirzepatide  (ZEPBOUND ) 12.5 MG/0.5ML Pen Inject 12.5 mg into the skin once a week.   [DISCONTINUED] tirzepatide  (ZEPBOUND ) 10 MG/0.5ML Pen Inject 10 mg into the skin once a week.   No facility-administered encounter medications on file as of 04/25/2024.     Follow-Up   Return in about 4 weeks (around 05/23/2024) for For Weight Mangement with Dr. Francyne.SABRA He was informed of the importance of frequent follow up visits to maximize his success with intensive lifestyle modifications for his multiple health conditions.  Attestation Statement   Reviewed by clinician on day of visit: allergies, medications, problem list, medical history, surgical history, family history, social history, and previous encounter notes.     Lucas Francyne, MD

## 2024-05-19 ENCOUNTER — Other Ambulatory Visit: Payer: Self-pay

## 2024-05-25 ENCOUNTER — Ambulatory Visit (INDEPENDENT_AMBULATORY_CARE_PROVIDER_SITE_OTHER): Payer: Self-pay | Admitting: Internal Medicine

## 2024-06-12 ENCOUNTER — Telehealth (INDEPENDENT_AMBULATORY_CARE_PROVIDER_SITE_OTHER): Payer: Self-pay | Admitting: Internal Medicine

## 2024-06-12 ENCOUNTER — Encounter (INDEPENDENT_AMBULATORY_CARE_PROVIDER_SITE_OTHER): Payer: Self-pay

## 2024-06-12 NOTE — Telephone Encounter (Signed)
 Patient wants to come in for labs. Pt stated that Dr. Francyne was to place in a lab order. Pateint wants to come in for labs today.

## 2024-06-12 NOTE — Telephone Encounter (Signed)
 Left pt a message

## 2024-06-16 ENCOUNTER — Ambulatory Visit: Admitting: Nurse Practitioner

## 2024-06-19 ENCOUNTER — Encounter (INDEPENDENT_AMBULATORY_CARE_PROVIDER_SITE_OTHER): Payer: Self-pay | Admitting: Internal Medicine

## 2024-06-19 ENCOUNTER — Other Ambulatory Visit: Payer: Self-pay

## 2024-06-19 ENCOUNTER — Ambulatory Visit (INDEPENDENT_AMBULATORY_CARE_PROVIDER_SITE_OTHER): Admitting: Internal Medicine

## 2024-06-19 VITALS — BP 117/72 | HR 65 | Temp 98.0°F | Ht 66.0 in | Wt >= 6400 oz

## 2024-06-19 DIAGNOSIS — Z723 Lack of physical exercise: Secondary | ICD-10-CM

## 2024-06-19 DIAGNOSIS — I10 Essential (primary) hypertension: Secondary | ICD-10-CM

## 2024-06-19 DIAGNOSIS — G4733 Obstructive sleep apnea (adult) (pediatric): Secondary | ICD-10-CM

## 2024-06-19 DIAGNOSIS — R7303 Prediabetes: Secondary | ICD-10-CM

## 2024-06-19 MED ORDER — ZEPBOUND 12.5 MG/0.5ML ~~LOC~~ SOAJ
12.5000 mg | SUBCUTANEOUS | 1 refills | Status: AC
  Filled 2024-06-19: qty 2, 28d supply, fill #0
  Filled 2024-07-27 – 2024-08-01 (×2): qty 2, 28d supply, fill #1

## 2024-06-19 NOTE — Progress Notes (Unsigned)
 Jacob Parker, MD ABIM, ABOM  [address] [place], [place], [place] [address] Office: [phone]  /  Fax: [phone]    Weight Summary and Body Composition Analysis (BIA)  Vitals Temp: 98 F (36.7 C) BP: 117/72 Pulse Rate: 65 SpO2: 96 %   Anthropometric Measurements Height: 5' 6 (1.676 m) Weight: (!) 430 lb (195 kg) BMI (Calculated): 69.44 Weight at Last Visit: 430 lb Weight Lost Since Last Visit: 0 lb Weight Gained Since Last Visit: 0 lb Starting Weight: 465 lb Total Weight Loss (lbs): 35 lb (15.9 kg) Peak Weight: 480 lb   Body Composition  Body Fat %: 56.1 % Fat Mass (lbs): 241.4 lbs Muscle Mass (lbs): 179.6 lbs Visceral Fat Rating : 54    RMR: 2592  Today's Visit #: 25  Starting Date: 05/17/22   Subjective   Chief Complaint: Obesity  Interval History  Jacob Hunt is here to discuss his progress with his obesity treatment plan. He is following {emwtlossplannewint:31639} and states he is following his eating plan approximately {empercentages:32441}% of the time. He states he is {emyesnoexercise:32811}.  [] Denies [] Reports [x]  Improved problems with appetite and hunger signals.  [] Denies [] Reports [x]  Improved problems with satiety and satiation.  [] Denies [] Reports [x]  Improved problems with eating patterns and portion control.  [] Denies [] Reports [x]  Improved abnormal cravings  Discussed the use of AI scribe software for clinical note transcription with the patient, who gave verbal consent to proceed.  History of Present Illness        Challenges affecting patient progress: {EMOBESITYBARRIERS:28841::none}.    Pharmacotherapy for weight management: He is currently taking {EMPharmaco:28845}.   Assessment and Plan   Treatment Plan For Obesity:  Recommended Dietary Goals  Jacob Hunt is currently in the action stage of change. As such, his goal is to continue weight management plan. He has agreed to: {EMWTLOSSPLAN:29297::continue current  plan}  Behavioral Health and Counseling  We discussed the following behavioral modification strategies today: {EMWMwtlossstrategies:28914::continue to work on maintaining a reduced calorie state, getting the recommended amount of protein, incorporating whole foods, making healthy choices, staying well hydrated and practicing mindfulness when eating.,increase protein intake, fibrous foods (25 grams per day for women, 30 grams for men) and water to improve satiety and decrease hunger signals. }.  Additional education and resources provided today: {EMadditionalresources:29169::None}  Recommended Physical Activity Goals  Jacob Hunt has been advised to work up to 150 minutes of moderate intensity aerobic activity a week and strengthening exercises 2-3 times per week for cardiovascular health, weight loss maintenance and preservation of muscle mass.  He has agreed to :  {EMEXERCISE:28847::Think about enjoyable ways to increase daily physical activity and overcoming barriers to exercise,Increase physical activity in their day and reduce sedentary time (increase NEAT).,Increase volume of physical activity to a goal of 240 minutes a week,Combine aerobic and strengthening exercises for efficiency and improved cardiometabolic health.}  Medical Interventions and Pharmacotherapy  We have reviewed current or available treatment options to assist him with their weight loss efforts and we agreed to : {EMagreedrx:29170}  Associated Conditions Impacted by Obesity Treatment  Assessment & Plan Pre-diabetes  Essential hypertension  OSA (obstructive sleep apnea)  Morbid obesity (HCC)     Assessment and Plan Assessment & Plan       No orders of the defined types were placed in this encounter.    Objective   Physical Exam:  Blood pressure 117/72, pulse 65, temperature 98 F (36.7 C), height 5' 6 (1.676 m), weight (!) 430 lb (195 kg), SpO2 96%. Body  mass index is 69.4  kg/m.  General: He is overweight, cooperative, alert, well developed, and in no acute distress. PSYCH: Has normal mood, affect and thought process.   HEENT: EOMI, sclerae are anicteric. Lungs: Normal breathing effort, no conversational dyspnea. Extremities: No edema.  Neurologic: No gross sensory or motor deficits. No tremors or fasciculations noted.    Diagnostic Data Reviewed:  BMET    Component Value Date/Time   NA 140 10/20/2022 1627   K 4.5 10/20/2022 1627   CL 100 10/20/2022 1627   CO2 21 10/20/2022 1627   GLUCOSE 101 (H) 10/20/2022 1627   GLUCOSE 118 (H) 03/15/2017 1139   BUN 14 10/20/2022 1627   CREATININE 0.87 10/20/2022 1627   CALCIUM  9.4 10/20/2022 1627   GFRNONAA 103 04/08/2020 0747   GFRAA 119 04/08/2020 0747   Lab Results  Component Value Date   HGBA1C 5.7 (H) 03/18/2023   HGBA1C 5.6 05/03/2017   Lab Results  Component Value Date   INSULIN  34.6 (H) 05/07/2022   Lab Results  Component Value Date   TSH 2.090 05/07/2022   CBC    Component Value Date/Time   WBC 7.4 05/07/2022 0911   WBC 8.2 03/16/2017 0132   RBC 4.78 05/07/2022 0911   RBC 4.56 03/16/2017 0132   HGB 14.2 05/07/2022 0911   HCT 42.5 05/07/2022 0911   PLT 252 05/07/2022 0911   MCV 89 05/07/2022 0911   MCH 29.7 05/07/2022 0911   MCH 30.3 03/16/2017 0132   MCHC 33.4 05/07/2022 0911   MCHC 34.1 03/16/2017 0132   RDW 13.0 05/07/2022 0911   Iron Studies No results found for: IRON, TIBC, FERRITIN, IRONPCTSAT Lipid Panel     Component Value Date/Time   CHOL 135 05/07/2022 0911   TRIG 108 05/07/2022 0911   HDL 44 05/07/2022 0911   CHOLHDL 3.1 05/07/2022 0911   CHOLHDL 5.2 03/16/2017 0132   VLDL 47 (H) 03/16/2017 0132   LDLCALC 71 05/07/2022 0911   Hepatic Function Panel     Component Value Date/Time   PROT 6.3 10/20/2022 1627   ALBUMIN 4.1 10/20/2022 1627   AST 21 10/20/2022 1627   ALT 26 10/20/2022 1627   ALKPHOS 107 10/20/2022 1627   BILITOT 0.7 10/20/2022 1627    BILIDIR 0.13 12/09/2021 1458      Component Value Date/Time   TSH 2.090 05/07/2022 0911   Nutritional Lab Results  Component Value Date   VD25OH 43.8 03/18/2023   VD25OH 26.4 (L) 10/20/2022   VD25OH 32.4 05/07/2022    Medications: Outpatient Encounter Medications as of 06/19/2024  Medication Sig   aspirin  EC 81 MG tablet Take 1 tablet (81 mg total) by mouth daily.   atorvastatin  (LIPITOR ) 80 MG tablet Take 1 tablet (80 mg total) by mouth daily at 6 PM.   Cholecalciferol 125 MCG (5000 UT) capsule Take 5,000 Units by mouth daily.   furosemide  (LASIX ) 40 MG tablet Take 1 tablet (40 mg total) by mouth daily.   losartan  (COZAAR ) 50 MG tablet Take 1 tablet (50 mg total) by mouth daily.   metFORMIN  (GLUCOPHAGE ) 500 MG tablet Take 1 tablet (500 mg total) by mouth 2 (two) times daily with a meal.   metolazone  (ZAROXOLYN ) 2.5 MG tablet Take one tablet once a week 30 minutes before you take the Furosemide  40mg    nitroGLYCERIN  (NITROSTAT ) 0.4 MG SL tablet Place 1 tablet (0.4 mg total) under the tongue every 5 (five) minutes x 3 doses as needed for chest pain.   omega-3  acid ethyl esters (LOVAZA ) 1 g capsule Take 2 capsules (2 g total) by mouth daily.   pantoprazole  (PROTONIX ) 40 MG tablet Take 1 tablet (40 mg total) by mouth daily.   Psyllium 400 MG CAPS Take 400 mg by mouth daily.   tirzepatide  (ZEPBOUND ) 12.5 MG/0.5ML Pen Inject 12.5 mg into the skin once a week.   No facility-administered encounter medications on file as of 06/19/2024.     Follow-Up   No follow-ups on file.SABRA He was informed of the importance of frequent follow up visits to maximize his success with intensive lifestyle modifications for his multiple health conditions.  Attestation Statement   Reviewed by clinician on day of visit: allergies, medications, problem list, medical history, surgical history, family history, social history, and previous encounter notes.     Jacob Parker, MD ABIM, KENYON

## 2024-06-20 NOTE — Assessment & Plan Note (Signed)
 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  His caloric intake is under his basal metabolic rate.  He has an abnormal metabolic rate due to degree of body fat and low levels of physical activity throughout the day.  We reviewed strategies to overcome some of the barriers and to find ways to increase physical activity throughout the day.

## 2024-06-20 NOTE — Assessment & Plan Note (Signed)
 Weight: decrease of 50.2 lb (10.5%) over 1 year, 5 months  Start: 01/01/2023 480 lb 3.2 oz (217.8 kg) (H)  End: 06/19/2024 430 lb (195 kg) (H) Weight maintenance despite previous weight loss. Current medication (GLP-1) aids appetite control but may contribute to reduced caloric intake. Physical activity is limited, and there is a need to increase muscle mass to boost metabolism. He is motivated to increase physical activity and has access to a fitness center and a track for cycling. - Continue current GLP-1 medication dose. - Encouraged physical activity, including cycling on the track and gym workouts. - Encouraged increasing meal frequency to three small meals a day with adequate protein intake. - Will reassess in six weeks to evaluate weight loss progress and consider medication adjustment if necessary.

## 2024-06-20 NOTE — Assessment & Plan Note (Signed)
 He experiences oxygen desaturation during sleep. Completed a sleep study but has not yet obtained a CPAP machine due to financial constraints. Emphasized the importance of treatment to prevent complications such as decreased energy levels and potential life-threatening events. Discussed the possibility of using a used machine or his wife's old machine if compatible. - Encourage obtaining a CPAP machine, possibly considering a used machine or using his wife's old machine if compatible.

## 2024-06-20 NOTE — Assessment & Plan Note (Signed)
 Blood pressure is at goal on losartan  50 mg without adverse effects.    Continue with weight loss therapy. Losing 10% may improve blood pressure control. Monitor for symptoms of orthostasis while losing weight. Continue current regimen and home monitoring for a goal blood pressure of 120/80.   Check disease monitoring labs

## 2024-06-20 NOTE — Assessment & Plan Note (Signed)
 We are checking disease monitoring labs please refer to orders

## 2024-07-17 ENCOUNTER — Ambulatory Visit: Admitting: Nurse Practitioner

## 2024-07-25 LAB — CMP14+EGFR
ALT: 17 IU/L (ref 0–44)
AST: 17 IU/L (ref 0–40)
Albumin: 4.1 g/dL (ref 3.8–4.9)
Alkaline Phosphatase: 117 IU/L (ref 47–123)
BUN/Creatinine Ratio: 16 (ref 9–20)
BUN: 12 mg/dL (ref 6–24)
Bilirubin Total: 0.7 mg/dL (ref 0.0–1.2)
CO2: 22 mmol/L (ref 20–29)
Calcium: 9.2 mg/dL (ref 8.7–10.2)
Chloride: 100 mmol/L (ref 96–106)
Creatinine, Ser: 0.76 mg/dL (ref 0.76–1.27)
Globulin, Total: 2.3 g/dL (ref 1.5–4.5)
Glucose: 104 mg/dL — ABNORMAL HIGH (ref 70–99)
Potassium: 4.4 mmol/L (ref 3.5–5.2)
Sodium: 139 mmol/L (ref 134–144)
Total Protein: 6.4 g/dL (ref 6.0–8.5)
eGFR: 109 mL/min/1.73

## 2024-07-25 LAB — VITAMIN B12: Vitamin B-12: 456 pg/mL (ref 232–1245)

## 2024-07-25 LAB — CBC WITH DIFFERENTIAL/PLATELET
Basophils Absolute: 0 x10E3/uL (ref 0.0–0.2)
Basos: 1 %
EOS (ABSOLUTE): 0.1 x10E3/uL (ref 0.0–0.4)
Eos: 2 %
Hematocrit: 45.1 % (ref 37.5–51.0)
Hemoglobin: 15.3 g/dL (ref 13.0–17.7)
Immature Grans (Abs): 0 x10E3/uL (ref 0.0–0.1)
Immature Granulocytes: 0 %
Lymphocytes Absolute: 2.5 x10E3/uL (ref 0.7–3.1)
Lymphs: 39 %
MCH: 29.7 pg (ref 26.6–33.0)
MCHC: 33.9 g/dL (ref 31.5–35.7)
MCV: 87 fL (ref 79–97)
Monocytes Absolute: 0.4 x10E3/uL (ref 0.1–0.9)
Monocytes: 7 %
Neutrophils Absolute: 3.3 x10E3/uL (ref 1.4–7.0)
Neutrophils: 51 %
Platelets: 241 x10E3/uL (ref 150–450)
RBC: 5.16 x10E6/uL (ref 4.14–5.80)
RDW: 13.1 % (ref 11.6–15.4)
WBC: 6.4 x10E3/uL (ref 3.4–10.8)

## 2024-07-25 LAB — LIPID PANEL WITH LDL/HDL RATIO
Cholesterol, Total: 143 mg/dL (ref 100–199)
HDL: 46 mg/dL
LDL Chol Calc (NIH): 77 mg/dL (ref 0–99)
LDL/HDL Ratio: 1.7 ratio (ref 0.0–3.6)
Triglycerides: 109 mg/dL (ref 0–149)
VLDL Cholesterol Cal: 20 mg/dL (ref 5–40)

## 2024-07-25 LAB — HEMOGLOBIN A1C
Est. average glucose Bld gHb Est-mCnc: 108 mg/dL
Hgb A1c MFr Bld: 5.4 % (ref 4.8–5.6)

## 2024-07-25 LAB — INSULIN, RANDOM: INSULIN: 65.2 u[IU]/mL — ABNORMAL HIGH (ref 2.6–24.9)

## 2024-07-25 LAB — VITAMIN D 25 HYDROXY (VIT D DEFICIENCY, FRACTURES): Vit D, 25-Hydroxy: 22 ng/mL — ABNORMAL LOW (ref 30.0–100.0)

## 2024-07-25 LAB — TSH: TSH: 2.35 u[IU]/mL (ref 0.450–4.500)

## 2024-07-26 ENCOUNTER — Ambulatory Visit (INDEPENDENT_AMBULATORY_CARE_PROVIDER_SITE_OTHER): Payer: Self-pay | Admitting: Internal Medicine

## 2024-07-27 ENCOUNTER — Other Ambulatory Visit: Payer: Self-pay

## 2024-07-28 ENCOUNTER — Other Ambulatory Visit: Payer: Self-pay

## 2024-07-29 ENCOUNTER — Other Ambulatory Visit: Payer: Self-pay

## 2024-07-31 ENCOUNTER — Ambulatory Visit (INDEPENDENT_AMBULATORY_CARE_PROVIDER_SITE_OTHER): Admitting: Internal Medicine

## 2024-07-31 ENCOUNTER — Other Ambulatory Visit: Payer: Self-pay

## 2024-08-01 ENCOUNTER — Other Ambulatory Visit (HOSPITAL_COMMUNITY): Payer: Self-pay

## 2024-08-01 ENCOUNTER — Telehealth (INDEPENDENT_AMBULATORY_CARE_PROVIDER_SITE_OTHER): Payer: Self-pay

## 2024-08-01 ENCOUNTER — Telehealth: Payer: Self-pay

## 2024-08-01 ENCOUNTER — Other Ambulatory Visit: Payer: Self-pay

## 2024-08-01 NOTE — Telephone Encounter (Signed)
 Pharmacy Patient Advocate Encounter   Received notification from Pt Calls Messages that prior authorization for Zepbound  12.5MG /0.5ML pen-injectors  is required/requested.   Insurance verification completed.   The patient is insured through [facility].   Per test claim: PA required; PA submitted to above mentioned insurance via Latent Key/confirmation #/EOC [id number] Status is pending

## 2024-08-01 NOTE — Telephone Encounter (Signed)
 Pharmacy Patient Advocate Encounter  Received notification from [employer] that Prior Authorization for  Zepbound  12.5MG /0.5ML pen-injectors  has been APPROVED from 08/01/24 to 08/01/25. Ran test claim, Copay is $24.99. This test claim was processed through [facility]- copay amounts may vary at other pharmacies due to pharmacy/plan contracts, or as the patient moves through the different stages of their insurance plan.   PA #/Case ID/Reference #: [id number]

## 2024-08-01 NOTE — Telephone Encounter (Signed)
 Jacob Hunt (Key: [id number]) Zepbound  12.5MG /0.5ML pen-injectors

## 2024-08-02 NOTE — Telephone Encounter (Signed)
 Message from Plan This medication or product was previously approved on [id number] from 2024-08-01 to 2025-08-01.

## 2024-08-08 ENCOUNTER — Telehealth (INDEPENDENT_AMBULATORY_CARE_PROVIDER_SITE_OTHER): Admitting: Internal Medicine

## 2024-08-09 ENCOUNTER — Other Ambulatory Visit: Payer: Self-pay | Admitting: Nurse Practitioner

## 2024-08-09 ENCOUNTER — Ambulatory Visit
Admission: RE | Admit: 2024-08-09 | Discharge: 2024-08-09 | Disposition: A | Source: Ambulatory Visit | Attending: Nurse Practitioner | Admitting: Nurse Practitioner

## 2024-08-09 DIAGNOSIS — S6991XA Unspecified injury of right wrist, hand and finger(s), initial encounter: Secondary | ICD-10-CM

## 2024-08-11 ENCOUNTER — Ambulatory Visit: Admitting: Nurse Practitioner

## 2024-08-23 ENCOUNTER — Ambulatory Visit (INDEPENDENT_AMBULATORY_CARE_PROVIDER_SITE_OTHER): Admitting: Internal Medicine

## 2024-09-20 ENCOUNTER — Ambulatory Visit: Admitting: Nurse Practitioner
